# Patient Record
Sex: Female | Born: 1940
Health system: Southern US, Community
[De-identification: ages and names within clinical notes are randomized; demographics above are authoritative.]

## PROBLEM LIST (undated history)

## (undated) DIAGNOSIS — R51 Headache: Secondary | ICD-10-CM

## (undated) DIAGNOSIS — Z9289 Personal history of other medical treatment: Secondary | ICD-10-CM

## (undated) DIAGNOSIS — T8859XA Other complications of anesthesia, initial encounter: Secondary | ICD-10-CM

## (undated) DIAGNOSIS — K649 Unspecified hemorrhoids: Secondary | ICD-10-CM

## (undated) DIAGNOSIS — I4891 Unspecified atrial fibrillation: Secondary | ICD-10-CM

## (undated) DIAGNOSIS — K449 Diaphragmatic hernia without obstruction or gangrene: Secondary | ICD-10-CM

## (undated) DIAGNOSIS — T753XXA Motion sickness, initial encounter: Secondary | ICD-10-CM

## (undated) DIAGNOSIS — F32A Depression, unspecified: Secondary | ICD-10-CM

## (undated) DIAGNOSIS — M8580 Other specified disorders of bone density and structure, unspecified site: Secondary | ICD-10-CM

## (undated) DIAGNOSIS — M1990 Unspecified osteoarthritis, unspecified site: Secondary | ICD-10-CM

## (undated) DIAGNOSIS — I5030 Unspecified diastolic (congestive) heart failure: Secondary | ICD-10-CM

## (undated) DIAGNOSIS — I499 Cardiac arrhythmia, unspecified: Secondary | ICD-10-CM

## (undated) DIAGNOSIS — Z974 Presence of external hearing-aid: Secondary | ICD-10-CM

## (undated) DIAGNOSIS — Z8669 Personal history of other diseases of the nervous system and sense organs: Secondary | ICD-10-CM

## (undated) DIAGNOSIS — Z972 Presence of dental prosthetic device (complete) (partial): Secondary | ICD-10-CM

## (undated) DIAGNOSIS — E7800 Pure hypercholesterolemia, unspecified: Secondary | ICD-10-CM

## (undated) DIAGNOSIS — N200 Calculus of kidney: Secondary | ICD-10-CM

## (undated) DIAGNOSIS — R112 Nausea with vomiting, unspecified: Secondary | ICD-10-CM

## (undated) DIAGNOSIS — Z8489 Family history of other specified conditions: Secondary | ICD-10-CM

## (undated) DIAGNOSIS — H409 Unspecified glaucoma: Secondary | ICD-10-CM

## (undated) DIAGNOSIS — F329 Major depressive disorder, single episode, unspecified: Secondary | ICD-10-CM

## (undated) DIAGNOSIS — Z9889 Other specified postprocedural states: Secondary | ICD-10-CM

## (undated) DIAGNOSIS — R519 Headache, unspecified: Secondary | ICD-10-CM

## (undated) DIAGNOSIS — G629 Polyneuropathy, unspecified: Secondary | ICD-10-CM

## (undated) DIAGNOSIS — Z8614 Personal history of Methicillin resistant Staphylococcus aureus infection: Secondary | ICD-10-CM

## (undated) DIAGNOSIS — Z87442 Personal history of urinary calculi: Secondary | ICD-10-CM

## (undated) DIAGNOSIS — T4145XA Adverse effect of unspecified anesthetic, initial encounter: Secondary | ICD-10-CM

## (undated) HISTORY — PX: CATARACT EXTRACTION, BILATERAL: SHX1313

## (undated) HISTORY — PX: EYE SURGERY: SHX253

## (undated) HISTORY — PX: ABDOMINAL HYSTERECTOMY: SHX81

## (undated) HISTORY — DX: Calculus of kidney: N20.0

## (undated) HISTORY — DX: Unspecified hemorrhoids: K64.9

## (undated) HISTORY — PX: FOOT SURGERY: SHX648

## (undated) HISTORY — DX: Other specified disorders of bone density and structure, unspecified site: M85.80

## (undated) HISTORY — PX: NASAL SEPTUM SURGERY: SHX37

## (undated) HISTORY — DX: Unspecified glaucoma: H40.9

## (undated) HISTORY — DX: Unspecified diastolic (congestive) heart failure: I50.30

## (undated) HISTORY — PX: TOE SURGERY: SHX1073

## (undated) HISTORY — DX: Diaphragmatic hernia without obstruction or gangrene: K44.9

## (undated) HISTORY — DX: Depression, unspecified: F32.A

## (undated) HISTORY — DX: Unspecified atrial fibrillation: I48.91

## (undated) HISTORY — DX: Unspecified osteoarthritis, unspecified site: M19.90

## (undated) HISTORY — DX: Personal history of other diseases of the nervous system and sense organs: Z86.69

## (undated) HISTORY — PX: JOINT REPLACEMENT: SHX530

## (undated) HISTORY — PX: SHOULDER SURGERY: SHX246

## (undated) HISTORY — PX: REPLACEMENT TOTAL KNEE: SUR1224

## (undated) HISTORY — DX: Major depressive disorder, single episode, unspecified: F32.9

## (undated) HISTORY — DX: Pure hypercholesterolemia, unspecified: E78.00

## (undated) HISTORY — DX: Personal history of other medical treatment: Z92.89

---

## 2000-02-08 ENCOUNTER — Other Ambulatory Visit: Admission: RE | Admit: 2000-02-08 | Discharge: 2000-02-08 | Payer: Self-pay | Admitting: Otolaryngology

## 2000-05-16 ENCOUNTER — Ambulatory Visit (HOSPITAL_COMMUNITY): Admission: RE | Admit: 2000-05-16 | Discharge: 2000-05-16 | Payer: Self-pay | Admitting: Neurosurgery

## 2000-05-16 ENCOUNTER — Encounter: Payer: Self-pay | Admitting: Neurosurgery

## 2000-10-06 ENCOUNTER — Inpatient Hospital Stay (HOSPITAL_COMMUNITY): Admission: RE | Admit: 2000-10-06 | Discharge: 2000-10-07 | Payer: Self-pay | Admitting: Neurosurgery

## 2000-10-06 ENCOUNTER — Encounter: Payer: Self-pay | Admitting: Neurosurgery

## 2000-11-24 ENCOUNTER — Ambulatory Visit (HOSPITAL_COMMUNITY): Admission: RE | Admit: 2000-11-24 | Discharge: 2000-11-24 | Payer: Self-pay | Admitting: Neurosurgery

## 2000-11-24 ENCOUNTER — Encounter: Payer: Self-pay | Admitting: Neurosurgery

## 2001-08-03 ENCOUNTER — Other Ambulatory Visit: Admission: RE | Admit: 2001-08-03 | Discharge: 2001-08-03 | Payer: Self-pay | Admitting: Family Medicine

## 2002-12-06 HISTORY — PX: AUGMENTATION MAMMAPLASTY: SUR837

## 2004-09-09 ENCOUNTER — Ambulatory Visit: Payer: Self-pay

## 2004-11-05 ENCOUNTER — Ambulatory Visit: Payer: Self-pay | Admitting: General Practice

## 2004-12-02 ENCOUNTER — Ambulatory Visit: Payer: Self-pay

## 2005-12-31 ENCOUNTER — Ambulatory Visit: Payer: Self-pay | Admitting: Unknown Physician Specialty

## 2006-01-19 ENCOUNTER — Ambulatory Visit: Payer: Self-pay | Admitting: Internal Medicine

## 2006-02-01 ENCOUNTER — Ambulatory Visit: Payer: Self-pay | Admitting: Unknown Physician Specialty

## 2006-03-04 ENCOUNTER — Ambulatory Visit: Payer: Self-pay

## 2006-06-20 ENCOUNTER — Ambulatory Visit: Payer: Self-pay | Admitting: General Practice

## 2007-03-08 ENCOUNTER — Ambulatory Visit: Payer: Self-pay

## 2007-08-08 ENCOUNTER — Other Ambulatory Visit: Payer: Self-pay

## 2007-08-08 ENCOUNTER — Ambulatory Visit: Payer: Self-pay | Admitting: General Practice

## 2007-08-30 ENCOUNTER — Inpatient Hospital Stay: Payer: Self-pay | Admitting: General Practice

## 2008-03-11 ENCOUNTER — Ambulatory Visit: Payer: Self-pay

## 2009-03-25 ENCOUNTER — Ambulatory Visit: Payer: Self-pay

## 2009-12-29 ENCOUNTER — Ambulatory Visit: Payer: Self-pay | Admitting: Gastroenterology

## 2009-12-29 HISTORY — PX: COLONOSCOPY: SHX174

## 2009-12-29 LAB — HM COLONOSCOPY: HM Colonoscopy: NORMAL

## 2010-01-21 ENCOUNTER — Ambulatory Visit: Payer: Self-pay | Admitting: Family Medicine

## 2010-04-01 ENCOUNTER — Ambulatory Visit: Payer: Self-pay | Admitting: Family Medicine

## 2011-04-15 ENCOUNTER — Ambulatory Visit: Payer: Self-pay | Admitting: Family Medicine

## 2011-08-27 ENCOUNTER — Ambulatory Visit: Payer: Self-pay

## 2011-09-02 ENCOUNTER — Ambulatory Visit: Payer: Self-pay

## 2011-12-14 ENCOUNTER — Ambulatory Visit: Payer: Self-pay | Admitting: General Practice

## 2011-12-14 LAB — PROTIME-INR
INR: 0.9
Prothrombin Time: 12.6 secs (ref 11.5–14.7)

## 2011-12-14 LAB — URINALYSIS, COMPLETE
Bilirubin,UR: NEGATIVE
Blood: NEGATIVE
Glucose,UR: NEGATIVE mg/dL (ref 0–75)
Ketone: NEGATIVE
Leukocyte Esterase: NEGATIVE
Nitrite: NEGATIVE
Ph: 6 (ref 4.5–8.0)
Protein: NEGATIVE
RBC,UR: <1 /HPF (ref 0–5)
Specific Gravity: 1.016 (ref 1.003–1.030)
Squamous Epithelial: 13
WBC UR: <1 /HPF (ref 0–5)

## 2011-12-14 LAB — SEDIMENTATION RATE: Erythrocyte Sed Rate: 12 mm/hr (ref 0–30)

## 2011-12-14 LAB — BASIC METABOLIC PANEL
Anion Gap: 7 (ref 7–16)
BUN: 15 mg/dL (ref 7–18)
Calcium, Total: 9.2 mg/dL (ref 8.5–10.1)
Chloride: 104 mmol/L (ref 98–107)
Co2: 28 mmol/L (ref 21–32)
Creatinine: 0.61 mg/dL (ref 0.60–1.30)
EGFR (African American): >60
EGFR (Non-African Amer.): >60
Glucose: 86 mg/dL (ref 65–99)
Osmolality: 278 (ref 275–301)
Potassium: 4.6 mmol/L (ref 3.5–5.1)
Sodium: 139 mmol/L (ref 136–145)

## 2011-12-14 LAB — APTT: Activated PTT: 25.9 secs (ref 23.6–35.9)

## 2011-12-14 LAB — CBC
HCT: 40.8 % (ref 35.0–47.0)
HGB: 13.7 g/dL (ref 12.0–16.0)
MCH: 31.6 pg (ref 26.0–34.0)
MCHC: 33.5 g/dL (ref 32.0–36.0)
MCV: 94 fL (ref 80–100)
Platelet: 230 10*3/uL (ref 150–440)
RBC: 4.33 10*6/uL (ref 3.80–5.20)
RDW: 12.9 % (ref 11.5–14.5)
WBC: 5.6 10*3/uL (ref 3.6–11.0)

## 2011-12-14 LAB — MRSA PCR SCREENING

## 2011-12-15 LAB — URINE CULTURE

## 2011-12-27 ENCOUNTER — Inpatient Hospital Stay: Payer: Self-pay | Admitting: General Practice

## 2011-12-28 LAB — BASIC METABOLIC PANEL
Anion Gap: 9 (ref 7–16)
BUN: 10 mg/dL (ref 7–18)
Calcium, Total: 7.8 mg/dL — ABNORMAL LOW (ref 8.5–10.1)
Chloride: 105 mmol/L (ref 98–107)
Co2: 27 mmol/L (ref 21–32)
Creatinine: 0.56 mg/dL — ABNORMAL LOW (ref 0.60–1.30)
EGFR (African American): >60
EGFR (Non-African Amer.): >60
Glucose: 120 mg/dL — ABNORMAL HIGH (ref 65–99)
Osmolality: 281 (ref 275–301)
Potassium: 3.9 mmol/L (ref 3.5–5.1)
Sodium: 141 mmol/L (ref 136–145)

## 2011-12-28 LAB — PLATELET COUNT: Platelet: 154 10*3/uL (ref 150–440)

## 2011-12-28 LAB — HEMOGLOBIN: HGB: 11.6 g/dL — ABNORMAL LOW (ref 12.0–16.0)

## 2011-12-29 LAB — HEMOGLOBIN: HGB: 11.3 g/dL — ABNORMAL LOW (ref 12.0–16.0)

## 2011-12-29 LAB — BASIC METABOLIC PANEL
Anion Gap: 9 (ref 7–16)
BUN: 5 mg/dL — ABNORMAL LOW (ref 7–18)
Calcium, Total: 8 mg/dL — ABNORMAL LOW (ref 8.5–10.1)
Chloride: 108 mmol/L — ABNORMAL HIGH (ref 98–107)
Co2: 26 mmol/L (ref 21–32)
Creatinine: 0.57 mg/dL — ABNORMAL LOW (ref 0.60–1.30)
EGFR (African American): >60
EGFR (Non-African Amer.): >60
Glucose: 102 mg/dL — ABNORMAL HIGH (ref 65–99)
Osmolality: 282 (ref 275–301)
Potassium: 3.6 mmol/L (ref 3.5–5.1)
Sodium: 143 mmol/L (ref 136–145)

## 2011-12-29 LAB — PLATELET COUNT: Platelet: 145 10*3/uL — ABNORMAL LOW (ref 150–440)

## 2012-02-01 ENCOUNTER — Ambulatory Visit: Payer: Self-pay | Admitting: Family Medicine

## 2012-04-18 ENCOUNTER — Ambulatory Visit: Payer: Self-pay | Admitting: Family Medicine

## 2013-04-19 ENCOUNTER — Ambulatory Visit: Payer: Self-pay | Admitting: Family Medicine

## 2013-07-05 ENCOUNTER — Encounter: Payer: Self-pay | Admitting: Cardiovascular Disease

## 2013-07-05 ENCOUNTER — Encounter: Payer: Self-pay | Admitting: *Deleted

## 2013-07-05 ENCOUNTER — Ambulatory Visit (INDEPENDENT_AMBULATORY_CARE_PROVIDER_SITE_OTHER): Payer: Medicare HMO | Admitting: Cardiovascular Disease

## 2013-07-05 VITALS — BP 168/92 | HR 55 | Ht 68.0 in | Wt 191.5 lb

## 2013-07-05 DIAGNOSIS — I499 Cardiac arrhythmia, unspecified: Secondary | ICD-10-CM

## 2013-07-05 DIAGNOSIS — R002 Palpitations: Secondary | ICD-10-CM | POA: Insufficient documentation

## 2013-07-05 NOTE — Progress Notes (Signed)
HPI  This is a pleasant 71 year old Caucasian female who is self-referred for evaluation of palpitations and irregular heartbeats. She is not aware of any previous cardiac history. She has no history of hypertension. She reports borderline diabetes and hyperlipidemia not requiring medications. She smoked in the past more than 30 years ago. There is no family history of coronary artery disease or arrhythmia.  Over the last few months, she has experienced palpitations described as skipped beats most noticeable at night when she is trying to sleep. She does not get these symptoms during the day when she is active. This has not been associated with dizziness, syncope or presyncope. She denies any chest pain or dyspnea. She was told at the ophthalmologist office that her heart rate was irregular. She is very active and still works full time with her insurance company. She drinks one can Pepsi occasionally. She does not consume other caffeinated products. She is not aware of thyroid problems.   Allergies  Allergen Reactions  . [name] (Moxifloxacin Hcl In Nacl)   . Keflex (Cephalexin)   . Mobic (Meloxicam)   . Vicodin (Hydrocodone-Acetaminophen)     Sick on stomach     No current outpatient prescriptions on file prior to visit.   No current facility-administered medications on file prior to visit.     Past Medical History  Diagnosis Date  . Arthritis   . Depression   . Hemorrhoids   . History of migraine headaches   . Glaucoma     left eye     Past Surgical History  Procedure Laterality Date  . Replacement total knee      bilateral  . Shoulder surgery      right shoulder  . Abdominal hysterectomy    . Foot surgery      bilateral   . Nasal septum surgery       Family History  Problem Relation Age of Onset  . Family history unknown: Yes     History   Social History  . Marital Status: Married    Spouse Name: N/A    Number of Children: N/A  . Years of Education:  N/A   Occupational History  . Not on file.   Social History Main Topics  . Smoking status: Former Smoker -- 1.00 packs/day for 30 years    Types: Cigarettes  . Smokeless tobacco: Not on file  . Alcohol Use: No  . Drug Use: No  . Sexually Active: Not on file   Other Topics Concern  . Not on file   Social History Narrative  . No narrative on file     ROS Constitutional: Negative for fever, chills, diaphoresis, activity change, appetite change and fatigue.  HENT: Negative for hearing loss, nosebleeds, congestion, sore throat, facial swelling, drooling, trouble swallowing, neck pain, voice change, sinus pressure and tinnitus.  Eyes: Negative for photophobia, pain, discharge and visual disturbance.  Respiratory: Negative for apnea, cough, chest tightness, shortness of breath and wheezing.  Cardiovascular: Negative for chest pain  and leg swelling.  Gastrointestinal: Negative for nausea, vomiting, abdominal pain, diarrhea, constipation, blood in stool and abdominal distention.  Genitourinary: Negative for dysuria, urgency, frequency, hematuria and decreased urine volume.  Musculoskeletal: Negative for myalgias, back pain, joint swelling, arthralgias and gait problem.  Skin: Negative for color change, pallor, rash and wound.  Neurological: Negative for dizziness, tremors, seizures, syncope, speech difficulty, weakness, light-headedness, numbness and headaches.  Psychiatric/Behavioral: Negative for suicidal ideas, hallucinations, behavioral problems and agitation. The  patient is not nervous/anxious.     PHYSICAL EXAM   BP 168/92  Pulse 55  Ht 5\' 8"  (1.727 m)  Wt 191 lb 8 oz (86.864 kg)  BMI 29.12 kg/m2 Constitutional: She is oriented to person, place, and time. She appears well-developed and well-nourished. No distress.  HENT: No nasal discharge.  Head: Normocephalic and atraumatic.  Eyes: Pupils are equal and round. Right eye exhibits no discharge. Left eye exhibits no  discharge.  Neck: Normal range of motion. Neck supple. No JVD present. No thyromegaly present.  Cardiovascular: Normal rate, regular rhythm, normal heart sounds. Exam reveals no gallop and no friction rub. No murmur heard.  Pulmonary/Chest: Effort normal and breath sounds normal. No stridor. No respiratory distress. She has no wheezes. She has no rales. She exhibits no tenderness.  Abdominal: Soft. Bowel sounds are normal. She exhibits no distension. There is no tenderness. There is no rebound and no guarding.  Musculoskeletal: Normal range of motion. She exhibits no edema and no tenderness.  Neurological: She is alert and oriented to person, place, and time. Coordination normal.  Skin: Skin is warm and dry. No rash noted. She is not diaphoretic. No erythema. No pallor.  Psychiatric: She has a normal mood and affect. Her behavior is normal. Judgment and thought content normal.     UJW:[id number]  Bradycardia  Low voltage -possible pulmonary disease.   ABNORMAL    ASSESSMENT AND PLAN

## 2013-07-05 NOTE — Patient Instructions (Addendum)
Labs today.   Your physician has recommended that you wear a holter monitor. Holter monitors are medical devices that record the heart's electrical activity. Doctors most often use these monitors to diagnose arrhythmias. Arrhythmias are problems with the speed or rhythm of the heartbeat. The monitor is a small, portable device. You can wear one while you do your normal daily activities. This is usually used to diagnose what is causing palpitations/syncope (passing out).  Follow up as needed.

## 2013-07-05 NOTE — Assessment & Plan Note (Signed)
Her symptoms are suggestive of premature beats. She has no symptoms suggestive of angina. Physical exam does not reveal any cardiac murmurs or abnormal sounds. Baseline ECG is normal except for mild sinus bradycardia. She does not consume excessive amounts of caffeine. I recommend a 48 hour Holter monitor to ensure no other associated arrhythmia such as atrial fibrillation. I will also check routine labs including basic metabolic profile, TSH and magnesium. Followup as needed based on the results of the above. Overall, I don't think her symptoms are bad enough to warrant treatment with medication especially with her baseline sinus bradycardia.

## 2013-07-06 LAB — TSH: TSH: 1.34 u[IU]/mL (ref 0.450–4.500)

## 2013-07-06 LAB — BASIC METABOLIC PANEL
BUN/Creatinine Ratio: 21 (ref 11–26)
BUN: 15 mg/dL (ref 8–27)
CO2: 26 mmol/L (ref 18–29)
Calcium: 9.7 mg/dL (ref 8.6–10.2)
Chloride: 104 mmol/L (ref 97–108)
Creatinine, Ser: 0.71 mg/dL (ref 0.57–1.00)
GFR calc Af Amer: 99 mL/min/{1.73_m2} (ref >59)
GFR calc non Af Amer: 86 mL/min/{1.73_m2} (ref >59)
Glucose: 88 mg/dL (ref 65–99)
Potassium: 5.3 mmol/L — ABNORMAL HIGH (ref 3.5–5.2)
Sodium: 140 mmol/L (ref 134–144)

## 2013-07-06 LAB — MAGNESIUM: Magnesium: 2.1 mg/dL (ref 1.6–2.6)

## 2013-07-09 NOTE — Progress Notes (Signed)
Pt informed of lab result

## 2013-07-11 ENCOUNTER — Other Ambulatory Visit: Payer: Self-pay

## 2013-07-19 ENCOUNTER — Telehealth: Payer: Self-pay

## 2013-07-19 NOTE — Telephone Encounter (Signed)
Returned call to pt advised Dr Kirke Corin is out of town this week, will return next week.  Holter monitor results are on his desk.  Will have him review after he returns and call her the first of next week with results of holter monitor.  Pt is agreeable to this.

## 2013-07-19 NOTE — Telephone Encounter (Signed)
Pt would like holter results. 

## 2013-07-23 NOTE — Telephone Encounter (Signed)
Holter looks fine with NSR. Only short run of SVT (few seconds). Not long enough to warrant treatment. Follow up as needed if symptoms worsen.

## 2013-07-24 NOTE — Telephone Encounter (Signed)
Returned call to pt advised per Dr Kirke Corin holter monitor looked fine NSR with a short run of SVT.  No treatment warranted at this time.  Follow-up prn if symptoms worsen.  Pt states palpitations seem to be getting better and happening with less frequency.  Pt will continue to monitor.

## 2013-07-25 ENCOUNTER — Encounter (INDEPENDENT_AMBULATORY_CARE_PROVIDER_SITE_OTHER): Payer: Medicare HMO

## 2013-07-25 ENCOUNTER — Other Ambulatory Visit: Payer: Self-pay

## 2013-07-25 DIAGNOSIS — I499 Cardiac arrhythmia, unspecified: Secondary | ICD-10-CM

## 2013-08-10 DIAGNOSIS — J329 Chronic sinusitis, unspecified: Secondary | ICD-10-CM | POA: Insufficient documentation

## 2013-08-10 DIAGNOSIS — R112 Nausea with vomiting, unspecified: Secondary | ICD-10-CM | POA: Insufficient documentation

## 2013-08-10 DIAGNOSIS — M1990 Unspecified osteoarthritis, unspecified site: Secondary | ICD-10-CM | POA: Insufficient documentation

## 2013-08-10 DIAGNOSIS — H409 Unspecified glaucoma: Secondary | ICD-10-CM | POA: Insufficient documentation

## 2013-10-11 ENCOUNTER — Other Ambulatory Visit: Payer: Self-pay

## 2014-02-22 ENCOUNTER — Encounter: Payer: Self-pay | Admitting: Podiatry

## 2014-02-22 ENCOUNTER — Ambulatory Visit (INDEPENDENT_AMBULATORY_CARE_PROVIDER_SITE_OTHER): Payer: Medicare HMO | Admitting: Podiatry

## 2014-02-22 VITALS — Ht 68.0 in | Wt 180.0 lb

## 2014-02-22 DIAGNOSIS — Q828 Other specified congenital malformations of skin: Secondary | ICD-10-CM

## 2014-02-22 NOTE — Progress Notes (Signed)
Subjective:     Patient ID: Teresa Harris, female   DOB: 03-Mar-1941, 73 y.o.   MRN: [id number]  HPI patient presents with plantar calluses on both feet that become tender   Review of Systems     Objective:   Physical Exam Neurovascular status intact with keratotic lesions plantar aspect of both feet    Assessment:     Porokeratotic type lesions plantar aspect both feet    Plan:     Debridement lesions on both feet with no bleeding noted

## 2014-02-22 NOTE — Progress Notes (Signed)
Trim those places

## 2014-05-06 ENCOUNTER — Ambulatory Visit: Payer: Self-pay | Admitting: Family Medicine

## 2014-05-06 LAB — HM MAMMOGRAPHY: HM Mammogram: NORMAL

## 2014-07-16 DIAGNOSIS — M7060 Trochanteric bursitis, unspecified hip: Secondary | ICD-10-CM | POA: Insufficient documentation

## 2014-07-16 DIAGNOSIS — M5416 Radiculopathy, lumbar region: Secondary | ICD-10-CM | POA: Insufficient documentation

## 2014-08-09 ENCOUNTER — Ambulatory Visit (INDEPENDENT_AMBULATORY_CARE_PROVIDER_SITE_OTHER): Payer: Medicare HMO | Admitting: Podiatry

## 2014-08-09 DIAGNOSIS — Q828 Other specified congenital malformations of skin: Secondary | ICD-10-CM

## 2014-08-12 NOTE — Progress Notes (Signed)
Subjective:     Patient ID: Teresa Harris, female   DOB: 1941-08-30, 73 y.o.   MRN: [id number]  HPI patient presents with calluses on both feet that are painful   Review of Systems     Objective:   Physical Exam Neurovascular status unchanged with keratotic lesions of both feet    Assessment:     Chronic lesion formation with pain    Plan:     Debridement lesions on both feet with no bleeding noted

## 2014-10-01 ENCOUNTER — Ambulatory Visit: Payer: Self-pay | Admitting: Family Medicine

## 2014-10-01 DIAGNOSIS — I4821 Permanent atrial fibrillation: Secondary | ICD-10-CM

## 2014-10-01 HISTORY — DX: Permanent atrial fibrillation: I48.21

## 2014-10-03 ENCOUNTER — Ambulatory Visit (INDEPENDENT_AMBULATORY_CARE_PROVIDER_SITE_OTHER): Payer: Medicare HMO | Admitting: Cardiovascular Disease

## 2014-10-03 ENCOUNTER — Encounter: Payer: Self-pay | Admitting: Cardiovascular Disease

## 2014-10-03 ENCOUNTER — Telehealth: Payer: Self-pay | Admitting: Cardiovascular Disease

## 2014-10-03 VITALS — BP 102/80 | HR 107 | Ht 68.0 in | Wt 193.8 lb

## 2014-10-03 DIAGNOSIS — R079 Chest pain, unspecified: Secondary | ICD-10-CM

## 2014-10-03 DIAGNOSIS — I4891 Unspecified atrial fibrillation: Secondary | ICD-10-CM

## 2014-10-03 DIAGNOSIS — K649 Unspecified hemorrhoids: Secondary | ICD-10-CM

## 2014-10-03 MED ORDER — DIGOXIN 125 MCG PO TABS: 0.1250 mg | ORAL_TABLET | Freq: Every day | ORAL | Status: DC

## 2014-10-03 NOTE — Assessment & Plan Note (Signed)
She is having substernal tightness with activities which is concerning for angina. This could be due to atrial fibrillation with rapid ventricular response. However, underlying obstructive coronary artery disease cannot be excluded. I requested a pharmacologic nuclear stress test.

## 2014-10-03 NOTE — Patient Instructions (Addendum)
Your physician has recommended you make the following change in your medication:  Start Digoxin 0.125 mg once daily   Your physician has requested that you have an echocardiogram. Echocardiography is a painless test that uses sound waves to create images of your heart. It provides your doctor with information about the size and shape of your heart and how well your heart's chambers and valves are working. This procedure takes approximately one hour. There are no restrictions for this procedure.  [place]  Your caregiver has ordered a Stress Test with nuclear imaging. The purpose of this test is to evaluate the blood supply to your heart muscle. This procedure is referred to as a "Non-Invasive Stress Test." This is because other than having an IV started in your vein, nothing is inserted or "invades" your body. Cardiac stress tests are done to find areas of poor blood flow to the heart by determining the extent of coronary artery disease (CAD). Some patients exercise on a treadmill, which naturally increases the blood flow to your heart, while others who are  unable to walk on a treadmill due to physical limitations have a pharmacologic/chemical stress agent called Lexiscan . This medicine will mimic walking on a treadmill by temporarily increasing your coronary blood flow.   Please note: these test may take anywhere between 2-4 hours to complete  PLEASE REPORT TO [facility] AT THE FIRST DESK WILL DIRECT YOU WHERE TO GO  Date of Procedure:_______11/02/15______________________________  Arrival Time for Procedure:________0745am______________________  PLEASE NOTIFY THE OFFICE AT LEAST 24 HOURS IN ADVANCE IF YOU ARE UNABLE TO KEEP YOUR APPOINTMENT.  [phone] AND  PLEASE NOTIFY NUCLEAR MEDICINE AT [facility] AT LEAST 24 HOURS IN ADVANCE IF YOU ARE UNABLE TO KEEP YOUR APPOINTMENT. [phone]  How to prepare for your Myoview test:  1. Do not eat or drink after  midnight 2. No caffeine for 24 hours prior to test 3. No smoking 24 hours prior to test. 4. Your medication may be taken with water.  If your doctor stopped a medication because of this test, do not take that medication. 5. Ladies, please do not wear dresses.  Skirts or pants are appropriate. Please wear a short sleeve shirt. 6. No perfume, cologne or lotion. 7. Wear comfortable walking shoes. No heels!       Your physician recommends that you schedule a follow-up appointment in:  1 month with Dr. Zannie Cove have been referred to general surgery  We will call you with appointment date and time     Your next appointment will be scheduled in our new office located at :  [facility]  [address] [place]  [place], [place] [address]

## 2014-10-03 NOTE — Assessment & Plan Note (Signed)
I'm referring her to surgery to treat her bleeding hemorrhoids so that anticoagulation can be started.

## 2014-10-03 NOTE — Progress Notes (Signed)
Primary care provider: Maurine Minister, FNP  HPI  This is a pleasant 73 year old Caucasian female who is here today for a follow-up visit as she was recently diagnosed with atrial fibrillation. I saw her last year for palpitations .  She has no history of hypertension. She reports borderline diabetes and hyperlipidemia not requiring medications. She smoked in the past more than 30 years ago. There is no family history of coronary artery disease or arrhythmia.  She had a Holter monitor done which showed PACs without significant arrhythmia. She reports significant worsening of palpitations over the last 6 months. She feels tachycardia with significant shortness of breath and substernal chest tightness. She has been having symptoms of upper respiratory tract infection and was started on antibiotics. She was found to be in atrial fibrillation recently with a heart rate of 102 bpm. She was started on small dose Toprol. She feels slightly better but continues to have palpitations, dyspnea and chest pain with activities.   Allergies  Allergen Reactions  . [name] [Moxifloxacin Hcl In Nacl]   . Bimatoprost     Other reaction(s): Other (See Comments) Made my eyes red, and hurt  . Brimonidine     Other reaction(s): Other (See Comments) Made my eyes red, and hurt  . Keflex [Cephalexin]     headache  . Mobic [Meloxicam] Other (See Comments)    headache  . Propofol     Other reaction(s): Other (See Comments) Had difficulty waking up  . Travoprost   . Vicodin [Hydrocodone-Acetaminophen] Nausea And Vomiting    Sick on stomach Sick on stomach     Current Outpatient Prescriptions on File Prior to Visit  Medication Sig Dispense Refill  . aspirin 81 MG tablet Take 81 mg by mouth daily.      Marland Kitchen atropine 1 % ophthalmic solution Administer 1 drop into the left eye Two (2) times a day.      . calcium carbonate (OS-CAL) 600 MG TABS Take 600 mg by mouth daily with breakfast.       . celecoxib (CELEBREX) 200  MG capsule Take 200 mg by mouth daily.       . dorzolamide-timolol (COSOPT) 22.3-6.8 MG/ML ophthalmic solution Apply 1 drop to eye 2 (two) times daily.      Marland Kitchen estrogens, conjugated, (PREMARIN) 0.625 MG tablet Take 0.625 mg by mouth daily. Take daily for 21 days then do not take for 7 days.      . Magnesium 400 MG CAPS Take 400 mg by mouth 2 (two) times daily.      . Multiple Vitamin (MULTIVITAMIN) tablet Take 1 tablet by mouth daily.      . Omega-3 Fatty Acids (FISH OIL) 1200 MG CAPS Take 1,200 mg by mouth 2 (two) times daily.      Marland Kitchen omeprazole (PRILOSEC) 40 MG capsule Take 40 mg by mouth daily.       No current facility-administered medications on file prior to visit.     Past Medical History  Diagnosis Date  . Arthritis   . Depression   . Hemorrhoids   . History of migraine headaches   . Glaucoma     left eye  . Hypercholesteremia   . Hiatal hernia   . Kidney calculi   . Osteopenia      Past Surgical History  Procedure Laterality Date  . Replacement total knee      bilateral  . Shoulder surgery      right shoulder  . Abdominal hysterectomy    .  Foot surgery      bilateral   . Nasal septum surgery    . Colonoscopy    . Eye surgery       History reviewed. No pertinent family history.   History   Social History  . Marital Status: Divorced    Spouse Name: N/A    Number of Children: N/A  . Years of Education: N/A   Occupational History  . Not on file.   Social History Main Topics  . Smoking status: Former Smoker -- 1.00 packs/day for 30 years    Types: Cigarettes  . Smokeless tobacco: Not on file  . Alcohol Use: No  . Drug Use: No  . Sexual Activity: Not on file   Other Topics Concern  . Not on file   Social History Narrative  . No narrative on file     ROS A 10 point review of system was performed. It is negative other than that mentioned in the history of present illness.     PHYSICAL EXAM   BP 102/80  Pulse 107  Ht 5\' 8"  (1.727 m)   Wt 193 lb 12 oz (87.884 kg)  BMI 29.47 kg/m2 Constitutional: She is oriented to person, place, and time. She appears well-developed and well-nourished. No distress.  HENT: No nasal discharge.  Head: Normocephalic and atraumatic.  Eyes: Pupils are equal and round. Right eye exhibits no discharge. Left eye exhibits no discharge.  Neck: Normal range of motion. Neck supple. No JVD present. No thyromegaly present.  Cardiovascular: Tachycardic, irregular rhythm, normal heart sounds. Exam reveals no gallop and no friction rub. No murmur heard.  Pulmonary/Chest: Effort normal and breath sounds normal. No stridor. No respiratory distress. She has no wheezes. She has no rales. She exhibits no tenderness.  Abdominal: Soft. Bowel sounds are normal. She exhibits no distension. There is no tenderness. There is no rebound and no guarding.  Musculoskeletal: Normal range of motion. She exhibits no edema and no tenderness.  Neurological: She is alert and oriented to person, place, and time. Coordination normal.  Skin: Skin is warm and dry. No rash noted. She is not diaphoretic. No erythema. No pallor.  Psychiatric: She has a normal mood and affect. Her behavior is normal. Judgment and thought content normal.     TJQ:[id number] fibrillation  -  Nonspecific T-abnormality.   ABNORMAL    ASSESSMENT AND PLAN

## 2014-10-03 NOTE — Telephone Encounter (Signed)
Pt is calling to see if she is okay to take, "celebrax" please call pt back.

## 2014-10-03 NOTE — Assessment & Plan Note (Signed)
The patient is now in atrial fibrillation which is likely that cause of her dyspnea and shortness of breath. Ventricular rate is still not well-controlled. Blood pressure is too low to allow increasing the dose of Toprol. Thus, I added small dose digoxin 0.125 mg once daily. I requested an echocardiogram to evaluate her dyspnea. CHADS2 VASc score is 2 or 3. Thus, long-term anticoagulation is recommended. However, she reports bleeding from hemorrhoids which is a daily occurrence. Thus, I want this to be evaluated before starting anticoagulation. Once the patient is able to take blood thinners, we can proceed with cardioversion after 3 weeks of anticoagulation.

## 2014-10-04 ENCOUNTER — Institutional Professional Consult (permissible substitution): Payer: Medicare HMO | Admitting: Cardiovascular Disease

## 2014-10-04 NOTE — Telephone Encounter (Signed)
Informed patient of Dr. Jacklynn Ganong response  Patient verbalized understanding

## 2014-10-04 NOTE — Telephone Encounter (Signed)
That is fine for now but once we start anticoagulation, she should avoid it.

## 2014-10-04 NOTE — Telephone Encounter (Signed)
LVM 10/30

## 2014-10-07 ENCOUNTER — Ambulatory Visit: Payer: Self-pay | Admitting: Cardiovascular Disease

## 2014-10-07 DIAGNOSIS — R079 Chest pain, unspecified: Secondary | ICD-10-CM

## 2014-10-08 ENCOUNTER — Other Ambulatory Visit: Payer: Self-pay | Admitting: *Deleted

## 2014-10-08 DIAGNOSIS — R079 Chest pain, unspecified: Secondary | ICD-10-CM

## 2014-10-22 ENCOUNTER — Other Ambulatory Visit: Payer: Self-pay

## 2014-10-22 ENCOUNTER — Other Ambulatory Visit (INDEPENDENT_AMBULATORY_CARE_PROVIDER_SITE_OTHER): Payer: Medicare HMO

## 2014-10-22 ENCOUNTER — Telehealth: Payer: Self-pay | Admitting: *Deleted

## 2014-10-22 DIAGNOSIS — I4891 Unspecified atrial fibrillation: Secondary | ICD-10-CM

## 2014-10-22 DIAGNOSIS — R079 Chest pain, unspecified: Secondary | ICD-10-CM

## 2014-10-22 DIAGNOSIS — R0602 Shortness of breath: Secondary | ICD-10-CM

## 2014-10-22 NOTE — Telephone Encounter (Signed)
Patient would like stress test results.  

## 2014-10-23 NOTE — Telephone Encounter (Signed)
Normal stress test

## 2014-10-24 NOTE — Telephone Encounter (Signed)
LVM 1119.

## 2014-10-25 DIAGNOSIS — M5442 Lumbago with sciatica, left side: Secondary | ICD-10-CM | POA: Insufficient documentation

## 2014-10-25 NOTE — Telephone Encounter (Signed)
Reviewed results with patient. 

## 2014-11-04 ENCOUNTER — Ambulatory Visit (INDEPENDENT_AMBULATORY_CARE_PROVIDER_SITE_OTHER): Payer: Medicare HMO | Admitting: Cardiovascular Disease

## 2014-11-04 ENCOUNTER — Telehealth: Payer: Self-pay | Admitting: Cardiovascular Disease

## 2014-11-04 ENCOUNTER — Telehealth: Payer: Self-pay

## 2014-11-04 ENCOUNTER — Encounter: Payer: Self-pay | Admitting: Cardiovascular Disease

## 2014-11-04 ENCOUNTER — Encounter: Payer: Self-pay | Admitting: *Deleted

## 2014-11-04 VITALS — BP 114/70 | HR 80 | Ht 68.0 in | Wt 192.5 lb

## 2014-11-04 DIAGNOSIS — I4891 Unspecified atrial fibrillation: Secondary | ICD-10-CM

## 2014-11-04 DIAGNOSIS — K649 Unspecified hemorrhoids: Secondary | ICD-10-CM

## 2014-11-04 DIAGNOSIS — R0789 Other chest pain: Secondary | ICD-10-CM

## 2014-11-04 MED ORDER — APIXABAN 5 MG PO TABS: 5.0000 mg | ORAL_TABLET | Freq: Two times a day (BID) | ORAL | Status: DC

## 2014-11-04 NOTE — Progress Notes (Signed)
Primary care provider: Maurine Minister, FNP  HPI  This is a pleasant 73 year old Caucasian female who is here today for a follow-up regarding recently diagnosed atrial fibrillation.  She has borderline diabetes and hyperlipidemia not requiring medications. She smoked in the past more than 30 years ago. There is no family history of coronary artery disease or arrhythmia.  She was seen recently for palpitations and chest pain. She was found to be in atrial fibrillation. Heart rate was not well controlled on Toprol with borderline low blood pressure. Thus, I added digoxin. Anticoagulation was indicated. However, it was not started due to chronic problems of bleeding hemorrhoids. I referred her to surgery for evaluation. However, she did not get an appointment yet. She reports improvement in palpitations. She underwent a nuclear stress test which showed no evidence of ischemia with normal ejection fraction. Echocardiogram showed normal EF with mild to moderate mitral regurgitation and mildly dilated left atrium.   Allergies  Allergen Reactions  . [name] [Moxifloxacin Hcl In Nacl]   . Bimatoprost     Other reaction(s): Other (See Comments) Made my eyes red, and hurt  . Brimonidine     Other reaction(s): Other (See Comments) Made my eyes red, and hurt  . Keflex [Cephalexin]     headache  . Mobic [Meloxicam] Other (See Comments)    headache  . Propofol     Other reaction(s): Other (See Comments) Had difficulty waking up  . Travoprost   . Vicodin [Hydrocodone-Acetaminophen] Nausea And Vomiting    Sick on stomach Sick on stomach     Current Outpatient Prescriptions on File Prior to Visit  Medication Sig Dispense Refill  . aspirin 81 MG tablet Take 81 mg by mouth daily.    . calcium carbonate (OS-CAL) 600 MG TABS Take 600 mg by mouth daily with breakfast.     . celecoxib (CELEBREX) 200 MG capsule Take 200 mg by mouth daily.     . cycloSPORINE (RESTASIS) 0.05 % ophthalmic emulsion Place 1  drop into both eyes 2 (two) times daily.     . digoxin (LANOXIN) 0.125 MG tablet Take 1 tablet (0.125 mg total) by mouth daily. 30 tablet 6  . dorzolamide-timolol (COSOPT) 22.3-6.8 MG/ML ophthalmic solution Place 1 drop into the right eye 2 (two) times daily.     Marland Kitchen estrogens, conjugated, (PREMARIN) 0.625 MG tablet Take 0.625 mg by mouth daily. Take daily for 21 days then do not take for 7 days.    Marland Kitchen HYDROcodone-homatropine (HYCODAN) 5-1.5 MG/5ML syrup Take 5 mLs by mouth every 6 (six) hours as needed for cough.    . Magnesium 400 MG CAPS Take 400 mg by mouth 2 (two) times daily.    . metoprolol succinate (TOPROL-XL) 25 MG 24 hr tablet Take 25 mg by mouth daily.    . Multiple Vitamin (MULTIVITAMIN) tablet Take 1 tablet by mouth daily.    . Omega-3 Fatty Acids (FISH OIL) 1200 MG CAPS Take 1,200 mg by mouth 2 (two) times daily.    Marland Kitchen omeprazole (PRILOSEC) 40 MG capsule Take 40 mg by mouth daily.     No current facility-administered medications on file prior to visit.     Past Medical History  Diagnosis Date  . Arthritis   . Depression   . Hemorrhoids   . History of migraine headaches   . Glaucoma     left eye  . Hypercholesteremia   . Hiatal hernia   . Kidney calculi   . Osteopenia  Past Surgical History  Procedure Laterality Date  . Replacement total knee      bilateral  . Shoulder surgery      right shoulder  . Abdominal hysterectomy    . Foot surgery      bilateral   . Nasal septum surgery    . Colonoscopy    . Eye surgery       History reviewed. No pertinent family history.   History   Social History  . Marital Status: Divorced    Spouse Name: N/A    Number of Children: N/A  . Years of Education: N/A   Occupational History  . Not on file.   Social History Main Topics  . Smoking status: Former Smoker -- 1.00 packs/day for 30 years    Types: Cigarettes  . Smokeless tobacco: Not on file  . Alcohol Use: No  . Drug Use: No  . Sexual Activity: Not on  file   Other Topics Concern  . Not on file   Social History Narrative     ROS A 10 point review of system was performed. It is negative other than that mentioned in the history of present illness.     PHYSICAL EXAM   BP 114/70 mmHg  Pulse 80  Ht 5\' 8"  (1.727 m)  Wt 192 lb 8 oz (87.317 kg)  BMI 29.28 kg/m2 Constitutional: She is oriented to person, place, and time. She appears well-developed and well-nourished. No distress.  HENT: No nasal discharge.  Head: Normocephalic and atraumatic.  Eyes: Pupils are equal and round. Right eye exhibits no discharge. Left eye exhibits no discharge.  Neck: Normal range of motion. Neck supple. No JVD present. No thyromegaly present.  Cardiovascular: Tachycardic, irregular rhythm, normal heart sounds. Exam reveals no gallop and no friction rub. No murmur heard.  Pulmonary/Chest: Effort normal and breath sounds normal. No stridor. No respiratory distress. She has no wheezes. She has no rales. She exhibits no tenderness.  Abdominal: Soft. Bowel sounds are normal. She exhibits no distension. There is no tenderness. There is no rebound and no guarding.  Musculoskeletal: Normal range of motion. She exhibits no edema and no tenderness.  Neurological: She is alert and oriented to person, place, and time. Coordination normal.  Skin: Skin is warm and dry. No rash noted. She is not diaphoretic. No erythema. No pallor.  Psychiatric: She has a normal mood and affect. Her behavior is normal. Judgment and thought content normal.     EKG: Atrial fibrillation  -  Negative T-waves  -Possible  Inferior  ischemia.   Low voltage -possible pulmonary disease.   ABNORMAL     ASSESSMENT AND PLAN

## 2014-11-04 NOTE — Telephone Encounter (Signed)
Will forward to Dr Arida for review  

## 2014-11-04 NOTE — Patient Instructions (Signed)
Stop Aspirin 81 mg. Start Eliquis 5 mg Take 1 tablet by mouth Twice daily.   You are having CBC and Basic Metabolic drawn today. You will be contacted with results 24/48 hours.  F/u 1 month with Dr. Fletcher Anon. F/u with Dr. Tollie Pizza for hemorrhoids

## 2014-11-04 NOTE — Telephone Encounter (Signed)
Per pt request, left appt time on vm regarding appt time/date with Dr. Bary Castilla

## 2014-11-04 NOTE — Assessment & Plan Note (Signed)
I'm referring her again to surgery to address the hemorrhoid issue given that she is going to be on long-term anticoagulation.

## 2014-11-04 NOTE — Assessment & Plan Note (Signed)
This was likely due to A. fib with RVR. Nuclear stress test showed no evidence of ischemia.

## 2014-11-04 NOTE — Telephone Encounter (Signed)
Pharmacist called Regarding eliquis, new for pt, pt is on premarin, her system is flagging, increases risk of blood clots. Wanted to know if doctor knew. Please call pharmacy

## 2014-11-04 NOTE — Assessment & Plan Note (Signed)
Ventricular rate is well controlled now on small dose Toprol and digoxin. She reports fatigue with Toprol in spite of being a small dose. I might consider switching to diltiazem in the future. CHADS VASc score is   3. Thus, long-term anticoagulation is recommended. She was supposed to get evaluated by surgery for bleeding hemorrhoids but that has not happened yet. This has been a chronic issue for her. It appears that she tolerates aspirin okay. Thus, she might be fine with anticoagulation. This was discussed with her. I requested routine labs that include CBC and basic metabolic profile.   I stopped aspirin and started Eliquis. I will consider cardioversion in 3-4 weeks if she is tolerating anticoagulation.

## 2014-11-05 LAB — BASIC METABOLIC PANEL
BUN/Creatinine Ratio: 21 (ref 11–26)
BUN: 15 mg/dL (ref 8–27)
CO2: 19 mmol/L (ref 18–29)
Calcium: 9.3 mg/dL (ref 8.7–10.3)
Chloride: 104 mmol/L (ref 97–108)
Creatinine, Ser: 0.7 mg/dL (ref 0.57–1.00)
GFR calc Af Amer: 100 mL/min/{1.73_m2} (ref >59)
GFR calc non Af Amer: 87 mL/min/{1.73_m2} (ref >59)
Glucose: 153 mg/dL — ABNORMAL HIGH (ref 65–99)
Potassium: 4.5 mmol/L (ref 3.5–5.2)
Sodium: 142 mmol/L (ref 134–144)

## 2014-11-05 LAB — CBC WITH DIFFERENTIAL/PLATELET
Basophils Absolute: 0 10*3/uL (ref 0.0–0.2)
Basos: 0 %
Eos: 2 %
Eosinophils Absolute: 0.2 10*3/uL (ref 0.0–0.4)
HCT: 44.6 % (ref 34.0–46.6)
Hemoglobin: 15.2 g/dL (ref 11.1–15.9)
Immature Grans (Abs): 0 10*3/uL (ref 0.0–0.1)
Immature Granulocytes: 0 %
Lymphocytes Absolute: 2.2 10*3/uL (ref 0.7–3.1)
Lymphs: 30 %
MCH: 32.6 pg (ref 26.6–33.0)
MCHC: 34.1 g/dL (ref 31.5–35.7)
MCV: 96 fL (ref 79–97)
Monocytes Absolute: 0.5 10*3/uL (ref 0.1–0.9)
Monocytes: 7 %
Neutrophils Absolute: 4.6 10*3/uL (ref 1.4–7.0)
Neutrophils Relative %: 61 %
RBC: 4.66 x10E6/uL (ref 3.77–5.28)
RDW: 13.9 % (ref 12.3–15.4)
WBC: 7.4 10*3/uL (ref 3.4–10.8)

## 2014-11-05 NOTE — Telephone Encounter (Signed)
Informed Teresa Harris at [facility] drug of Dr. Jacklynn Ganong response  She verbalized understaning

## 2014-11-05 NOTE — Telephone Encounter (Signed)
There is no interaction between the 2 drugs. Eliquis should improve the risk of clotting associated with premarin.

## 2014-11-15 ENCOUNTER — Ambulatory Visit (INDEPENDENT_AMBULATORY_CARE_PROVIDER_SITE_OTHER): Payer: Medicare HMO | Admitting: Podiatry

## 2014-11-15 ENCOUNTER — Encounter: Payer: Self-pay | Admitting: Podiatry

## 2014-11-15 DIAGNOSIS — L84 Corns and callosities: Secondary | ICD-10-CM

## 2014-11-16 NOTE — Progress Notes (Signed)
Subjective:     Patient ID: Teresa Harris, female   DOB: 14-Mar-1941, 73 y.o.   MRN: [id number]  HPI patient presents I have these lesions on the plantar aspect of my right foot which continue to hurt   Review of Systems     Objective:   Physical Exam Neurovascular status intact with keratotic lesion sub-right foot that are painful when pressed    Assessment:     Lesion secondary to structural abnormalities    Plan:     Debridement lesion with no iatrogenic bleeding noted

## 2014-11-18 LAB — LIPID PANEL
Cholesterol: 201 mg/dL — AB (ref 0–200)
HDL: 55 mg/dL (ref 35–70)
LDL Cholesterol: 98 mg/dL
Triglycerides: 240 mg/dL — AB (ref 40–160)

## 2014-11-21 ENCOUNTER — Ambulatory Visit (INDEPENDENT_AMBULATORY_CARE_PROVIDER_SITE_OTHER): Payer: Medicare HMO | Admitting: General Surgery

## 2014-11-21 ENCOUNTER — Encounter: Payer: Self-pay | Admitting: General Surgery

## 2014-11-21 VITALS — BP 130/74 | HR 76 | Resp 12 | Ht 68.0 in | Wt 193.0 lb

## 2014-11-21 DIAGNOSIS — K648 Other hemorrhoids: Secondary | ICD-10-CM

## 2014-11-21 DIAGNOSIS — K625 Hemorrhage of anus and rectum: Secondary | ICD-10-CM

## 2014-11-21 LAB — POC HEMOCCULT BLD/STL (OFFICE/1-CARD/DIAGNOSTIC): Fecal Occult Blood, POC: POSITIVE

## 2014-11-21 NOTE — Patient Instructions (Signed)

## 2014-11-21 NOTE — Progress Notes (Addendum)
Patient ID: Teresa Harris, female   DOB: [birth year], 73 y.o.   MRN: [id number]  Chief Complaint  Patient presents with  . Other    hemorrhoids    HPI Teresa Harris is a 73 y.o. female  Her for evaluation of hemorrhoids. She has had them since the 1970s. She will get some bleeding with bowel movements that is mild. She reports this is not related to stool consistency. She was recently placed on a blood thinner for atrial fibrillation and this has made the bleeding episodes slightly worse. She reports her last colonoscopy about 4 years and was reported as normal aside from her hemorrhoids. She does not report pain with these.  HPI  Past Medical History  Diagnosis Date  . Arthritis   . Depression   . Hemorrhoids   . History of migraine headaches   . Glaucoma     left eye  . Hypercholesteremia   . Hiatal hernia   . Kidney calculi   . Osteopenia   . Atrial fibrillation 10/01/14    Past Surgical History  Procedure Laterality Date  . Replacement total knee      bilateral  . Shoulder surgery      right shoulder  . Abdominal hysterectomy    . Foot surgery      bilateral   . Nasal septum surgery    . Colonoscopy  12/29/09    Dr Dionne Milo  . Eye surgery Left     shunt placed    History reviewed. No pertinent family history.  Social History History  Substance Use Topics  . Smoking status: Former Smoker -- 1.00 packs/day for 30 years    Types: Cigarettes  . Smokeless tobacco: Not on file  . Alcohol Use: No    Allergies  Allergen Reactions  . [name] [Moxifloxacin Hcl In Nacl]   . Bimatoprost     Other reaction(s): Other (See Comments) Made my eyes red, and hurt  . Brimonidine     Other reaction(s): Other (See Comments) Made my eyes red, and hurt  . Keflex [Cephalexin]     headache  . Mobic [Meloxicam] Other (See Comments)    headache  . Propofol     Other reaction(s): Other (See Comments) Had difficulty waking up  . Travoprost   . Vicodin  [Hydrocodone-Acetaminophen] Nausea And Vomiting    Sick on stomach Sick on stomach    Current Outpatient Prescriptions  Medication Sig Dispense Refill  . apixaban (ELIQUIS) 5 MG TABS tablet Take 1 tablet (5 mg total) by mouth 2 (two) times daily. 60 tablet 3  . calcium carbonate (OS-CAL) 600 MG TABS Take 600 mg by mouth daily with breakfast.     . celecoxib (CELEBREX) 200 MG capsule Take 200 mg by mouth daily.     . cycloSPORINE (RESTASIS) 0.05 % ophthalmic emulsion Place 1 drop into both eyes 2 (two) times daily.     . digoxin (LANOXIN) 0.125 MG tablet Take 1 tablet (0.125 mg total) by mouth daily. 30 tablet 6  . dorzolamide-timolol (COSOPT) 22.3-6.8 MG/ML ophthalmic solution Place 1 drop into the right eye 2 (two) times daily.     Marland Kitchen estrogens, conjugated, (PREMARIN) 0.625 MG tablet Take 0.625 mg by mouth daily. Take daily for 21 days then do not take for 7 days.    . Magnesium 400 MG CAPS Take 400 mg by mouth 2 (two) times daily.    . metoprolol succinate (TOPROL-XL) 25 MG 24 hr tablet Take 25 mg by  mouth daily.    . Multiple Vitamin (MULTIVITAMIN) tablet Take 1 tablet by mouth daily.    . Omega-3 Fatty Acids (FISH OIL) 1200 MG CAPS Take 1,200 mg by mouth 2 (two) times daily.    Marland Kitchen omeprazole (PRILOSEC) 40 MG capsule Take 40 mg by mouth daily.     No current facility-administered medications for this visit.    Review of Systems Review of Systems  Constitutional: Negative.   Respiratory: Negative.   Cardiovascular: Negative.   Gastrointestinal: Negative.     Blood pressure 130/74, pulse 76, resp. rate 12, height 5\' 8"  (1.727 m), weight 193 lb (87.544 kg).  Physical Exam Physical Exam  Constitutional: She is oriented to person, place, and time. She appears well-developed and well-nourished.  Eyes: Conjunctivae are normal. No scleral icterus.  Neck: Neck supple.  Cardiovascular: An irregularly irregular rhythm present.  Atrial fibrulation   Pulmonary/Chest: Effort normal and  breath sounds normal.  Genitourinary: Rectal exam shows internal hemorrhoid (Fullness on the right anterior rectal wall.). Rectal exam shows no external hemorrhoid. Guaiac positive stool.  Anoscopy shows a multilobulated lesion less than a centimeter in diameter associated with an internal hemorrhoid. This accounts for the palpable finding.  Lymphadenopathy:    She has no cervical adenopathy.  Neurological: She is alert and oriented to person, place, and time.    Assessment    Internal hemorrhoid with bleeding.    Plan    The patient had a colonoscopy completed on 12/29/2009. This was a screening exam. Internal hemorrhoids and diverticulosis were noted. 10 year follow-up recommended. Completed by Dr. Dionne Milo.  Considering the multilobulated nature of the area, while likely all chronic inflammation, I recommended excision and then rubber band application for control bleeding.  The patient is scheduled to meet with her cardiologist on December 30. We'll ask if the patient can come off her Eliquis for 48 hours for the procedure to be completed.    PCP: Dr Margarita Rana Ref:  Dr. Kathlyn [place]  Robert Bellow 11/23/2014, 8:48 AM

## 2014-11-23 DIAGNOSIS — K625 Hemorrhage of anus and rectum: Secondary | ICD-10-CM | POA: Insufficient documentation

## 2014-11-23 DIAGNOSIS — K648 Other hemorrhoids: Secondary | ICD-10-CM | POA: Insufficient documentation

## 2014-12-04 ENCOUNTER — Ambulatory Visit (INDEPENDENT_AMBULATORY_CARE_PROVIDER_SITE_OTHER): Payer: Medicare HMO | Admitting: Cardiovascular Disease

## 2014-12-04 ENCOUNTER — Encounter: Payer: Self-pay | Admitting: Cardiovascular Disease

## 2014-12-04 VITALS — BP 110/78 | HR 88 | Ht 68.0 in | Wt 194.8 lb

## 2014-12-04 DIAGNOSIS — K648 Other hemorrhoids: Secondary | ICD-10-CM

## 2014-12-04 DIAGNOSIS — I4891 Unspecified atrial fibrillation: Secondary | ICD-10-CM

## 2014-12-04 NOTE — Assessment & Plan Note (Signed)
She is doing reasonably well with rate control. However, she continues to complain of fatigue. This improved slightly after decreasing the dose of Toprol. Continue current medications for now as well as anticoagulation with Eliquis. This can be held 2 days before the surgery. I will consider cardioversion after the surgery.

## 2014-12-04 NOTE — Assessment & Plan Note (Signed)
She is scheduled for surgery next month. She is overall at low risk for cardiac complications.

## 2014-12-04 NOTE — Progress Notes (Signed)
Primary care provider: Maurine Minister, FNP  HPI  This is a pleasant 73 year old Caucasian female who is here today for a follow-up regarding  atrial fibrillation.  She has borderline diabetes and hyperlipidemia not requiring medications. She smoked in the past more than 30 years ago. There is no family history of coronary artery disease or arrhythmia.  She was seen recently for palpitations and chest pain. She was found to be in atrial fibrillation. Heart rate was not well controlled on Toprol with borderline low blood pressure. Thus, I added digoxin.She underwent a nuclear stress test which showed no evidence of ischemia with normal ejection fraction. Echocardiogram showed normal EF with mild to moderate mitral regurgitation and mildly dilated left atrium. She was seen by Dr. Charissa Bash for evaluations hemorrhoids. She is scheduled for surgery next month. She reports worsening bleeding since she was started on Eliquis. She continues to feel tired and fatigued especially after she takes Toprol. The dose was decreased by half.    Allergies  Allergen Reactions  . [name] [Moxifloxacin Hcl In Nacl]   . Bimatoprost     Other reaction(s): Other (See Comments) Made my eyes red, and hurt  . Brimonidine     Other reaction(s): Other (See Comments) Made my eyes red, and hurt  . Keflex [Cephalexin]     headache  . Mobic [Meloxicam] Other (See Comments)    headache  . Propofol     Other reaction(s): Other (See Comments) Had difficulty waking up  . Travoprost   . Vicodin [Hydrocodone-Acetaminophen] Nausea And Vomiting    Sick on stomach Sick on stomach     Current Outpatient Prescriptions on File Prior to Visit  Medication Sig Dispense Refill  . apixaban (ELIQUIS) 5 MG TABS tablet Take 1 tablet (5 mg total) by mouth 2 (two) times daily. 60 tablet 3  . calcium carbonate (OS-CAL) 600 MG TABS Take 600 mg by mouth daily with breakfast.     . celecoxib (CELEBREX) 200 MG capsule Take 200 mg by mouth  daily.     . cycloSPORINE (RESTASIS) 0.05 % ophthalmic emulsion Place 1 drop into both eyes 2 (two) times daily.     . digoxin (LANOXIN) 0.125 MG tablet Take 1 tablet (0.125 mg total) by mouth daily. 30 tablet 6  . dorzolamide-timolol (COSOPT) 22.3-6.8 MG/ML ophthalmic solution Place 1 drop into the right eye 2 (two) times daily.     Marland Kitchen estrogens, conjugated, (PREMARIN) 0.625 MG tablet Take 0.625 mg by mouth daily.     . Magnesium 400 MG CAPS Take 400 mg by mouth 2 (two) times daily.    . metoprolol succinate (TOPROL-XL) 25 MG 24 hr tablet Take 12.5 mg by mouth daily.     . Multiple Vitamin (MULTIVITAMIN) tablet Take 1 tablet by mouth daily.    . Omega-3 Fatty Acids (FISH OIL) 1200 MG CAPS Take 1,200 mg by mouth 2 (two) times daily.    Marland Kitchen omeprazole (PRILOSEC) 40 MG capsule Take 40 mg by mouth daily.     No current facility-administered medications on file prior to visit.     Past Medical History  Diagnosis Date  . Arthritis   . Depression   . Hemorrhoids   . History of migraine headaches   . Glaucoma     left eye  . Hypercholesteremia   . Hiatal hernia   . Kidney calculi   . Osteopenia   . Atrial fibrillation 10/01/14     Past Surgical History  Procedure Laterality Date  .  Replacement total knee      bilateral  . Shoulder surgery      right shoulder  . Abdominal hysterectomy    . Foot surgery      bilateral   . Nasal septum surgery    . Colonoscopy  12/29/09    Dr Dionne Milo  . Eye surgery Left     shunt placed     History reviewed. No pertinent family history.   History   Social History  . Marital Status: Divorced    Spouse Name: N/A    Number of Children: N/A  . Years of Education: N/A   Occupational History  . Not on file.   Social History Main Topics  . Smoking status: Former Smoker -- 1.00 packs/day for 30 years    Types: Cigarettes  . Smokeless tobacco: Not on file  . Alcohol Use: No  . Drug Use: No  . Sexual Activity: Not on file   Other Topics  Concern  . Not on file   Social History Narrative     ROS A 10 point review of system was performed. It is negative other than that mentioned in the history of present illness.     PHYSICAL EXAM   BP 110/78 mmHg  Pulse 88  Ht 5\' 8"  (1.727 m)  Wt 194 lb 12 oz (88.338 kg)  BMI 29.62 kg/m2 Constitutional: She is oriented to person, place, and time. She appears well-developed and well-nourished. No distress.  HENT: No nasal discharge.  Head: Normocephalic and atraumatic.  Eyes: Pupils are equal and round. Right eye exhibits no discharge. Left eye exhibits no discharge.  Neck: Normal range of motion. Neck supple. No JVD present. No thyromegaly present.  Cardiovascular: Tachycardic, irregular rhythm, normal heart sounds. Exam reveals no gallop and no friction rub. No murmur heard.  Pulmonary/Chest: Effort normal and breath sounds normal. No stridor. No respiratory distress. She has no wheezes. She has no rales. She exhibits no tenderness.  Abdominal: Soft. Bowel sounds are normal. She exhibits no distension. There is no tenderness. There is no rebound and no guarding.  Musculoskeletal: Normal range of motion. She exhibits no edema and no tenderness.  Neurological: She is alert and oriented to person, place, and time. Coordination normal.  Skin: Skin is warm and dry. No rash noted. She is not diaphoretic. No erythema. No pallor.  Psychiatric: She has a normal mood and affect. Her behavior is normal. Judgment and thought content normal.     EKG: Atrial fibrillation  -Nonspecific ST depression   +   Diffuse nonspecific T-abnormality  -Nondiagnostic.   Low voltage -possible pulmonary disease.   ABNORMAL     ASSESSMENT AND PLAN

## 2014-12-04 NOTE — Patient Instructions (Signed)
Continue same medications.   Hold Eliquis 2 days before the surgery.   Follow up in 2 months.

## 2015-01-02 ENCOUNTER — Encounter: Payer: Self-pay | Admitting: General Surgery

## 2015-01-02 ENCOUNTER — Ambulatory Visit (INDEPENDENT_AMBULATORY_CARE_PROVIDER_SITE_OTHER): Payer: BLUE CROSS/BLUE SHIELD | Admitting: General Surgery

## 2015-01-02 VITALS — BP 124/68 | HR 76 | Resp 14 | Ht 68.0 in | Wt 191.0 lb

## 2015-01-02 DIAGNOSIS — K648 Other hemorrhoids: Secondary | ICD-10-CM

## 2015-01-02 NOTE — Progress Notes (Signed)
Patient ID: Teresa Harris, female   DOB: 1941-04-10, 74 y.o.   MRN: [id number]  Chief Complaint  Patient presents with  . Procedure    excision of hemorrhoid    HPI Teresa Harris is a 74 y.o. female who presents for an excision of her bleeding internal hemorrhoid. She denies any new issues at this time, but does report ongoing bleeding with defecation. Permission to hold her Eliquis  for 48 hours was received from her cardiologist.    HPI  Past Medical History  Diagnosis Date  . Arthritis   . Depression   . Hemorrhoids   . History of migraine headaches   . Glaucoma     left eye  . Hypercholesteremia   . Hiatal hernia   . Kidney calculi   . Osteopenia   . Atrial fibrillation 10/01/14    Past Surgical History  Procedure Laterality Date  . Replacement total knee      bilateral  . Shoulder surgery      right shoulder  . Abdominal hysterectomy    . Foot surgery      bilateral   . Nasal septum surgery    . Colonoscopy  12/29/09    Dr Dionne Milo  . Eye surgery Left     shunt placed    No family history on file.  Social History History  Substance Use Topics  . Smoking status: Former Smoker -- 1.00 packs/day for 30 years    Types: Cigarettes  . Smokeless tobacco: Not on file  . Alcohol Use: No    Allergies  Allergen Reactions  . [name] [Moxifloxacin Hcl In Nacl]   . Bimatoprost     Other reaction(s): Other (See Comments) Made my eyes red, and hurt  . Brimonidine     Other reaction(s): Other (See Comments) Made my eyes red, and hurt  . Keflex [Cephalexin]     headache  . Mobic [Meloxicam] Other (See Comments)    headache  . Propofol     Other reaction(s): Other (See Comments) Had difficulty waking up  . Travoprost   . Vicodin [Hydrocodone-Acetaminophen] Nausea And Vomiting    Sick on stomach Sick on stomach    Current Outpatient Prescriptions  Medication Sig Dispense Refill  . apixaban (ELIQUIS) 5 MG TABS tablet Take 1 tablet (5 mg total) by  mouth 2 (two) times daily. 60 tablet 3  . calcium carbonate (OS-CAL) 600 MG TABS Take 600 mg by mouth daily with breakfast.     . celecoxib (CELEBREX) 200 MG capsule Take 200 mg by mouth daily.     . cycloSPORINE (RESTASIS) 0.05 % ophthalmic emulsion Place 1 drop into both eyes 2 (two) times daily.     . digoxin (LANOXIN) 0.125 MG tablet Take 1 tablet (0.125 mg total) by mouth daily. 30 tablet 6  . dorzolamide-timolol (COSOPT) 22.3-6.8 MG/ML ophthalmic solution Place 1 drop into the right eye 2 (two) times daily.     Marland Kitchen estrogens, conjugated, (PREMARIN) 0.625 MG tablet Take 0.625 mg by mouth daily.     . Magnesium 400 MG CAPS Take 400 mg by mouth 2 (two) times daily.    . metoprolol succinate (TOPROL-XL) 25 MG 24 hr tablet Take 12.5 mg by mouth daily.     . Multiple Vitamin (MULTIVITAMIN) tablet Take 1 tablet by mouth daily.    . Omega-3 Fatty Acids (FISH OIL) 1200 MG CAPS Take 1,200 mg by mouth 2 (two) times daily.    Marland Kitchen omeprazole (PRILOSEC) 40 MG  capsule Take 40 mg by mouth daily.     No current facility-administered medications for this visit.    Review of Systems Review of Systems  Constitutional: Negative.   Respiratory: Negative.   Cardiovascular: Negative.     Blood pressure 124/68, pulse 76, resp. rate 14, height 5\' 8"  (1.727 m), weight 191 lb (86.637 kg).  Physical Exam Physical Exam External anal exam was unremarkable. Anoscopy was completed on the right anterior wall the prominent internal hemorrhoid with a 1 cm lobulated mass was again identified. As she maintained Valsalva during extraction of the scope the mass was exposed. This was grasped with forceps and found to be asensate.     Assessment    Internal hemorrhoid with focal inflammation secondary to recurrent prolapse.    Plan    A double rubber band ligation was applied to the base of the internal hemorrhoid with transient discomfort during application with rapid resolution. The inflammatory component as well as  some of the hemorrhoid was sharply excised area and no bleeding was noted. The excised portion was sent for histology. The patient was observed for 10 minutes and experienced no pain or bleeding.  She was advised that she may well see a drop of blood over the next several days. She will resume her Eliquis tomorrow. She was encouraged to report any unusual bleeding promptly.  We will plan for follow-up exam in 3 weeks to reassess the area. She'll be contacted when pathology is available.    PCP: No PCP per patient    Robert Bellow 01/02/2015, 11:17 AM

## 2015-01-02 NOTE — Patient Instructions (Addendum)
Patient to return in 3 weeks for follow up. The patient is aware to call back for any questions or concerns.

## 2015-01-22 ENCOUNTER — Encounter: Payer: Self-pay | Admitting: General Surgery

## 2015-01-22 ENCOUNTER — Ambulatory Visit (INDEPENDENT_AMBULATORY_CARE_PROVIDER_SITE_OTHER): Payer: BLUE CROSS/BLUE SHIELD | Admitting: General Surgery

## 2015-01-22 VITALS — BP 130/72 | HR 74 | Resp 12 | Ht 68.0 in | Wt 192.0 lb

## 2015-01-22 DIAGNOSIS — K648 Other hemorrhoids: Secondary | ICD-10-CM

## 2015-01-22 NOTE — Progress Notes (Signed)
Patient ID: Teresa Harris, female   DOB: 1941/04/03, 74 y.o.   MRN: [id number]  Chief Complaint  Patient presents with  . Follow-up    3 week follow up hemorrhoids    HPI Teresa Harris is a 74 y.o. female who presents for a 3 week follow up of hemorrhoids. Patient states she is doing well. She states she is not having any more bleeding.   HPI  Past Medical History  Diagnosis Date  . Arthritis   . Depression   . Hemorrhoids   . History of migraine headaches   . Glaucoma     left eye  . Hypercholesteremia   . Hiatal hernia   . Kidney calculi   . Osteopenia   . Atrial fibrillation 10/01/14    Past Surgical History  Procedure Laterality Date  . Replacement total knee      bilateral  . Shoulder surgery      right shoulder  . Abdominal hysterectomy    . Foot surgery      bilateral   . Nasal septum surgery    . Colonoscopy  12/29/09    Dr Dionne Milo  . Eye surgery Left     shunt placed    History reviewed. No pertinent family history.  Social History History  Substance Use Topics  . Smoking status: Former Smoker -- 1.00 packs/day for 30 years    Types: Cigarettes  . Smokeless tobacco: Not on file  . Alcohol Use: No    Allergies  Allergen Reactions  . [name] [Moxifloxacin Hcl In Nacl]   . Bimatoprost     Other reaction(s): Other (See Comments) Made my eyes red, and hurt  . Brimonidine     Other reaction(s): Other (See Comments) Made my eyes red, and hurt  . Keflex [Cephalexin]     headache  . Mobic [Meloxicam] Other (See Comments)    headache  . Propofol     Other reaction(s): Other (See Comments) Had difficulty waking up  . Travoprost   . Vicodin [Hydrocodone-Acetaminophen] Nausea And Vomiting    Sick on stomach Sick on stomach    Current Outpatient Prescriptions  Medication Sig Dispense Refill  . apixaban (ELIQUIS) 5 MG TABS tablet Take 1 tablet (5 mg total) by mouth 2 (two) times daily. 60 tablet 3  . calcium carbonate (OS-CAL) 600 MG  TABS Take 600 mg by mouth daily with breakfast.     . celecoxib (CELEBREX) 200 MG capsule Take 200 mg by mouth daily.     . cycloSPORINE (RESTASIS) 0.05 % ophthalmic emulsion Place 1 drop into both eyes 2 (two) times daily.     . digoxin (LANOXIN) 0.125 MG tablet Take 1 tablet (0.125 mg total) by mouth daily. 30 tablet 6  . dorzolamide-timolol (COSOPT) 22.3-6.8 MG/ML ophthalmic solution Place 1 drop into the right eye 2 (two) times daily.     Marland Kitchen estrogens, conjugated, (PREMARIN) 0.625 MG tablet Take 0.625 mg by mouth daily.     . Magnesium 400 MG CAPS Take 400 mg by mouth 2 (two) times daily.    . metoprolol succinate (TOPROL-XL) 25 MG 24 hr tablet Take 12.5 mg by mouth daily.     . Multiple Vitamin (MULTIVITAMIN) tablet Take 1 tablet by mouth daily.    . Omega-3 Fatty Acids (FISH OIL) 1200 MG CAPS Take 1,200 mg by mouth 2 (two) times daily.    Marland Kitchen omeprazole (PRILOSEC) 40 MG capsule Take 40 mg by mouth daily.     No  current facility-administered medications for this visit.    Review of Systems Review of Systems  Constitutional: Negative.   Respiratory: Negative.   Cardiovascular: Negative.   Gastrointestinal: Negative.     Blood pressure 130/72, pulse 74, resp. rate 12, height 5\' 8"  (1.727 m), weight 192 lb (87.091 kg).  Physical Exam Physical Exam External anal exam shows no prolapsing tissue. No bleeding.  Data Reviewed Pathology showed evidence of an internal hemorrhoid. No malignancy.  Assessment    Resolution of rectal bleeding with removal of internal hemorrhoid.    Plan    The patient reports that she had been told by Dolores Frame, NP that she needed to consider having a bladder repair. She denies urinary incontinence.  I've recommended that she have a GYN exam (previously seen by Dr. Verlene Mayer in the distant past) sees for formal assessment. I spoke with Dr. Laurey Morale, and unless the patient is symptomatic or has other issues, surgical intervention would be unlikely.  He was willing to see the patient and offer a formal opinion.  I offered to make her an appointment, but she reported that she's been deferred at this time, just need to know which direction go when she is ready for formal evaluation.    PCP:  No Pcp    Robert Bellow 01/22/2015, 9:38 PM

## 2015-01-22 NOTE — Patient Instructions (Signed)
Patient to return as needed. 

## 2015-02-04 ENCOUNTER — Encounter: Payer: Self-pay | Admitting: Cardiovascular Disease

## 2015-02-04 ENCOUNTER — Ambulatory Visit (INDEPENDENT_AMBULATORY_CARE_PROVIDER_SITE_OTHER): Payer: BLUE CROSS/BLUE SHIELD | Admitting: Cardiovascular Disease

## 2015-02-04 VITALS — BP 114/98 | HR 78 | Ht 68.0 in | Wt 189.5 lb

## 2015-02-04 DIAGNOSIS — I4891 Unspecified atrial fibrillation: Secondary | ICD-10-CM

## 2015-02-04 NOTE — Patient Instructions (Addendum)
Continue same medications.   Your physician wants you to follow-up in: 6 months.  You will receive a reminder letter in the mail two months in advance. If you don't receive a letter, please call our office to schedule the follow-up appointment.  

## 2015-02-04 NOTE — Progress Notes (Signed)
Primary care provider: Maurine Minister, FNP  HPI  This is a pleasant 74 year old Caucasian female who is here today for a follow-up regarding  atrial fibrillation.  She has borderline diabetes and hyperlipidemia not requiring medications. She smoked in the past more than 30 years ago. There is no family history of coronary artery disease or arrhythmia.  She was last year for palpitations and chest pain. She was found to be in atrial fibrillation. Heart rate was not well controlled on Toprol with borderline low blood pressure. Thus, I added digoxin.She underwent a nuclear stress test which showed no evidence of ischemia with normal ejection fraction. Echocardiogram showed normal EF with mild to moderate mitral regurgitation and mildly dilated left atrium. She underwent hemorrhoid surgery by Dr. Charissa Bash without complications. She denies any bleeding issues with anticoagulation.  Allergies  Allergen Reactions  . [name] [Moxifloxacin Hcl In Nacl]   . Bimatoprost     Other reaction(s): Other (See Comments) Made my eyes red, and hurt  . Brimonidine     Other reaction(s): Other (See Comments) Made my eyes red, and hurt  . Keflex [Cephalexin]     headache  . Mobic [Meloxicam] Other (See Comments)    headache  . Propofol     Other reaction(s): Other (See Comments) Had difficulty waking up  . Travoprost   . Vicodin [Hydrocodone-Acetaminophen] Nausea And Vomiting    Sick on stomach Sick on stomach     Current Outpatient Prescriptions on File Prior to Visit  Medication Sig Dispense Refill  . apixaban (ELIQUIS) 5 MG TABS tablet Take 1 tablet (5 mg total) by mouth 2 (two) times daily. 60 tablet 3  . calcium carbonate (OS-CAL) 600 MG TABS Take 600 mg by mouth daily with breakfast.     . celecoxib (CELEBREX) 200 MG capsule Take 200 mg by mouth daily.     . cycloSPORINE (RESTASIS) 0.05 % ophthalmic emulsion Place 1 drop into both eyes 2 (two) times daily.     . digoxin (LANOXIN) 0.125 MG tablet  Take 1 tablet (0.125 mg total) by mouth daily. (Patient taking differently: Take 0.0625 mg by mouth daily. ) 30 tablet 6  . dorzolamide-timolol (COSOPT) 22.3-6.8 MG/ML ophthalmic solution Place 1 drop into the right eye 2 (two) times daily.     Marland Kitchen estrogens, conjugated, (PREMARIN) 0.625 MG tablet Take 0.625 mg by mouth daily.     . Magnesium 400 MG CAPS Take 400 mg by mouth 2 (two) times daily.    . metoprolol succinate (TOPROL-XL) 25 MG 24 hr tablet Take 12.5 mg by mouth daily.     . Multiple Vitamin (MULTIVITAMIN) tablet Take 1 tablet by mouth daily.    . Omega-3 Fatty Acids (FISH OIL) 1200 MG CAPS Take 1,200 mg by mouth 2 (two) times daily.    Marland Kitchen omeprazole (PRILOSEC) 40 MG capsule Take 40 mg by mouth daily.     No current facility-administered medications on file prior to visit.     Past Medical History  Diagnosis Date  . Arthritis   . Depression   . Hemorrhoids   . History of migraine headaches   . Glaucoma     left eye  . Hypercholesteremia   . Hiatal hernia   . Kidney calculi   . Osteopenia   . Atrial fibrillation 10/01/14     Past Surgical History  Procedure Laterality Date  . Replacement total knee      bilateral  . Shoulder surgery      right shoulder  .  Abdominal hysterectomy    . Foot surgery      bilateral   . Nasal septum surgery    . Colonoscopy  12/29/09    Dr Dionne Milo  . Eye surgery Left     shunt placed  . Hemorrhoid surgery      Removed Growth     History reviewed. No pertinent family history.   History   Social History  . Marital Status: Divorced    Spouse Name: N/A  . Number of Children: N/A  . Years of Education: N/A   Occupational History  . Not on file.   Social History Main Topics  . Smoking status: Former Smoker -- 1.00 packs/day for 30 years    Types: Cigarettes  . Smokeless tobacco: Not on file  . Alcohol Use: No  . Drug Use: No  . Sexual Activity: Not on file   Other Topics Concern  . Not on file   Social History  Narrative     ROS A 10 point review of system was performed. It is negative other than that mentioned in the history of present illness.     PHYSICAL EXAM   BP 114/98 mmHg  Pulse 78  Ht 5\' 8"  (1.727 m)  Wt 189 lb 8 oz (85.957 kg)  BMI 28.82 kg/m2 Constitutional: She is oriented to person, place, and time. She appears well-developed and well-nourished. No distress.  HENT: No nasal discharge.  Head: Normocephalic and atraumatic.  Eyes: Pupils are equal and round. Right eye exhibits no discharge. Left eye exhibits no discharge.  Neck: Normal range of motion. Neck supple. No JVD present. No thyromegaly present.  Cardiovascular: Tachycardic, irregular rhythm, normal heart sounds. Exam reveals no gallop and no friction rub. No murmur heard.  Pulmonary/Chest: Effort normal and breath sounds normal. No stridor. No respiratory distress. She has no wheezes. She has no rales. She exhibits no tenderness.  Abdominal: Soft. Bowel sounds are normal. She exhibits no distension. There is no tenderness. There is no rebound and no guarding.  Musculoskeletal: Normal range of motion. She exhibits no edema and no tenderness.  Neurological: She is alert and oriented to person, place, and time. Coordination normal.  Skin: Skin is warm and dry. No rash noted. She is not diaphoretic. No erythema. No pallor.  Psychiatric: She has a normal mood and affect. Her behavior is normal. Judgment and thought content normal.     EKG: Atrial fibrillation with controlled ventricular rate. Nonspecific T wave changes.    ASSESSMENT AND PLAN

## 2015-02-09 NOTE — Assessment & Plan Note (Signed)
She is doing reasonably well with no significant palpitations. She is tolerating anticoagulation with Eliquis. Ventricular rate is well controlled. I discussed the option of proceeding with cardioversion. However, given that she is not having significant symptoms, it might be reasonable to continue with rate control and anticoagulation. She prefers this route.

## 2015-02-17 ENCOUNTER — Other Ambulatory Visit: Payer: Self-pay | Admitting: *Deleted

## 2015-02-17 MED ORDER — APIXABAN 5 MG PO TABS: 5.0000 mg | ORAL_TABLET | Freq: Two times a day (BID) | ORAL | Status: DC

## 2015-03-30 NOTE — Discharge Summary (Signed)
PATIENT NAME:  Teresa Harris, Teresa Harris MR#:  [id number] DATE OF BIRTH:  1941-11-18  DATE OF ADMISSION:  12/27/2011 DATE OF DISCHARGE:  12/30/2011  ADMITTING DIAGNOSIS: Degenerative arthrosis of the right knee.   DISCHARGE DIAGNOSIS: Degenerative arthrosis of the right knee.   HISTORY: The patient is a pleasant 73 year old who has been followed at [facility] for progression of right knee pain. She reported a long history of right knee pain which increased significantly over the past 5 to 6 months. She localized most of the pain along the medial aspect of the knee. Her pain was noted to be aggravated with weight-bearing activities. The patient had not seen any significant improvement in her symptoms despite anti-inflammatory medications, cortisone injections, as well as viscous supplementation and physical therapy. The patient had previously undergone an arthroscopic surgery with no improvement as well. Prior to surgery, she was not using any type of ambulatory aid. The patient had also reported some near giving way as well as swelling of the knee but denied any gross locking. She states that the pain had progressed to the point that it was significantly interfering with her activities of daily living. X-rays taken in the clinic showed narrowing of the medial cartilage space with associated varus alignment. She was noted to have subchondral sclerosis as well as osteophyte formation. After discussion of the risks and benefits of surgical intervention, the patient expressed her understanding of the risks and benefits and agreed with plans for surgical intervention.   PROCEDURE: Right total knee arthroplasty using computer-assisted navigation. Anesthesia: Femoral nerve block with spinal. Soft tissue release: Anterior cruciate ligament, posterior cruciate ligament, deep medial collateral ligaments as well as patellofemoral ligament. Implants utilized: DePuy PFC Sigma size 4 posterior stabilized femoral component  (cemented), size 3 MBT tibial component (cemented), 38 mm three-pegged oval dome patella (cemented), and a 10 mm stabilized rotating platform polyethylene insert.   HOSPITAL COURSE: The patient tolerated the procedure very well. She had no complications. She was then taken to the PAC-U where she was stabilized and then transferred to the Orthopedic floor. She began receiving anticoagulation therapy of Lovenox 30 mg subcutaneous every 12 hours per Anesthesia protocol. She was also fitted with the AVI compression foot pumps bilaterally set at 130 mmHg. The patient's heels have been elevated off the bed using rolled towels. The heels have been nontender. There has been no evidence of any deep venous thrombosis. Negative Homans sign.   The patient has denied any chest pain or shortness of breath. Vital signs have been stable. She has been afebrile. Hemodynamically she was stable. No transfusions were given other than the Autovac transfusion given the first 6 hours postoperatively.   Physical therapy was initiated on day one for gait training and transfers. Upon being discharged, she was ambulating greater than 200 feet. She was independent with bed-to-chair  transfers. She was able to go up and down four sets of steps. Occupational therapy was also initiated on day one for activities of daily living and assistive devices.   The patient's IV, Foley and Hemovac were all discontinued on day two along with the dressing change. The Polar Care was reapplied to the surgical leg to maintain a temperature of 40 to 50 degrees Fahrenheit. There have been no complications throughout the hospital course.   DISPOSITION: The patient is being discharged to home in improved stable condition.   DISCHARGE INSTRUCTIONS:  1. She may weight bear as tolerated.  2. She is to continue using a walker until  cleared by Physical Therapy to go to a quad cane.  3. She is to continue with TED stockings. These are to be worn during the  day but may be removed at night.  4. She is to wear the Polar Care as much as she can around-the-clock.  5. She has a follow-up appointment in the clinic in two weeks, sooner if any temperatures of 101.5 or greater or excessive bleeding.  6. She is placed on a regular diet.  7. She will receive Home Health physical therapy.  8. She is to resume her regular medication that she was on prior to admission. She was given a prescription for Lovenox 40 mg subcutaneously daily for 14 days, then discontinue and begin taking one 81 mg enteric-coated aspirin per day. A prescription for oxycodone 5 to 10 mg every 4 to 6 hours was given, as well as Ultram 50 mg 1 to 2 tablets every 4 to 6 hours p.r.n. for pain.   PAST MEDICAL HISTORY:  1. Arthritis.  2. Depression.    3. Hemorrhoids.  4. Migraine headaches.    ____________________________ Vance Peper, PA [id number] D: 12/30/2011 08:01:00 ET T: 12/30/2011 13:02:05 ET JOB#: [id number]  cc: Vance Peper, PA, <Dictator> JON WOLFE PA ELECTRONICALLY SIGNED 12/30/2011 19:46

## 2015-03-30 NOTE — Op Note (Signed)
PATIENT NAME:  Teresa Harris, Teresa Harris MACHEL VIOLANTE MR#:  [id number] DATE OF BIRTH:  September 03, 1941  DATE OF PROCEDURE:  12/27/2011  PREOPERATIVE DIAGNOSIS: Degenerative arthrosis of the right knee.   POSTOPERATIVE DIAGNOSIS: Degenerative arthrosis of the right knee.   PROCEDURE PERFORMED: Right total knee arthroplasty using computer-assisted navigation.   SURGEON: Skip Estimable, M.D.   ASSISTANT: Vance Peper, PA-C (required to maintain retraction throughout the procedure)   ANESTHESIA: Femoral nerve block and spinal.   ESTIMATED BLOOD LOSS: 200 mL.   FLUIDS REPLACED: 1400 mL of crystalloid.   TOURNIQUET TIME: 83 minutes.   DRAINS: Two medium drains to reinfusion system.   SOFT TISSUE RELEASES: Anterior cruciate ligament, posterior cruciate ligament, deep medial collateral ligament, and patellofemoral ligament.   IMPLANTS UTILIZED: DePuy PFC Sigma size four posterior stabilized femoral component (cemented), size three MBT tibial component (cemented), 38-mm three peg oval dome patella (cemented), and a 10-mm stabilized rotating platform polyethylene insert.   INDICATIONS FOR SURGERY: The patient is a 74 year old female who has been seen for complaints of progressive right knee pain. X-rays demonstrated severe degenerative changes in tricompartmental fashion with relative varus deformity. She had not seen any significant improvement despite conservative nonsurgical intervention. After a discussion of the risks and benefits of surgical intervention, the patient expressed her understanding of the risks and benefits and agreed with plans for surgical intervention.   PROCEDURE IN DETAIL: The patient was brought into the Operating Room and, after adequate femoral nerve block and spinal anesthesia was achieved, a tourniquet was placed on the patient's upper right thigh. The patient's right knee and leg were cleaned and prepped with alcohol and DuraPrep and draped in the usual sterile fashion. A "time-out" was performed  as per usual protocol. The right lower extremity was exsanguinated using an Esmarch, and the tourniquet was inflated to 300 mmHg.  An anterior longitudinal incision was made followed by a standard mid vastus approach. A moderate effusion was evacuated.  The deep fibers of the medial collateral ligament were elevated in a subperiosteal fashion off the medial flare of the tibia so as to maintain a continuous soft tissue sleeve. The patella was subluxed laterally and the patellofemoral ligament was incised. Inspection of the knee demonstrated severe degenerative changes with evidence of eburnated bone to the medial compartment. Prominent osteophytes were debrided using a rongeur. Anterior and posterior cruciate ligaments were excised.  Two 4-mm Schanz pins were inserted into the femur and into the tibia for attachment of the array of spheres used for computer-assisted navigation.  The hip center was identified using circumduction technique. Distal landmarks were mapped using the computer. The distal femur and proximal tibia were mapped using the computer. The distal femoral cutting guide was positioned using computer-assisted navigation so as to achieve a 5-degree distal valgus cut. The cut was performed and verified using the computer. Distal femur was sized and it was felt that a size four femoral component was appropriate. A size four cutting guide was positioned using computer-assisted navigation and the anterior cut was performed and verified using the computer. This was followed by completion of the posterior and chamfer cuts. Femoral cutting guide for the central box was then positioned and the central box cut was performed.   Attention was then directed to the proximal tibia. Medial and lateral menisci were excised. The extramedullary tibial cutting guide was positioned using computer-assisted navigation so as to achieve 0-degree varus valgus alignment and 0-degree posterior slope. Cut was performed and  verified using the computer. The proximal  tibia was sized and it was felt that a size three tibial tray was appropriate. Tibial and femoral trials were inserted followed by insertion of a 10-mm polyethylene insert. Good medial and lateral soft tissue balancing was appreciated both in full extension and in 90 degrees of flexion. The patella was then cut and prepared so as to accommodate a 38-mm three-peg oval dome patella. Patellar trial was placed and the knee was placed through a range of motion with excellent patellar tracking appreciated.   The femoral trial was removed. The central post hole for the tibial component was reamed followed by insertion of a keel punch. The tibial trial was then removed. The cut surfaces of bone were irrigated with copious amounts of normal saline with antibiotic solution using pulsatile lavage and then suctioned dry. Polymethyl methacrylate cement with gentamicin was mixed in the usual sterile fashion using a vacuum mixer. Gentamicin cement was used due to the patient's positive MRSA nasal swab. Cement was then applied to the cut surface of the tibia as well as along the undersurface of a size three MBT tibial component. The tibial component was positioned and impacted into place. Excess cement was removed using [employer] fast streamer. Cement was then applied to the cut surface of the femur as well as along the posterior flanges of size four posterior stabilized femoral component. Femoral component was positioned and impacted into place. Excess cement was removed using [employer] fast streamer. A 10-mm polyethylene trial was inserted and the knee was brought into full extension with steady axial compression applied. Finally, cement was applied to the backside of a 38-mm three peg oval dome patella and the patellar component was positioned and patellar clamp applied. Excess cement was removed using [employer] fast streamer.   After adequate curing of the cement, the tourniquet was deflated after a total  tourniquet time of 83 minutes. Hemostasis was achieved using electrocautery. The knee was irrigated with copious amounts of normal saline with antibiotic solution using pulsatile lavage and then suctioned dry. The knee was inspected for any residual cement debris. 30 mL of 0.25% Marcaine with epinephrine was injected along the posterior capsule. A 10-mm stabilized rotating platform polyethylene insert was inserted and the knee was placed through a range of motion. Excellent medial and lateral soft tissue balancing was appreciated both in full extension and in 90 degrees of flexion. Excellent patellar tracking was appreciated.   Two medium drains were placed in the wound bed and brought out through a separate stab incision to be attached to a reinfusion system. The medial parapatellar portion of the incision was reapproximated using interrupted sutures of #1 Vicryl. The subcutaneous tissue was approximated in layers using first #0 Vicryl followed by #2-0 Vicryl. Skin was closed with skin staples. Sterile dressing was applied.   The patient tolerated the procedure well. She was transported to the recovery room in stable condition.    ____________________________ Laurice Record. Holley Bouche., MD [email or web address] D: 12/27/2011 14:28:19 ET T: 12/27/2011 16:49:11 ET JOB#: [id number]  cc: Jeneen Rinks P. Holley Bouche., MD, <Dictator> JAMES P Holley Bouche MD ELECTRONICALLY SIGNED 12/30/2011 20:31

## 2015-04-16 ENCOUNTER — Other Ambulatory Visit: Payer: Self-pay | Admitting: Family Medicine

## 2015-04-16 DIAGNOSIS — Z1231 Encounter for screening mammogram for malignant neoplasm of breast: Secondary | ICD-10-CM

## 2015-04-30 ENCOUNTER — Other Ambulatory Visit: Payer: Self-pay | Admitting: *Deleted

## 2015-04-30 MED ORDER — DIGOXIN 125 MCG PO TABS: 0.1250 mg | ORAL_TABLET | Freq: Every day | ORAL | Status: DC

## 2015-05-08 ENCOUNTER — Ambulatory Visit
Admission: RE | Admit: 2015-05-08 | Discharge: 2015-05-08 | Disposition: A | Discharge status: Home / Self Care | Payer: BLUE CROSS/BLUE SHIELD | Source: Ambulatory Visit | Attending: Family Medicine | Admitting: Family Medicine

## 2015-05-08 ENCOUNTER — Other Ambulatory Visit: Payer: Self-pay | Admitting: Family Medicine

## 2015-05-08 DIAGNOSIS — Z9882 Breast implant status: Secondary | ICD-10-CM | POA: Insufficient documentation

## 2015-05-08 DIAGNOSIS — Z1231 Encounter for screening mammogram for malignant neoplasm of breast: Secondary | ICD-10-CM | POA: Diagnosis present

## 2015-05-08 DIAGNOSIS — R922 Inconclusive mammogram: Secondary | ICD-10-CM | POA: Diagnosis not present

## 2015-05-22 ENCOUNTER — Other Ambulatory Visit: Payer: Self-pay

## 2015-05-22 DIAGNOSIS — M1990 Unspecified osteoarthritis, unspecified site: Secondary | ICD-10-CM | POA: Insufficient documentation

## 2015-05-22 MED ORDER — CELECOXIB 200 MG PO CAPS: 200.0000 mg | ORAL_CAPSULE | Freq: Every day | ORAL | Status: DC

## 2015-06-02 ENCOUNTER — Other Ambulatory Visit: Payer: Self-pay

## 2015-06-20 ENCOUNTER — Other Ambulatory Visit: Payer: Self-pay | Admitting: *Deleted

## 2015-06-20 MED ORDER — APIXABAN 5 MG PO TABS: 5.0000 mg | ORAL_TABLET | Freq: Two times a day (BID) | ORAL | Status: DC

## 2015-06-23 ENCOUNTER — Other Ambulatory Visit: Payer: Self-pay

## 2015-06-23 DIAGNOSIS — I152 Hypertension secondary to endocrine disorders: Secondary | ICD-10-CM | POA: Insufficient documentation

## 2015-06-23 DIAGNOSIS — I10 Essential (primary) hypertension: Secondary | ICD-10-CM | POA: Insufficient documentation

## 2015-07-02 ENCOUNTER — Other Ambulatory Visit: Payer: Self-pay

## 2015-07-02 DIAGNOSIS — I10 Essential (primary) hypertension: Secondary | ICD-10-CM

## 2015-07-02 MED ORDER — METOPROLOL SUCCINATE ER 25 MG PO TB24: 12.5000 mg | ORAL_TABLET | Freq: Every day | ORAL | Status: DC

## 2015-07-29 ENCOUNTER — Other Ambulatory Visit: Payer: Self-pay

## 2015-07-29 MED ORDER — DIGOXIN 125 MCG PO TABS
0.1250 mg | ORAL_TABLET | Freq: Every day | ORAL | Status: DC
Start: 2015-07-29 — End: 2015-11-27

## 2015-07-29 NOTE — Telephone Encounter (Signed)
Refill sent for digoxin. 

## 2015-08-14 ENCOUNTER — Ambulatory Visit: Payer: BLUE CROSS/BLUE SHIELD | Admitting: Cardiovascular Disease

## 2015-09-01 ENCOUNTER — Ambulatory Visit (INDEPENDENT_AMBULATORY_CARE_PROVIDER_SITE_OTHER): Payer: BLUE CROSS/BLUE SHIELD | Admitting: Cardiovascular Disease

## 2015-09-01 ENCOUNTER — Encounter: Payer: Self-pay | Admitting: Cardiovascular Disease

## 2015-09-01 VITALS — BP 128/78 | HR 67 | Ht 68.0 in | Wt 189.0 lb

## 2015-09-01 DIAGNOSIS — I4891 Unspecified atrial fibrillation: Secondary | ICD-10-CM

## 2015-09-01 NOTE — Patient Instructions (Signed)
Medication Instructions:  Your physician has recommended you make the following change in your medication:  STOP taking metoprolol   Labwork: none  Testing/Procedures: Your physician has recommended that you wear a holter monitor. Holter monitors are medical devices that record the heart's electrical activity. Doctors most often use these monitors to diagnose arrhythmias. Arrhythmias are problems with the speed or rhythm of the heartbeat. The monitor is a small, portable device. You can wear one while you do your normal daily activities. This is usually used to diagnose what is causing palpitations/syncope (passing out).    Follow-Up: Your physician wants you to follow-up in: 6 months with Dr. Fletcher Anon.  You will receive a reminder letter in the mail two months in advance. If you don't receive a letter, please call our office to schedule the follow-up appointment.   Any Other Special Instructions Will Be Listed Below (If Applicable).  Cardiac Event Monitoring A cardiac event monitor is a small recording device used to help detect abnormal heart rhythms (arrhythmias). The monitor is used to record heart rhythm when noticeable symptoms such as the following occur:  Fast heartbeats (palpitations), such as heart racing or fluttering.  Dizziness.  Fainting or light-headedness.  Unexplained weakness. The monitor is wired to two electrodes placed on your chest. Electrodes are flat, sticky disks that attach to your skin. The monitor can be worn for up to 30 days. You will wear the monitor at all times, except when bathing.  HOW TO USE YOUR CARDIAC EVENT MONITOR A technician will prepare your chest for the electrode placement. The technician will show you how to place the electrodes, how to work the monitor, and how to replace the batteries. Take time to practice using the monitor before you leave the office. Make sure you understand how to send the information from the monitor to your health care  provider. This requires a telephone with a landline, not a cell phone. You need to:  Wear your monitor at all times, except when you are in water:  Do not get the monitor wet.  Take the monitor off when bathing. Do not swim or use a hot tub with it on.  Keep your skin clean. Do not put body lotion or moisturizer on your chest.  Change the electrodes daily or any time they stop sticking to your skin. You might need to use tape to keep them on.  It is possible that your skin under the electrodes could become irritated. To keep this from happening, try to put the electrodes in slightly different places on your chest. However, they must remain in the area under your left breast and in the upper right section of your chest.  Make sure the monitor is safely clipped to your clothing or in a location close to your body that your health care provider recommends.  Press the button to record when you feel symptoms of heart trouble, such as dizziness, weakness, light-headedness, palpitations, thumping, shortness of breath, unexplained weakness, or a fluttering or racing heart. The monitor is always on and records what happened slightly before you pressed the button, so do not worry about being too late to get good information.  Keep a diary of your activities, such as walking, doing chores, and taking medicine. It is especially important to note what you were doing when you pushed the button to record your symptoms. This will help your health care provider determine what might be contributing to your symptoms. The information stored in your monitor will  be reviewed by your health care provider alongside your diary entries.  Send the recorded information as recommended by your health care provider. It is important to understand that it will take some time for your health care provider to process the results.  Change the batteries as recommended by your health care provider. SEEK IMMEDIATE MEDICAL CARE IF:     You have chest pain.  You have extreme difficulty breathing or shortness of breath.  You develop a very fast heartbeat that persists.  You develop dizziness that does not go away.  You faint or constantly feel you are about to faint. Document Released: 08/31/2008 Document Revised: 04/08/2014 Document Reviewed: 05/21/2013 [facility] Patient Information 2015 [place], [place]. This information is not intended to replace advice given to you by your health care provider. Make sure you discuss any questions you have with your health care provider.

## 2015-09-01 NOTE — Progress Notes (Signed)
Primary care provider: Maurine Minister, FNP  HPI  This is a pleasant 74 year old Caucasian female who is here today for a follow-up regarding  atrial fibrillation.  She has borderline diabetes and hyperlipidemia not requiring medications. She smoked in the past more than 30 years ago. There is no family history of coronary artery disease or arrhythmia.  She was diagnosed with symptomatic atrial fibrillation in 2015. She was started on small dose Toprol for rate control. Heart rate was not well controlled on Toprol with borderline low blood pressure. Thus, I added digoxin.She underwent a nuclear stress test in November 2015 which showed no evidence of ischemia with normal ejection fraction. Echocardiogram showed normal EF with mild to moderate mitral regurgitation and mildly dilated left atrium. She underwent hemorrhoid surgery by Dr. Charissa Bash without complications. She denies any bleeding issues with anticoagulation. She continues to have significant fatigue every time she takes Toprol. She is not aware of palpitations.  Allergies  Allergen Reactions  . [name] [Moxifloxacin Hcl In Nacl]   . Bimatoprost     Other reaction(s): Other (See Comments) Made my eyes red, and hurt  . Brimonidine     Other reaction(s): Other (See Comments) Made my eyes red, and hurt  . Keflex [Cephalexin]     headache  . Mobic [Meloxicam] Other (See Comments)    headache  . Propofol     Other reaction(s): Other (See Comments) Had difficulty waking up  . Travoprost   . Vicodin [Hydrocodone-Acetaminophen] Nausea And Vomiting    Sick on stomach Sick on stomach     Current Outpatient Prescriptions on File Prior to Visit  Medication Sig Dispense Refill  . apixaban (ELIQUIS) 5 MG TABS tablet Take 1 tablet (5 mg total) by mouth 2 (two) times daily. 60 tablet 3  . calcium carbonate (OS-CAL) 600 MG TABS Take 600 mg by mouth daily with breakfast.     . celecoxib (CELEBREX) 200 MG capsule Take 1 capsule (200 mg total)  by mouth daily. 30 capsule 5  . cycloSPORINE (RESTASIS) 0.05 % ophthalmic emulsion Place 1 drop into both eyes 2 (two) times daily.     . digoxin (LANOXIN) 0.125 MG tablet Take 1 tablet (0.125 mg total) by mouth daily. 30 tablet 3  . dorzolamide-timolol (COSOPT) 22.3-6.8 MG/ML ophthalmic solution Place 1 drop into the right eye 2 (two) times daily.     Marland Kitchen estrogens, conjugated, (PREMARIN) 0.625 MG tablet Take 0.625 mg by mouth daily.     . Magnesium 400 MG CAPS Take 400 mg by mouth 2 (two) times daily.    . metoprolol succinate (TOPROL-XL) 25 MG 24 hr tablet Take 0.5 tablets (12.5 mg total) by mouth daily. 45 tablet 3  . Multiple Vitamin (MULTIVITAMIN) tablet Take 1 tablet by mouth daily.    . Omega-3 Fatty Acids (FISH OIL) 1200 MG CAPS Take 1,200 mg by mouth 2 (two) times daily.    Marland Kitchen omeprazole (PRILOSEC) 40 MG capsule Take 40 mg by mouth daily.     No current facility-administered medications on file prior to visit.     Past Medical History  Diagnosis Date  . Arthritis   . Depression   . Hemorrhoids   . History of migraine headaches   . Glaucoma     left eye  . Hypercholesteremia   . Hiatal hernia   . Kidney calculi   . Osteopenia   . Atrial fibrillation 10/01/14     Past Surgical History  Procedure Laterality Date  . Replacement total  knee      bilateral  . Shoulder surgery      right shoulder  . Abdominal hysterectomy    . Foot surgery      bilateral   . Nasal septum surgery    . Colonoscopy  12/29/09    Dr Dionne Milo  . Eye surgery Left     shunt placed  . Hemorrhoid surgery      Removed Growth  . Augmentation mammaplasty Bilateral 2004     History reviewed. No pertinent family history.   Social History   Social History  . Marital Status: Divorced    Spouse Name: N/A  . Number of Children: N/A  . Years of Education: N/A   Occupational History  . Not on file.   Social History Main Topics  . Smoking status: Former Smoker -- 1.00 packs/day for 30 years      Types: Cigarettes  . Smokeless tobacco: Not on file  . Alcohol Use: No  . Drug Use: No  . Sexual Activity: Not on file   Other Topics Concern  . Not on file   Social History Narrative     ROS A 10 point review of system was performed. It is negative other than that mentioned in the history of present illness.     PHYSICAL EXAM   BP 128/78 mmHg  Pulse 67  Ht 5\' 8"  (1.727 m)  Wt 189 lb (85.73 kg)  BMI 28.74 kg/m2 Constitutional: She is oriented to person, place, and time. She appears well-developed and well-nourished. No distress.  HENT: No nasal discharge.  Head: Normocephalic and atraumatic.  Eyes: Pupils are equal and round. Right eye exhibits no discharge. Left eye exhibits no discharge.  Neck: Normal range of motion. Neck supple. No JVD present. No thyromegaly present.  Cardiovascular: Normal rate, irregular rhythm, normal heart sounds. Exam reveals no gallop and no friction rub. No murmur heard.  Pulmonary/Chest: Effort normal and breath sounds normal. No stridor. No respiratory distress. She has no wheezes. She has no rales. She exhibits no tenderness.  Abdominal: Soft. Bowel sounds are normal. She exhibits no distension. There is no tenderness. There is no rebound and no guarding.  Musculoskeletal: Normal range of motion. She exhibits no edema and no tenderness.  Neurological: She is alert and oriented to person, place, and time. Coordination normal.  Skin: Skin is warm and dry. No rash noted. She is not diaphoretic. No erythema. No pallor.  Psychiatric: She has a normal mood and affect. Her behavior is normal. Judgment and thought content normal.     ZCH:YIFOYD fibrillation  -  Nonspecific T-abnormality.   Low voltage -possible pulmonary disease.   ABNORMAL     ASSESSMENT AND PLAN

## 2015-09-01 NOTE — Assessment & Plan Note (Signed)
Rate seems to be reasonably controlled. Nonetheless, she complains of significant fatigue with small dose Toprol. Thus, I elected to discontinue Toprol and continue digoxin. She is tolerating anticoagulation with no side effects. She complains of exertional dyspnea and I have offered an attempted cardioversion. She prefers not to go this route and continue rate control. I requested a 48-hour Holter monitor given that I am stopping metoprolol and I need to know how well the heart rate is controlled. If heart rate is not well controlled, I would consider adding diltiazem.

## 2015-09-02 ENCOUNTER — Ambulatory Visit (INDEPENDENT_AMBULATORY_CARE_PROVIDER_SITE_OTHER): Payer: BLUE CROSS/BLUE SHIELD

## 2015-09-02 DIAGNOSIS — I4891 Unspecified atrial fibrillation: Secondary | ICD-10-CM

## 2015-09-16 ENCOUNTER — Telehealth: Payer: Self-pay

## 2015-09-16 NOTE — Telephone Encounter (Signed)
S/w pt who states she stopped metoprolol after 9/26 OV with Dr. Fletcher Anon. During the 4 days she was not taking it, pt experienced rapid heart rate though she was unsure of how high. Restarted med on 9/30. Heart rate is better controlled however, she continues to experience fatigue while taking it. Pt states Dr. Fletcher Anon stated he could try her on another medication if heart rate was uncontrolled after discontinuing metoprolol.  Per MD notes:  If heart rate is not well controlled, I would consider adding diltiazem  Forward to Ignacia Bayley, NP

## 2015-09-16 NOTE — Telephone Encounter (Signed)
Please have her stop metoprolol and start diltiazem cd 120 mg 1 po daily, # 30 with six refills.  Murray Hodgkins, NP

## 2015-09-17 ENCOUNTER — Other Ambulatory Visit: Payer: Self-pay

## 2015-09-17 MED ORDER — DILTIAZEM HCL ER COATED BEADS 120 MG PO CP24: 120.0000 mg | ORAL_CAPSULE | Freq: Every day | ORAL | Status: DC

## 2015-09-17 NOTE — Telephone Encounter (Signed)
S/w pt of Chris's recommendations. Pt verbalized understanding and will call with update after she takes medication for a week. Prescription submitted to [facility]

## 2015-10-03 ENCOUNTER — Encounter: Payer: Self-pay | Admitting: Sports Medicine

## 2015-10-03 ENCOUNTER — Ambulatory Visit (INDEPENDENT_AMBULATORY_CARE_PROVIDER_SITE_OTHER): Payer: BLUE CROSS/BLUE SHIELD | Admitting: Sports Medicine

## 2015-10-03 DIAGNOSIS — M79671 Pain in right foot: Secondary | ICD-10-CM

## 2015-10-03 DIAGNOSIS — L84 Corns and callosities: Secondary | ICD-10-CM | POA: Diagnosis not present

## 2015-10-03 NOTE — Progress Notes (Signed)
Patient ID: FRANCISCA LANGENDERFER, female   DOB: 31-Jul-1941, 74 y.o.   MRN: [id number] Subjective: AFOMIA BLACKLEY is a 74 y.o. female patient who presents to office for evaluation of Right foot pain. Patient complains of pain at the lesions present Right ball of foot. Patient states that these tend to come back however they have been doing good lately since she has been getting pedicures. Patient denies any other pedal complaints.   Patient Active Problem List   Diagnosis Date Noted  . Hypertension 06/23/2015  . Arthritis 05/22/2015  . Internal hemorrhoid 11/23/2014  . Rectal bleeding 11/23/2014  . Atrial fibrillation, unspecified 10/03/2014  . Pain in the chest 10/03/2014  . Hemorrhoid 10/03/2014  . Palpitations 07/05/2013   Current Outpatient Prescriptions on File Prior to Visit  Medication Sig Dispense Refill  . apixaban (ELIQUIS) 5 MG TABS tablet Take 1 tablet (5 mg total) by mouth 2 (two) times daily. 60 tablet 3  . calcium carbonate (OS-CAL) 600 MG TABS Take 600 mg by mouth daily with breakfast.     . celecoxib (CELEBREX) 200 MG capsule Take 1 capsule (200 mg total) by mouth daily. 30 capsule 5  . cycloSPORINE (RESTASIS) 0.05 % ophthalmic emulsion Place 1 drop into both eyes 2 (two) times daily.     . digoxin (LANOXIN) 0.125 MG tablet Take 1 tablet (0.125 mg total) by mouth daily. 30 tablet 3  . diltiazem (CARDIZEM CD) 120 MG 24 hr capsule Take 1 capsule (120 mg total) by mouth daily. 30 capsule 6  . dorzolamide-timolol (COSOPT) 22.3-6.8 MG/ML ophthalmic solution Place 1 drop into the right eye 2 (two) times daily.     Marland Kitchen estrogens, conjugated, (PREMARIN) 0.625 MG tablet Take 0.625 mg by mouth daily.     . Magnesium 400 MG CAPS Take 400 mg by mouth 2 (two) times daily.    . Multiple Vitamin (MULTIVITAMIN) tablet Take 1 tablet by mouth daily.    . Omega-3 Fatty Acids (FISH OIL) 1200 MG CAPS Take 1,200 mg by mouth 2 (two) times daily.    Marland Kitchen omeprazole (PRILOSEC) 40 MG capsule Take 40 mg  by mouth daily.     No current facility-administered medications on file prior to visit.   Allergies  Allergen Reactions  . [name] [Moxifloxacin Hcl In Nacl]   . Bimatoprost     Other reaction(s): Other (See Comments) Made my eyes red, and hurt  . Brimonidine     Other reaction(s): Other (See Comments) Made my eyes red, and hurt  . Keflex [Cephalexin]     headache  . Mobic [Meloxicam] Other (See Comments)    headache  . Propofol     Other reaction(s): Other (See Comments) Had difficulty waking up  . Travoprost   . Vicodin [Hydrocodone-Acetaminophen] Nausea And Vomiting    Sick on stomach Sick on stomach   Objective:  General: Alert and oriented x3 in no acute distress  Dermatology: Keratotic lesions present 3rd and 4th MTPJ on right and medial 1st MTPJ on right, pain is present with direct pressure to the lesions with a central nucleated core noted, no webspace macerations, no ecchymosis bilateral, all nails x 10 are well manicured.  Vascular: Dorsalis Pedis and Posterior Tibial pedal pulses 2/4, Capillary Fill Time 3 seconds, scant pedal hair growth bilateral, no edema bilateral lower extremities, Temperature gradient within normal limits.  Neurology: Johney Maine sensation intact via light touch bilateral.  Musculoskeletal: Mild tenderness with palpation at the keratotic lesion sites on Right, Muscular strength 5/5  in all groups without pain or limitation on range of motion. Hammertoes/structural deformity with fat pad atrophy is present.  Assessment and Plan: Problem List Items Addressed This Visit    None    Visit Diagnoses    Foot callus    -  Primary    Foot pain, right          -Complete examination performed -Discussed treatement options -Parred keratoic lesions/callus x 3 using a chisel blade without incident - Dispensed metatarsal pad to use daily; if this works well may consider inserts with met pad - Recommend good supportive shoes -Recommend cont with skin  care/emollients -Patient to return to office as needed or sooner if condition worsens.  Landis Martins, DPM

## 2015-11-17 ENCOUNTER — Other Ambulatory Visit: Payer: Self-pay | Admitting: Cardiovascular Disease

## 2015-11-18 ENCOUNTER — Encounter: Payer: Self-pay | Admitting: Physician Assistant

## 2015-11-18 DIAGNOSIS — I482 Chronic atrial fibrillation, unspecified: Secondary | ICD-10-CM | POA: Insufficient documentation

## 2015-11-18 DIAGNOSIS — M8580 Other specified disorders of bone density and structure, unspecified site: Secondary | ICD-10-CM | POA: Insufficient documentation

## 2015-11-18 DIAGNOSIS — T4145XA Adverse effect of unspecified anesthetic, initial encounter: Secondary | ICD-10-CM

## 2015-11-18 DIAGNOSIS — E875 Hyperkalemia: Secondary | ICD-10-CM | POA: Insufficient documentation

## 2015-11-18 DIAGNOSIS — E559 Vitamin D deficiency, unspecified: Secondary | ICD-10-CM | POA: Insufficient documentation

## 2015-11-18 DIAGNOSIS — K449 Diaphragmatic hernia without obstruction or gangrene: Secondary | ICD-10-CM | POA: Insufficient documentation

## 2015-11-18 DIAGNOSIS — E785 Hyperlipidemia, unspecified: Secondary | ICD-10-CM | POA: Insufficient documentation

## 2015-11-18 DIAGNOSIS — E1169 Type 2 diabetes mellitus with other specified complication: Secondary | ICD-10-CM | POA: Insufficient documentation

## 2015-11-18 DIAGNOSIS — N951 Menopausal and female climacteric states: Secondary | ICD-10-CM | POA: Insufficient documentation

## 2015-11-18 HISTORY — DX: Adverse effect of unspecified anesthetic, initial encounter: T41.45XA

## 2015-11-21 ENCOUNTER — Other Ambulatory Visit: Payer: Self-pay | Admitting: Family Medicine

## 2015-11-21 DIAGNOSIS — M1990 Unspecified osteoarthritis, unspecified site: Secondary | ICD-10-CM

## 2015-11-21 NOTE — Telephone Encounter (Signed)
One refill sent.  Needs appt before more can be filled as I have not seen her.

## 2015-11-27 ENCOUNTER — Other Ambulatory Visit: Payer: Self-pay | Admitting: *Deleted

## 2015-11-27 ENCOUNTER — Encounter: Payer: Self-pay | Admitting: Physician Assistant

## 2015-11-27 ENCOUNTER — Ambulatory Visit (INDEPENDENT_AMBULATORY_CARE_PROVIDER_SITE_OTHER): Payer: BLUE CROSS/BLUE SHIELD | Admitting: Physician Assistant

## 2015-11-27 VITALS — BP 130/70 | HR 62 | Temp 97.8°F | Resp 16 | Ht 68.0 in | Wt 188.4 lb

## 2015-11-27 DIAGNOSIS — I10 Essential (primary) hypertension: Secondary | ICD-10-CM

## 2015-11-27 DIAGNOSIS — M8580 Other specified disorders of bone density and structure, unspecified site: Secondary | ICD-10-CM | POA: Diagnosis not present

## 2015-11-27 DIAGNOSIS — Z1239 Encounter for other screening for malignant neoplasm of breast: Secondary | ICD-10-CM | POA: Diagnosis not present

## 2015-11-27 DIAGNOSIS — N951 Menopausal and female climacteric states: Secondary | ICD-10-CM

## 2015-11-27 DIAGNOSIS — Z13220 Encounter for screening for lipoid disorders: Secondary | ICD-10-CM

## 2015-11-27 DIAGNOSIS — Z780 Asymptomatic menopausal state: Secondary | ICD-10-CM

## 2015-11-27 DIAGNOSIS — I482 Chronic atrial fibrillation, unspecified: Secondary | ICD-10-CM

## 2015-11-27 DIAGNOSIS — Z136 Encounter for screening for cardiovascular disorders: Secondary | ICD-10-CM

## 2015-11-27 DIAGNOSIS — Z0000 Encounter for general adult medical examination without abnormal findings: Secondary | ICD-10-CM | POA: Diagnosis not present

## 2015-11-27 MED ORDER — METOPROLOL SUCCINATE ER 25 MG PO TB24: 12.5000 mg | ORAL_TABLET | Freq: Every day | ORAL | Status: DC

## 2015-11-27 MED ORDER — DIGOXIN 125 MCG PO TABS: 0.1250 mg | ORAL_TABLET | Freq: Every day | ORAL | Status: DC

## 2015-11-27 MED ORDER — METOPROLOL SUCCINATE ER 25 MG PO TB24: 25.0000 mg | ORAL_TABLET | Freq: Every day | ORAL | Status: DC

## 2015-11-27 NOTE — Patient Instructions (Signed)

## 2015-11-27 NOTE — Progress Notes (Signed)
Patient: Teresa Harris, Female    DOB: Dec 27, 1940, 74 y.o.   MRN: [id number] Visit Date: 11/27/2015  Today's Provider: [name], PA-C   Chief Complaint  Patient presents with  . Medicare Wellness   Subjective:    Annual wellness visit Teresa Harris is a 74 y.o. female. She feels well. She reports exercising, walks 2-3 times a week for 15-20 minutes. She reports she is sleeping poorly due to her neck stiffness.Patient went to the chiropractor this week (Tuesday) and goes back next week for follow-up.  Patient had her Influenza vaccine given at the pharmacy on 10/08/2015. Last PCP:11/11/14 Colonoscopy: 12/19/2009-Normal, Repeat in 10 yrs.(2021) Mammogram: 05/06/14-Normal BMD: 12/03/2010-T-score -0.4, osteopenia, repeat in 2 years (Dec 2013)  Pap: 12/03/2010- Normal; Patient is s/p complete hysterectomy and oopherectomy secondary to fibroids and endometriosis in 1970s after the birth of her second child.   Review of Systems  Constitutional: Negative.   HENT: Positive for sinus pressure.   Eyes: Positive for photophobia.  Respiratory: Negative.   Cardiovascular: Negative.   Gastrointestinal: Negative.   Endocrine: Negative.   Genitourinary: Negative.   Musculoskeletal: Positive for neck stiffness.  Skin: Negative.   Allergic/Immunologic: Negative.   Neurological: Negative.   Hematological: Negative.   Psychiatric/Behavioral: Negative.     Social History   Social History  . Marital Status: Divorced    Spouse Name: N/A  . Number of Children: N/A  . Years of Education: N/A   Occupational History  . Not on file.   Social History Main Topics  . Smoking status: Former Smoker -- 1.00 packs/day for 30 years    Types: Cigarettes  . Smokeless tobacco: Never Used     Comment: Quit in 2000  . Alcohol Use: No  . Drug Use: No  . Sexual Activity: Not on file   Other Topics Concern  . Not on file   Social History Narrative    Patient Active  Problem List   Diagnosis Date Noted  . Avitaminosis D 11/18/2015  . Osteopenia 11/18/2015  . HLD (hyperlipidemia) 11/18/2015  . High potassium 11/18/2015  . Post menopausal syndrome 11/18/2015  . 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 Atrial fibrillation, chronic ([place]) 11/18/2015  . Adverse effect of anesthesia 11/18/2015  . Hypertension 06/23/2015  . Arthritis 05/22/2015  . Internal hemorrhoid 11/23/2014  . Rectal bleeding 11/23/2014  . Pain in the chest 10/03/2014  . Hemorrhoid 10/03/2014  . Bursitis, trochanteric 07/16/2014  . Chronic infection of sinus 08/10/2013  . Palpitations 07/05/2013    Past Surgical History  Procedure Laterality Date  . Replacement total knee      bilateral  . Shoulder surgery      right shoulder  . Abdominal hysterectomy    . Foot surgery      bilateral   . Nasal septum surgery    . Colonoscopy  12/29/09    Dr Dionne Milo  . Eye surgery Left     shunt placed  . Hemorrhoid surgery      Removed Growth  . Augmentation mammaplasty Bilateral 2004    Her family history includes Arthritis in her father and mother.    Previous Medications   CALCIUM CARBONATE (OS-CAL) 600 MG TABS    Take 600 mg by mouth daily with breakfast.    CELECOXIB (CELEBREX) 200 MG CAPSULE    TAKE 1 CAPSULE BY MOUTH ONCE DAILY.   CYCLOSPORINE (RESTASIS) 0.05 % OPHTHALMIC EMULSION    Place 1 drop into both  eyes 2 (two) times daily.    DIGOXIN (LANOXIN) 0.125 MG TABLET    Take 1 tablet (0.125 mg total) by mouth daily.   DORZOLAMIDE-TIMOLOL (COSOPT) 22.3-6.8 MG/ML OPHTHALMIC SOLUTION    Place 1 drop into the right eye 2 (two) times daily.    ELIQUIS 5 MG TABS TABLET    TAKE 1 TABLET BY MOUTH TWICE DAILY.   ESTROGENS, CONJUGATED, (PREMARIN) 0.625 MG TABLET    Take 0.625 mg by mouth daily.    MAGNESIUM 400 MG CAPS    Take 400 mg by mouth 2 (two) times daily.   MULTIPLE VITAMIN (MULTIVITAMIN) TABLET    Take 1 tablet by mouth daily.   OMEGA-3 FATTY ACIDS (FISH OIL) 1200 MG CAPS    Take  1,200 mg by mouth 2 (two) times daily.   OMEPRAZOLE (PRILOSEC) 40 MG CAPSULE    Take 40 mg by mouth daily.    Patient Care Team: [name], PA-C as PCP - General (Family Medicine) Wellington Hampshire, MD as Consulting Physician (Cardiology) Robert Bellow, MD (General Surgery)     Objective:   Vitals: BP 130/70 mmHg  Pulse 62  Temp(Src) 97.8 F (36.6 C) (Oral)  Resp 16  Ht 5\' 8"  (1.727 m)  Wt 188 lb 6.4 oz (85.458 kg)  BMI 28.65 kg/m2  SpO2 97%  Physical Exam  Constitutional: She is oriented to person, place, and time. She appears well-developed and well-nourished. No distress.  HENT:  Head: Normocephalic and atraumatic.  Right Ear: External ear normal.  Left Ear: External ear normal.  Nose: Nose normal.  Mouth/Throat: Oropharynx is clear and moist. No oropharyngeal exudate.  Eyes: Conjunctivae and EOM are normal. Pupils are equal, round, and reactive to light. Right eye exhibits no discharge. Left eye exhibits no discharge. No scleral icterus.  Neck: Normal range of motion. Neck supple. No JVD present. No tracheal deviation present. No thyromegaly present.  Cardiovascular: Normal rate, normal heart sounds and intact distal pulses.  An irregularly irregular rhythm present. Exam reveals no gallop and no friction rub.   No murmur heard. Pulmonary/Chest: Effort normal and breath sounds normal. No respiratory distress. She has no wheezes. She has no rales. She exhibits no tenderness. Right breast exhibits no inverted nipple, no mass, no nipple discharge, no skin change and no tenderness. Left breast exhibits no inverted nipple, no mass, no nipple discharge, no skin change and no tenderness. Breasts are symmetrical.  Abdominal: Soft. Bowel sounds are normal. She exhibits no distension and no mass. There is no tenderness. There is no rebound and no guarding.  Musculoskeletal: Normal range of motion. She exhibits no edema or tenderness.  Lymphadenopathy:    She has no  cervical adenopathy.  Neurological: She is alert and oriented to person, place, and time.  Skin: Skin is warm and dry. No rash noted. She is not diaphoretic.  Psychiatric: She has a normal mood and affect. Her behavior is normal. Judgment and thought content normal.  Vitals reviewed.   Activities of Daily Living In your present state of health, do you have any difficulty performing the following activities: 11/27/2015  Hearing? N  Vision? Y  Difficulty concentrating or making decisions? N  Walking or climbing stairs? N  Dressing or bathing? N  Doing errands, shopping? N    Fall Risk Assessment Fall Risk  11/27/2015  Falls in the past year? No     Depression Screen PHQ 2/9 Scores 11/27/2015  PHQ - 2 Score 0  Cognitive Testing - 6-CIT  Correct? Score   What year is it? yes 0 0 or 4  What month is it? yes 0 0 or 3  Memorize:    Pia Mau,  43,  [address],  [place],      What time is it? (within 1 hour) yes 0 0 or 3  Count backwards from 20 yes 0 0, 2, or 4  Name the months of the year yes 0 0, 2, or 4  Repeat name & address above yes 0 0, 2, 4, 6, 8, or 10       TOTAL SCORE  0/28   Interpretation:  Normal  Normal (0-7) Abnormal (8-28)   Audit-C Alcohol Use Screening  Question Answer Points  How often do you have alcoholic drink? never 0  On days you do drink alcohol, how many drinks do you typically consume? 0 0  How oftey will you drink 6 or more in a total? never 0  Total Score:  0   A score of 3 or more in women, and 4 or more in men indicates increased risk for alcohol abuse, EXCEPT if all of the points are from question 1.     Assessment & Plan:     Annual Wellness Visit  Reviewed patient's Family Medical History Reviewed and updated list of patient's medical providers Assessment of cognitive impairment was done Assessed patient's functional ability Established a written schedule for health screening [place] Completed and  Reviewed  1. Annual physical exam His exam today was normal I will be checking all labs as below and I will follow-up with her pending lab results. If labs are stable and within normal limits she would not need to be seen for another year. She will will return in one year for her repeat annual physical exam. She is to call the office if she has any acute issues, questions or concerns in the meantime - TSH  2. Essential hypertension Currently stable. I will check labs as below. I will follow-up pending lab results. She is to call the office if she has any acute issues, questions or concerns in the meantime. - CBC with Differential/Platelet - Comprehensive metabolic panel  3. Chronic atrial fibrillation (HCC) Currently stable and followed by cardiology. She does state that her cardiologist does want to perform a cardioversion but she is not quite ready for that at this time. She is currently on digoxin, metoprolol and Eliquis. I will check labs as below. I will follow-up with her pending lab results. She is to call the office if she has any acute issues, questions or concerns in the meantime. - CBC with Differential/Platelet - Comprehensive metabolic panel - Digoxin level - metoprolol succinate (TOPROL-XL) 25 MG 24 hr tablet; Take 0.5 tablets (12.5 mg total) by mouth daily.  Dispense: 30 tablet; Refill: 6  4. Osteopenia Most recent bone density study that could be found in our records was done on 12/03/2010. She was noted to be osteopenic with a T score of -0.4. This was to have been repeated in 2 years. She states that she feels she has had one since 2011 but there were no records to be found. I will go ahead and order a bone density study. I will follow-up with her pending the results. She is to continue her calcium-vitamin D supplement 600 mg daily. - Comprehensive metabolic panel - DG Bone Density; Future  5. Post menopausal syndrome Currently stable on Procrit and 0.65 mg.  I will check  labs as below and follow-up pending lab results. - DG Bone Density; Future  6. Encounter for lipid screening for cardiovascular disease Due to her other comorbidities for CAD I will check her cholesterol panel. Cholesterol has been normal in the past. I will follow-up with her pending the results. If cholesterol is normal she will not need this rechecked for 1 year at her next annual physical exam. - Lipid panel  7. Breast cancer screening Breast exam done in the office today was normal. She does have bilateral implants. Mammogram ordered as below. She was given a card for [facility] breast clinic so that she may call and schedule the mammogram at her convenience. - MM Digital Screening; Future  Exercise Activities and Dietary recommendations Goals    None      Immunization History  Administered Date(s) Administered  . Influenza-Unspecified 10/08/2015  . Pneumococcal Conjugate-13 11/11/2014  . Pneumococcal Polysaccharide-23 10/27/2012  . Tdap 12/03/2010    Health Maintenance  Topic Date Due  . DEXA SCAN  01/08/2007  . INFLUENZA VACCINE  07/06/2016  . MAMMOGRAM  05/07/2017  . COLONOSCOPY  12/30/2019  . TETANUS/TDAP  12/03/2020  . ZOSTAVAX  Addressed  . PNA vac Low Risk Adult  Completed      Discussed health benefits of physical activity, and encouraged her to engage in regular exercise appropriate for her age and condition.    ------------------------------------------------------------------------------------------------------------

## 2015-11-28 ENCOUNTER — Telehealth: Payer: Self-pay

## 2015-11-28 LAB — COMPREHENSIVE METABOLIC PANEL
ALT: 23 IU/L (ref 0–32)
AST: 17 IU/L (ref 0–40)
Albumin/Globulin Ratio: 1.4 (ref 1.1–2.5)
Albumin: 4.1 g/dL (ref 3.5–4.8)
Alkaline Phosphatase: 62 IU/L (ref 39–117)
BUN/Creatinine Ratio: 20 (ref 11–26)
BUN: 13 mg/dL (ref 8–27)
Bilirubin Total: 0.5 mg/dL (ref 0.0–1.2)
CO2: 26 mmol/L (ref 18–29)
Calcium: 9.3 mg/dL (ref 8.7–10.3)
Chloride: 102 mmol/L (ref 96–106)
Creatinine, Ser: 0.66 mg/dL (ref 0.57–1.00)
GFR calc Af Amer: 101 mL/min/{1.73_m2} (ref >59)
GFR calc non Af Amer: 88 mL/min/{1.73_m2} (ref >59)
Globulin, Total: 3 g/dL (ref 1.5–4.5)
Glucose: 106 mg/dL — ABNORMAL HIGH (ref 65–99)
Potassium: 4.9 mmol/L (ref 3.5–5.2)
Sodium: 140 mmol/L (ref 134–144)
Total Protein: 7.1 g/dL (ref 6.0–8.5)

## 2015-11-28 LAB — CBC WITH DIFFERENTIAL/PLATELET
Basophils Absolute: 0 10*3/uL (ref 0.0–0.2)
Basos: 1 %
EOS (ABSOLUTE): 0.1 10*3/uL (ref 0.0–0.4)
Eos: 2 %
Hematocrit: 43.2 % (ref 34.0–46.6)
Hemoglobin: 14.6 g/dL (ref 11.1–15.9)
Immature Grans (Abs): 0 10*3/uL (ref 0.0–0.1)
Immature Granulocytes: 0 %
Lymphocytes Absolute: 1.5 10*3/uL (ref 0.7–3.1)
Lymphs: 29 %
MCH: 32.1 pg (ref 26.6–33.0)
MCHC: 33.8 g/dL (ref 31.5–35.7)
MCV: 95 fL (ref 79–97)
Monocytes Absolute: 0.5 10*3/uL (ref 0.1–0.9)
Monocytes: 9 %
Neutrophils Absolute: 3.1 10*3/uL (ref 1.4–7.0)
Neutrophils: 59 %
Platelets: 199 10*3/uL (ref 150–379)
RBC: 4.55 x10E6/uL (ref 3.77–5.28)
RDW: 13.1 % (ref 12.3–15.4)
WBC: 5.2 10*3/uL (ref 3.4–10.8)

## 2015-11-28 LAB — LIPID PANEL
Chol/HDL Ratio: 4 ratio units (ref 0.0–4.4)
Cholesterol, Total: 194 mg/dL (ref 100–199)
HDL: 48 mg/dL (ref >39)
LDL Calculated: 98 mg/dL (ref 0–99)
Triglycerides: 240 mg/dL — ABNORMAL HIGH (ref 0–149)
VLDL Cholesterol Cal: 48 mg/dL — ABNORMAL HIGH (ref 5–40)

## 2015-11-28 LAB — TSH: TSH: 1.05 u[IU]/mL (ref 0.450–4.500)

## 2015-11-28 LAB — DIGOXIN LEVEL: Digoxin, Serum: 0.6 ng/mL — ABNORMAL LOW (ref 0.9–2.0)

## 2015-11-28 NOTE — Telephone Encounter (Signed)
-----   Message from [name], [place] sent at 11/28/2015  8:49 AM EST ----- All labs are within normal limits and stable.  Triglycerides are still elevated but are exact same as last year.  Continue current medical treatment plans.  We will recheck labs in one year.  Thanks! -JB

## 2015-11-28 NOTE — Telephone Encounter (Signed)
Pt advised.   Thanks,   -Vishaal Strollo  

## 2015-12-22 ENCOUNTER — Other Ambulatory Visit: Payer: Self-pay | Admitting: Physician Assistant

## 2015-12-22 ENCOUNTER — Ambulatory Visit: Payer: BLUE CROSS/BLUE SHIELD

## 2015-12-22 ENCOUNTER — Other Ambulatory Visit: Payer: BLUE CROSS/BLUE SHIELD

## 2015-12-22 DIAGNOSIS — M1990 Unspecified osteoarthritis, unspecified site: Secondary | ICD-10-CM

## 2015-12-22 MED ORDER — CELECOXIB 200 MG PO CAPS: ORAL_CAPSULE | ORAL | Status: DC

## 2015-12-30 ENCOUNTER — Other Ambulatory Visit: Payer: Self-pay | Admitting: Physician Assistant

## 2015-12-30 DIAGNOSIS — M5012 Mid-cervical disc disorder, unspecified level: Secondary | ICD-10-CM

## 2016-01-07 DIAGNOSIS — H401123 Primary open-angle glaucoma, left eye, severe stage: Secondary | ICD-10-CM | POA: Diagnosis not present

## 2016-01-08 DIAGNOSIS — E2839 Other primary ovarian failure: Secondary | ICD-10-CM | POA: Diagnosis not present

## 2016-01-08 DIAGNOSIS — M8580 Other specified disorders of bone density and structure, unspecified site: Secondary | ICD-10-CM | POA: Diagnosis not present

## 2016-01-08 DIAGNOSIS — Z780 Asymptomatic menopausal state: Secondary | ICD-10-CM | POA: Diagnosis not present

## 2016-01-08 DIAGNOSIS — Z13820 Encounter for screening for osteoporosis: Secondary | ICD-10-CM | POA: Diagnosis not present

## 2016-01-14 DIAGNOSIS — M5412 Radiculopathy, cervical region: Secondary | ICD-10-CM | POA: Diagnosis not present

## 2016-01-14 DIAGNOSIS — G5603 Carpal tunnel syndrome, bilateral upper limbs: Secondary | ICD-10-CM | POA: Diagnosis not present

## 2016-01-19 ENCOUNTER — Ambulatory Visit
Admission: RE | Admit: 2016-01-19 | Discharge: 2016-01-19 | Disposition: A | Discharge status: Home / Self Care | Payer: PPO | Source: Ambulatory Visit | Attending: Physician Assistant | Admitting: Physician Assistant

## 2016-01-19 DIAGNOSIS — M4802 Spinal stenosis, cervical region: Secondary | ICD-10-CM | POA: Diagnosis not present

## 2016-01-19 DIAGNOSIS — M47812 Spondylosis without myelopathy or radiculopathy, cervical region: Secondary | ICD-10-CM | POA: Diagnosis not present

## 2016-01-19 DIAGNOSIS — M5012 Mid-cervical disc disorder, unspecified level: Secondary | ICD-10-CM | POA: Diagnosis not present

## 2016-01-19 DIAGNOSIS — M542 Cervicalgia: Secondary | ICD-10-CM | POA: Diagnosis not present

## 2016-01-19 DIAGNOSIS — M4692 Unspecified inflammatory spondylopathy, cervical region: Secondary | ICD-10-CM | POA: Diagnosis not present

## 2016-01-19 DIAGNOSIS — M50221 Other cervical disc displacement at C4-C5 level: Secondary | ICD-10-CM | POA: Diagnosis not present

## 2016-01-19 DIAGNOSIS — Q7649 Other congenital malformations of spine, not associated with scoliosis: Secondary | ICD-10-CM | POA: Insufficient documentation

## 2016-01-29 DIAGNOSIS — G5603 Carpal tunnel syndrome, bilateral upper limbs: Secondary | ICD-10-CM | POA: Diagnosis not present

## 2016-01-30 ENCOUNTER — Ambulatory Visit (INDEPENDENT_AMBULATORY_CARE_PROVIDER_SITE_OTHER): Payer: PPO | Admitting: Sports Medicine

## 2016-01-30 ENCOUNTER — Ambulatory Visit: Payer: PPO | Admitting: Sports Medicine

## 2016-01-30 ENCOUNTER — Encounter: Payer: Self-pay | Admitting: Sports Medicine

## 2016-01-30 DIAGNOSIS — M79671 Pain in right foot: Secondary | ICD-10-CM

## 2016-01-30 DIAGNOSIS — L84 Corns and callosities: Secondary | ICD-10-CM

## 2016-01-30 NOTE — Progress Notes (Signed)
Patient ID: ACCALIA URUCHIMA, female   DOB: 09/05/1941, 75 y.o.   MRN: [id number]  Subjective: LENIS KOZAR is a 75 y.o. female patient who presents to office for evaluation of Right foot pain at the lesions present Right ball of foot. Patient states that the lesions came back a little quicker and she has been trying to keep them maintained by getting regular pedicures. Patient denies any other pedal complaints.   Patient Active Problem List   Diagnosis Date Noted  . Avitaminosis D 11/18/2015  . Osteopenia 11/18/2015  . HLD (hyperlipidemia) 11/18/2015  . High potassium 11/18/2015  . Post menopausal syndrome 11/18/2015  . 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 Atrial fibrillation, chronic ([place]) 11/18/2015  . Adverse effect of anesthesia 11/18/2015  . Hypertension 06/23/2015  . Arthritis 05/22/2015  . Internal hemorrhoid 11/23/2014  . Rectal bleeding 11/23/2014  . Pain in the chest 10/03/2014  . Hemorrhoid 10/03/2014  . Bursitis, trochanteric 07/16/2014  . Chronic infection of sinus 08/10/2013  . Palpitations 07/05/2013   Current Outpatient Prescriptions on File Prior to Visit  Medication Sig Dispense Refill  . calcium carbonate (OS-CAL) 600 MG TABS Take 600 mg by mouth daily with breakfast.     . celecoxib (CELEBREX) 200 MG capsule TAKE 1 CAPSULE BY MOUTH ONCE DAILY. 30 capsule 6  . cycloSPORINE (RESTASIS) 0.05 % ophthalmic emulsion Place 1 drop into both eyes 2 (two) times daily.     . digoxin (LANOXIN) 0.125 MG tablet Take 1 tablet (0.125 mg total) by mouth daily. 30 tablet 3  . dorzolamide-timolol (COSOPT) 22.3-6.8 MG/ML ophthalmic solution Place 1 drop into the right eye 2 (two) times daily.     Marland Kitchen ELIQUIS 5 MG TABS tablet TAKE 1 TABLET BY MOUTH TWICE DAILY. 60 tablet 6  . estrogens, conjugated, (PREMARIN) 0.625 MG tablet Take 0.625 mg by mouth daily.     . Magnesium 400 MG CAPS Take 400 mg by mouth 2 (two) times daily.    . metoprolol succinate (TOPROL-XL) 25 MG 24 hr tablet  Take 0.5 tablets (12.5 mg total) by mouth daily. 30 tablet 6  . Multiple Vitamin (MULTIVITAMIN) tablet Take 1 tablet by mouth daily.    . Omega-3 Fatty Acids (FISH OIL) 1200 MG CAPS Take 1,200 mg by mouth 2 (two) times daily.    Marland Kitchen omeprazole (PRILOSEC) 40 MG capsule Take 40 mg by mouth daily.     No current facility-administered medications on file prior to visit.   Allergies  Allergen Reactions  . Prednisone Shortness Of Breath  . [name] [Moxifloxacin Hcl In Nacl]   . Bimatoprost     Other reaction(s): Other (See Comments) Made my eyes red, and hurt  . Brimonidine     Other reaction(s): Other (See Comments) Made my eyes red, and hurt  . Keflex [Cephalexin]     headache  . Mobic [Meloxicam] Other (See Comments)    headache  . Moxifloxacin     Other reaction(s): Headache  . Propofol     Other reaction(s): Other (See Comments) Had difficulty waking up  . Travoprost   . Vicodin [Hydrocodone-Acetaminophen] Nausea And Vomiting    Sick on stomach Sick on stomach   Objective:  General: Alert and oriented x3 in no acute distress  Dermatology: Keratotic lesions present 3rd and 4th MTPJ on right and medial 1st MTPJ on right, pain is present with direct pressure to the lesions with a central nucleated core noted, no webspace macerations, no ecchymosis bilateral, all nails x 10 are  well manicured.  Vascular: Dorsalis Pedis and Posterior Tibial pedal pulses 2/4, Capillary Fill Time 3 seconds, scant pedal hair growth bilateral, no edema bilateral lower extremities, Temperature gradient within normal limits.  Neurology: Johney Maine sensation intact via light touch bilateral.  Musculoskeletal: Mild tenderness with palpation at the keratotic lesion sites on Right, Muscular strength 5/5 in all groups without pain or limitation on range of motion. Hammertoes/structural deformity with fat pad atrophy is present.  Assessment and Plan: Problem List Items Addressed This Visit    None    Visit  Diagnoses    Foot callus    -  Primary    Foot pain, right          -Complete examination performed -Discussed treatement options -Parred keratoic lesions/callus x 3 using a chisel blade without incident - Recommend good supportive shoes -Recommend cont with skin care/emollients/pumice stone -Patient to return to office as needed or sooner if condition worsens.  Landis Martins, DPM

## 2016-02-09 ENCOUNTER — Encounter: Payer: Self-pay | Admitting: *Deleted

## 2016-02-09 ENCOUNTER — Other Ambulatory Visit: Payer: PPO

## 2016-02-09 NOTE — Pre-Procedure Instructions (Signed)
Notes Recorded by Tracie Harrier, RN on 10/25/2014 at 4:16 PM Reviewed results with patient.   ------  Notes Recorded by Wellington Hampshire, MD on 10/23/2014 at 1:56 PM Inform patient that echo was fine with only mild abnormalities. EF was normal with mild-moderate mitral valve regurgitation. This can be monitored. ------  Notes Recorded by Tracie Harrier, RN on 10/23/2014 at 8:33 AM RN reviewed. Forwarded to MD.    Narrative                *Teresa Harris            [place],  [id number]              [phone]  ------------------------------------------------------------------- Transthoracic Echocardiography  Patient:  Teresa Harris, Teresa Harris MR #:    [id number] Study Date: 10/22/2014 Gender:   F Age:    75 Height:   172.7 cm Weight:   83.9 kg BSA:    2.03 m^2 Pt. Status: Room:  ATTENDING  Kathlyn Sacramento, MD ORDERING   Kathlyn Sacramento, MD REFERRING  Kathlyn Sacramento, MD SONOGRAPHER Timmothy Sours, RDCS, RVT PERFORMING  [facility] [place]  cc:  ------------------------------------------------------------------- LV EF: 55% -  60%  ------------------------------------------------------------------- History:  PMH: Acquired from the patient and from the patient&'s chart. Chest pain. Dyspnea. Atrial fibrillation. Risk factors: Former tobacco use. Diabetes mellitus. Dyslipidemia.  ------------------------------------------------------------------- Study Conclusions  - Left ventricle: The cavity size was normal. There was mild concentric hypertrophy. Systolic function was normal. The estimated ejection fraction was in the range of 55% to 60%. Wall motion was normal; there were no regional wall motion abnormalities. - Mitral valve: There was mild to moderate regurgitation. - Left atrium: The atrium was mildly dilated. - Tricuspid  valve: There was mild regurgitation. - Pulmonary arteries: PA peak pressure: 35 mm Hg (S).  Transthoracic echocardiography. M-mode, complete 2D, spectral Doppler, and color Doppler. Birthdate: Patient birthdate: 01-03-1941. Age: Patient is 75 yr old. Sex: Gender: female. BMI: 28.1 kg/m^2. Blood pressure:   120/78 Patient status: Outpatient. Study date: Study date: 10/22/2014. Study time: 02:49 PM.  -------------------------------------------------------------------  ------------------------------------------------------------------- Left ventricle: The cavity size was normal. There was mild concentric hypertrophy. Systolic function was normal. The estimated ejection fraction was in the range of 55% to 60%. Wall motion was normal; there were no regional wall motion abnormalities. The study was not technically sufficient to allow evaluation of LV diastolic dysfunction due to atrial fibrillation.  ------------------------------------------------------------------- Aortic valve:  Trileaflet; normal thickness leaflets. Mobility was not restricted. Doppler: Transvalvular velocity was within the normal range. There was no stenosis. There was no regurgitation. VTI ratio of LVOT to aortic valve: 0.79. Valve area (VTI): 2.72 cm^2. Indexed valve area (VTI): 1.34 cm^2/m^2. Valve area (Vmax): 2.67 cm^2. Indexed valve area (Vmax): 1.32 cm^2/m^2. Mean velocity ratio of LVOT to aortic valve: 0.65. Valve area (Vmean): 2.25 cm^2. Indexed valve area (Vmean): 1.11 cm^2/m^2.  Mean gradient (S): 2 mm Hg. Peak gradient (S): 5 mm Hg.  ------------------------------------------------------------------- Aorta: Aortic root: The aortic root was normal in size.  ------------------------------------------------------------------- Mitral valve:  Structurally normal valve.  Mobility was not restricted. Doppler: Transvalvular velocity was within the normal range. There was no evidence for  stenosis. There was mild to moderate regurgitation.  Peak gradient (D): 4 mm Hg.  ------------------------------------------------------------------- Left atrium: The atrium was mildly dilated.  ------------------------------------------------------------------- Right ventricle: The cavity size was normal. Wall thickness was normal. Systolic  function was normal.  ------------------------------------------------------------------- Pulmonic valve:  Doppler: Transvalvular velocity was within the normal range. There was no evidence for stenosis. There was trivial regurgitation.  ------------------------------------------------------------------- Tricuspid valve:  Structurally normal valve.  Doppler: Transvalvular velocity was within the normal range. There was mild regurgitation.  ------------------------------------------------------------------- Pulmonary artery:  The main pulmonary artery was normal-sized. Systolic pressure was within the normal range.  ------------------------------------------------------------------- Right atrium: The atrium was normal in size.  ------------------------------------------------------------------- Pericardium: There was no pericardial effusion.  ------------------------------------------------------------------- Systemic veins: Inferior vena cava: The vessel was dilated. The respirophasic diameter changes were in the normal range (>= 50%), consistent with elevated central venous pressure.  ------------------------------------------------------------------- Measurements  Left ventricle              Value     Reference LV ID, ED, PLAX chordal          47.5 mm    43 - 52 LV ID, ES, PLAX chordal          33  mm    23 - 38 LV fx shortening, PLAX chordal      31  %    >=29 LV PW thickness, ED            10.6 mm    --------- IVS/LV PW ratio, ED             1.23      <=1.3 Stroke volume, 2D             62  ml    --------- Stroke volume/bsa, 2D           31  ml/m^2  --------- LV ejection fraction, 1-p A4C       58  %    --------- LV end-diastolic volume, 2-p       67  ml    --------- LV end-systolic volume, 2-p        26  ml    --------- LV ejection fraction, 2-p         61  %    --------- Stroke volume, 2-p            41  ml    --------- LV end-diastolic volume/bsa, 2-p     33  ml/m^2  --------- LV end-systolic volume/bsa, 2-p      13  ml/m^2  --------- Stroke volume/bsa, 2-p          20.2 ml/m^2  ---------  Ventricular septum            Value     Reference IVS thickness, ED             13  mm    ---------  LVOT                   Value     Reference LVOT ID, S                21  mm    --------- LVOT area                 3.46 cm^2   --------- LVOT peak velocity, S           84.2 cm/s   --------- LVOT mean velocity, S           44.4 cm/s   --------- LVOT VTI, S                17.9 cm    --------- LVOT peak gradient, S  3   mm Hg  ---------  Aortic valve               Value     Reference Aortic valve mean velocity, S       68.3 cm/s   --------- Aortic valve VTI, S            22.8 cm    --------- Aortic mean gradient, S          2   mm Hg  --------- Aortic peak gradient, S          5   mm Hg  --------- VTI ratio, LVOT/AV            0.79      --------- Aortic valve area, VTI          2.72 cm^2   --------- Aortic valve area/bsa, VTI        1.34 cm^2/m^2 --------- Aortic valve area, peak velocity      2.67 cm^2   --------- Aortic valve area/bsa, peak        1.32 cm^2/m^2 --------- velocity Velocity ratio, mean, LVOT/AV       0.65      --------- Aortic valve area, mean velocity     2.25 cm^2   --------- Aortic valve area/bsa, mean        1.11 cm^2/m^2 --------- velocity  Aorta                   Value     Reference Aortic root ID, ED            33  mm    ---------  Left atrium                Value     Reference LA ID, A-P, ES              45  mm    --------- LA ID/bsa, A-P          (H)   2.22 cm/m^2  <=2.2 LA volume, S               51  ml    --------- LA volume/bsa, S             25.2 ml/m^2  ---------  Mitral valve               Value     Reference Mitral E-wave peak velocity        99.2 cm/s   --------- Mitral A-wave peak velocity        30.1 cm/s   --------- Mitral deceleration time     (L)   148  ms    150 - 230 Mitral peak gradient, D          4   mm Hg  --------- Mitral E/A ratio, peak          3.3      ---------  Pulmonary arteries            Value     Reference PA pressure, S, DP        (H)   35  mm Hg  <=30  Tricuspid valve              Value     Reference Tricuspid regurg peak velocity      252  cm/s   --------- Tricuspid peak RV-RA gradient       25  mm Hg  ---------  Right ventricle              Value     Reference RV ID, ED, PLAX              30.3 mm    19 - 38  Pulmonic valve              Value     Reference Pulmonic regurg velocity, ED       66.9 cm/s   ---------  Legend: (L) and (H) mark values outside specified reference  range.  ------------------------------------------------------------------- Prepared and Electronically Authenticated by  Kathlyn Sacramento, MD 2015-11-18T08:06:57    Congenital Measurements       Z-score normal range: +/-2   BSA formula: Arneta Cliche Documents:    There are no order-level documents.    Encounter-Level Documents - 10/22/2014:      Electronic signature on 10/22/2014 2:28 PM    Printable Result Report    Result Report    2D Echocardiogram without contrast (Order [id number])  Echocardiography  Order: [id number]   Released By: Sharene Butters  Authorizing: Wellington Hampshire, MD   Date: 10/22/2014  Department: [facility]       Order Information     Order Date/Time Release Date/Time Start Date/Time End Date/Time    10/03/14 10:05 AM 10/22/14 02:29 PM 10/22/14 02:29 PM None      Order Details     Frequency Duration Priority Order Class    None None Routine Clinic Performed      Acc#    [id number]      Order Questions     Question Answer Comment    Type of Echo Complete     Where should this test be performed [facility]     Reason for exam-Echo Atrial Fibrillation 427.31 / I48.91       Associated Diagnoses       ICD-9-CM ICD-10-CM    Atrial fibrillation, unspecified    427.31 I48.91      Order History  Outpatient    Date/Time Action Taken User Additional Information    10/22/14 1429 Release Arie Sabina [id number]    10/23/14 [id number] Result [facility] Interface Final      Verbal Order Info     Action Created on Order Mode Entered by Responsible Provider Signed by Signed on    Ordering 10/03/14 1005 Verbal with Read Back: Cosign Required Tracie Harrier, RN Wellington Hampshire, MD Wellington Hampshire, MD 10/03/14 1237      Order Requisition     2D ECHOCARDIOGRAM WITHOUT CONTRAST (Order [id number]) on 10/22/14     Collection Information      Collected: 10/22/2014 2:49 PM   Resulting Agency: [facility]     2D ECHOCARDIOGRAM WITHOUT CONTRAST (Order [id number]) on 10/22/14

## 2016-02-09 NOTE — Patient Instructions (Signed)
  Your procedure is scheduled on:02-16-16 (MONDAY) Report to [facility] To find out your arrival time please call [phone] between 1PM - 3PM on 02-13-16 (FRIDAY)  Remember: Instructions that are not followed completely may result in serious medical risk, up to and including death, or upon the discretion of your surgeon and anesthesiologist your surgery may need to be rescheduled.    _X___ 1. Do not eat food or drink liquids after midnight. No gum chewing or hard candies.     _X___ 2. No Alcohol for 24 hours before or after surgery.   ____ 3. Bring all medications with you on the day of surgery if instructed.    _X___ 4. Notify your doctor if there is any change in your medical condition     (cold, fever, infections).     Do not wear jewelry, make-up, hairpins, clips or nail polish.  Do not wear lotions, powders, or perfumes. You may wear deodorant.  Do not shave 48 hours prior to surgery. Men may shave face and neck.  Do not bring valuables to the hospital.    [facility] is not responsible for any belongings or valuables.               Contacts, dentures or bridgework may not be worn into surgery.  Leave your suitcase in the car. After surgery it may be brought to your room.  For patients admitted to the hospital, discharge time is determined by your treatment team.   Patients discharged the day of surgery will not be allowed to drive home.   Please read over the following fact sheets that you were given:      __X__ Take these medicines the morning of surgery with A SIP OF WATER:    1. DIGOXIN              2. MAGNESIUM  3. METOPROLOL  4. PRILOSEC  5. TAKE AN EXTRA PRILOSEC ON Sunday NIGHT (02-15-16) BEFORE BED  6.  ____ Fleet Enema (as directed)   _X___ Use CHG Soap as directed  ____ Use inhalers on the day of surgery  ____ Stop metformin 2 days prior to surgery    ____ Take 1/2 of usual insulin dose the night before surgery and none on  the morning of surgery.   ____ Stop Coumadin/Plavix/aspirin-N/A  ____ Stop Anti-inflammatories-CHECK WITH DR ARIDA OR DR HOOTEN ABOUT STOPPING ELOQUIS   _X___ Stop supplements until after surgery-STOP FISH OIL NOW  ____ Bring C-Pap to the hospital.

## 2016-02-10 ENCOUNTER — Telehealth: Payer: Self-pay | Admitting: Cardiovascular Disease

## 2016-02-10 ENCOUNTER — Encounter
Admission: RE | Admit: 2016-02-10 | Discharge: 2016-02-10 | Disposition: A | Discharge status: Home / Self Care | Payer: PPO | Source: Ambulatory Visit | Attending: Orthopedic Surgery | Admitting: Orthopedic Surgery

## 2016-02-10 DIAGNOSIS — G5601 Carpal tunnel syndrome, right upper limb: Secondary | ICD-10-CM | POA: Diagnosis not present

## 2016-02-10 DIAGNOSIS — Z01812 Encounter for preprocedural laboratory examination: Secondary | ICD-10-CM | POA: Insufficient documentation

## 2016-02-10 LAB — SURGICAL PCR SCREEN
MRSA, PCR: NEGATIVE
Staphylococcus aureus: POSITIVE — AB

## 2016-02-10 NOTE — Telephone Encounter (Signed)
Pt scheduled for March 13 carpel tunnel release with Dr. Marry Guan. She takes Eliquis 5mg  BID and inquires how long she needs to hold med prior to surgery. Forward to MD to advise.

## 2016-02-10 NOTE — Telephone Encounter (Signed)
Request for surgical clearance:  1. What type of surgery is being performed? carpal tunnel   2. When is this surgery scheduled?  02/16/16  3. Are there any medications that need to be held prior to surgery and how long? Eliquis not sure how long  4. Name of physician performing surgery? Dr Marry Guan    5. What is your office phone and fax number? [phone] Pt is calling wanting to know when to stop her eliquis Please advise.

## 2016-02-10 NOTE — Telephone Encounter (Signed)
Patient says she held eliquis today and wants to know if she should continue this.  Patient advised that Dr. Fletcher Anon would have to advise and we are waiting on his decision before we can say yes or no.  Patient is concerned since surgery is on Monday.   Please call patient home number if after 5 om and leave a detailed msg.

## 2016-02-11 NOTE — Telephone Encounter (Signed)
Hold Eliquis only 2 days before surgery.

## 2016-02-12 ENCOUNTER — Telehealth: Payer: Self-pay | Admitting: Cardiovascular Disease

## 2016-02-12 NOTE — Telephone Encounter (Signed)
Left detailed message regarding Eliquis w/CB number if questions.

## 2016-02-12 NOTE — Telephone Encounter (Signed)
Pt called back to discuss Eliquis prior to surgery. Reviewed Dr. Tyrell Antonio recommendations. Pt verbalized understanding.

## 2016-02-13 ENCOUNTER — Encounter: Payer: Self-pay | Admitting: *Deleted

## 2016-02-13 NOTE — Pre-Procedure Instructions (Signed)
Telephone Encounter   Teresa Harris (MR# [id number])      Telephone Encounter Info    Author Note Status Last Update User Last Update Date/Time   Wellington Hampshire, MD Signed Wellington Hampshire, MD 02/11/2016 6:47 PM    Telephone Encounter    Expand All Collapse All   Hold Eliquis only 2 days before surgery.

## 2016-02-13 NOTE — Pre-Procedure Instructions (Signed)
Author Note Status Last Update User Last Update Date/Time   Teresa Hampshire, MD Signed Teresa Hampshire, MD 09/01/2015 7:15 PM    Progress Notes    Expand All Collapse All    Primary care provider: Maurine Minister, FNP  HPI  This is a pleasant 75 year old Caucasian female who is here today for a follow-up regarding atrial fibrillation. She has borderline diabetes and hyperlipidemia not requiring medications. She smoked in the past more than 30 years ago. There is no family history of coronary artery disease or arrhythmia.  She was diagnosed with symptomatic atrial fibrillation in 2015. She was started on small dose Toprol for rate control. Heart rate was not well controlled on Toprol with borderline low blood pressure. Thus, I added digoxin.She underwent a nuclear stress test in November 2015 which showed no evidence of ischemia with normal ejection fraction. Echocardiogram showed normal EF with mild to moderate mitral regurgitation and mildly dilated left atrium. She underwent hemorrhoid surgery by Dr. Charissa Bash without complications. She denies any bleeding issues with anticoagulation. She continues to have significant fatigue every time she takes Toprol. She is not aware of palpitations.  Allergies  Allergen Reactions  . [name] [Moxifloxacin Hcl In Nacl]   . Bimatoprost     Other reaction(s): Other (See Comments) Made my eyes red, and hurt  . Brimonidine     Other reaction(s): Other (See Comments) Made my eyes red, and hurt  . Keflex [Cephalexin]     headache  . Mobic [Meloxicam] Other (See Comments)    headache  . Propofol     Other reaction(s): Other (See Comments) Had difficulty waking up  . Travoprost   . Vicodin [Hydrocodone-Acetaminophen] Nausea And Vomiting    Sick on stomach Sick on stomach     Current Outpatient Prescriptions on File Prior to Visit  Medication Sig Dispense Refill  . apixaban (ELIQUIS) 5  MG TABS tablet Take 1 tablet (5 mg total) by mouth 2 (two) times daily. 60 tablet 3  . calcium carbonate (OS-CAL) 600 MG TABS Take 600 mg by mouth daily with breakfast.     . celecoxib (CELEBREX) 200 MG capsule Take 1 capsule (200 mg total) by mouth daily. 30 capsule 5  . cycloSPORINE (RESTASIS) 0.05 % ophthalmic emulsion Place 1 drop into both eyes 2 (two) times daily.     . digoxin (LANOXIN) 0.125 MG tablet Take 1 tablet (0.125 mg total) by mouth daily. 30 tablet 3  . dorzolamide-timolol (COSOPT) 22.3-6.8 MG/ML ophthalmic solution Place 1 drop into the right eye 2 (two) times daily.     Marland Kitchen estrogens, conjugated, (PREMARIN) 0.625 MG tablet Take 0.625 mg by mouth daily.     . Magnesium 400 MG CAPS Take 400 mg by mouth 2 (two) times daily.    . metoprolol succinate (TOPROL-XL) 25 MG 24 hr tablet Take 0.5 tablets (12.5 mg total) by mouth daily. 45 tablet 3  . Multiple Vitamin (MULTIVITAMIN) tablet Take 1 tablet by mouth daily.    . Omega-3 Fatty Acids (FISH OIL) 1200 MG CAPS Take 1,200 mg by mouth 2 (two) times daily.    Marland Kitchen omeprazole (PRILOSEC) 40 MG capsule Take 40 mg by mouth daily.     No current facility-administered medications on file prior to visit.     Past Medical History  Diagnosis Date  . Arthritis   . Depression   . Hemorrhoids   . History of migraine headaches   . Glaucoma     left eye  .  Hypercholesteremia   . Hiatal hernia   . Kidney calculi   . Osteopenia   . Atrial fibrillation 10/01/14     Past Surgical History  Procedure Laterality Date  . Replacement total knee      bilateral  . Shoulder surgery      right shoulder  . Abdominal hysterectomy    . Foot surgery      bilateral   . Nasal septum surgery    . Colonoscopy  12/29/09    Dr Dionne Milo  . Eye surgery Left     shunt placed  . Hemorrhoid surgery       Removed Growth  . Augmentation mammaplasty Bilateral 2004     History reviewed. No pertinent family history.   Social History   Social History  . Marital Status: Divorced    Spouse Name: N/A  . Number of Children: N/A  . Years of Education: N/A   Occupational History  . Not on file.   Social History Main Topics  . Smoking status: Former Smoker -- 1.00 packs/day for 30 years    Types: Cigarettes  . Smokeless tobacco: Not on file  . Alcohol Use: No  . Drug Use: No  . Sexual Activity: Not on file   Other Topics Concern  . Not on file   Social History Narrative     ROS A 10 point review of system was performed. It is negative other than that mentioned in the history of present illness.     PHYSICAL EXAM   BP 128/78 mmHg  Pulse 67  Ht 5\' 8"  (1.727 m)  Wt 189 lb (85.73 kg)  BMI 28.74 kg/m2 Constitutional: She is oriented to person, place, and time. She appears well-developed and well-nourished. No distress.  HENT: No nasal discharge.  Head: Normocephalic and atraumatic.  Eyes: Pupils are equal and round. Right eye exhibits no discharge. Left eye exhibits no discharge.  Neck: Normal range of motion. Neck supple. No JVD present. No thyromegaly present.  Cardiovascular: Normal rate, irregular rhythm, normal heart sounds. Exam reveals no gallop and no friction rub. No murmur heard.  Pulmonary/Chest: Effort normal and breath sounds normal. No stridor. No respiratory distress. She has no wheezes. She has no rales. She exhibits no tenderness.  Abdominal: Soft. Bowel sounds are normal. She exhibits no distension. There is no tenderness. There is no rebound and no guarding.  Musculoskeletal: Normal range of motion. She exhibits no edema and no tenderness.  Neurological: She is alert and oriented to person, place, and time. Coordination normal.  Skin: Skin is warm and dry. No rash noted. She is not  diaphoretic. No erythema. No pallor.  Psychiatric: She has a normal mood and affect. Her behavior is normal. Judgment and thought content normal.     [id number] fibrillation  - Nonspecific T-abnormality.  Low voltage -possible pulmonary disease.   ABNORMAL     ASSESSMENT AND PLAN

## 2016-02-16 ENCOUNTER — Encounter
Admission: RE | Disposition: A | Discharge status: Home / Self Care | Payer: Self-pay | Source: Ambulatory Visit | Attending: Orthopedic Surgery

## 2016-02-16 ENCOUNTER — Ambulatory Visit: Payer: PPO | Admitting: Anesthesiology

## 2016-02-16 ENCOUNTER — Ambulatory Visit
Admission: RE | Admit: 2016-02-16 | Discharge: 2016-02-16 | Disposition: A | Discharge status: Home / Self Care | Payer: PPO | Source: Ambulatory Visit | Attending: Orthopedic Surgery | Admitting: Orthopedic Surgery

## 2016-02-16 ENCOUNTER — Encounter: Payer: Self-pay | Admitting: *Deleted

## 2016-02-16 DIAGNOSIS — Z87442 Personal history of urinary calculi: Secondary | ICD-10-CM | POA: Diagnosis not present

## 2016-02-16 DIAGNOSIS — Z90710 Acquired absence of both cervix and uterus: Secondary | ICD-10-CM | POA: Insufficient documentation

## 2016-02-16 DIAGNOSIS — Z885 Allergy status to narcotic agent status: Secondary | ICD-10-CM | POA: Diagnosis not present

## 2016-02-16 DIAGNOSIS — Z7901 Long term (current) use of anticoagulants: Secondary | ICD-10-CM | POA: Diagnosis not present

## 2016-02-16 DIAGNOSIS — G5603 Carpal tunnel syndrome, bilateral upper limbs: Secondary | ICD-10-CM | POA: Diagnosis not present

## 2016-02-16 DIAGNOSIS — Z881 Allergy status to other antibiotic agents status: Secondary | ICD-10-CM | POA: Diagnosis not present

## 2016-02-16 DIAGNOSIS — Z96653 Presence of artificial knee joint, bilateral: Secondary | ICD-10-CM | POA: Insufficient documentation

## 2016-02-16 DIAGNOSIS — G43909 Migraine, unspecified, not intractable, without status migrainosus: Secondary | ICD-10-CM | POA: Diagnosis not present

## 2016-02-16 DIAGNOSIS — G5601 Carpal tunnel syndrome, right upper limb: Secondary | ICD-10-CM | POA: Diagnosis not present

## 2016-02-16 DIAGNOSIS — Z8489 Family history of other specified conditions: Secondary | ICD-10-CM | POA: Diagnosis not present

## 2016-02-16 DIAGNOSIS — F329 Major depressive disorder, single episode, unspecified: Secondary | ICD-10-CM | POA: Insufficient documentation

## 2016-02-16 DIAGNOSIS — Z79899 Other long term (current) drug therapy: Secondary | ICD-10-CM | POA: Diagnosis not present

## 2016-02-16 DIAGNOSIS — Z8262 Family history of osteoporosis: Secondary | ICD-10-CM | POA: Diagnosis not present

## 2016-02-16 DIAGNOSIS — Z888 Allergy status to other drugs, medicaments and biological substances status: Secondary | ICD-10-CM | POA: Diagnosis not present

## 2016-02-16 DIAGNOSIS — M1990 Unspecified osteoarthritis, unspecified site: Secondary | ICD-10-CM | POA: Insufficient documentation

## 2016-02-16 DIAGNOSIS — E7800 Pure hypercholesterolemia, unspecified: Secondary | ICD-10-CM | POA: Diagnosis not present

## 2016-02-16 DIAGNOSIS — Z87891 Personal history of nicotine dependence: Secondary | ICD-10-CM | POA: Diagnosis not present

## 2016-02-16 HISTORY — DX: Headache: R51

## 2016-02-16 HISTORY — DX: Personal history of Methicillin resistant Staphylococcus aureus infection: Z86.14

## 2016-02-16 HISTORY — PX: CARPAL TUNNEL RELEASE: SHX101

## 2016-02-16 HISTORY — DX: Other complications of anesthesia, initial encounter: T88.59XA

## 2016-02-16 HISTORY — DX: Adverse effect of unspecified anesthetic, initial encounter: T41.45XA

## 2016-02-16 HISTORY — DX: Other specified postprocedural states: R11.2

## 2016-02-16 HISTORY — DX: Cardiac arrhythmia, unspecified: I49.9

## 2016-02-16 HISTORY — DX: Other specified postprocedural states: Z98.890

## 2016-02-16 HISTORY — DX: Family history of other specified conditions: Z84.89

## 2016-02-16 HISTORY — DX: Headache, unspecified: R51.9

## 2016-02-16 SURGERY — CARPAL TUNNEL RELEASE
Anesthesia: Monitor Anesthesia Care | Laterality: Right

## 2016-02-16 MED ORDER — LIDOCAINE HCL (PF) 0.5 % IJ SOLN
INTRAMUSCULAR | Status: DC | PRN
  Administered 2016-02-16: 50 mL

## 2016-02-16 MED ORDER — FAMOTIDINE 20 MG PO TABS
ORAL_TABLET | ORAL | Status: AC
  Administered 2016-02-16: 20 mg
  Filled 2016-02-16: qty 1

## 2016-02-16 MED ORDER — ACETAMINOPHEN 10 MG/ML IV SOLN
INTRAVENOUS | Status: AC
  Filled 2016-02-16: qty 100

## 2016-02-16 MED ORDER — ONDANSETRON HCL 4 MG/2ML IJ SOLN
INTRAMUSCULAR | Status: DC | PRN
  Administered 2016-02-16 (×2): 4 mg via INTRAVENOUS

## 2016-02-16 MED ORDER — DEXAMETHASONE SODIUM PHOSPHATE 4 MG/ML IJ SOLN
INTRAMUSCULAR | Status: DC | PRN
  Administered 2016-02-16: 10 mg via INTRAVENOUS

## 2016-02-16 MED ORDER — ONDANSETRON HCL 4 MG/2ML IJ SOLN: 4.0000 mg | Freq: Once | INTRAMUSCULAR | Status: DC | PRN

## 2016-02-16 MED ORDER — PROPOFOL 500 MG/50ML IV EMUL
INTRAVENOUS | Status: DC | PRN
  Administered 2016-02-16: 50 ug/kg/min via INTRAVENOUS

## 2016-02-16 MED ORDER — LIDOCAINE HCL (PF) 0.5 % IJ SOLN
INTRAMUSCULAR | Status: AC
  Filled 2016-02-16: qty 50

## 2016-02-16 MED ORDER — NEOMYCIN-POLYMYXIN B GU 40-200000 IR SOLN
Status: DC | PRN
  Administered 2016-02-16: 2 mL

## 2016-02-16 MED ORDER — TRAMADOL HCL 50 MG PO TABS: 50.0000 mg | ORAL_TABLET | Freq: Four times a day (QID) | ORAL | Status: DC | PRN

## 2016-02-16 MED ORDER — CLINDAMYCIN PHOSPHATE 600 MG/50ML IV SOLN
INTRAVENOUS | Status: AC
  Administered 2016-02-16: 600 mg via INTRAVENOUS
  Filled 2016-02-16: qty 50

## 2016-02-16 MED ORDER — LACTATED RINGERS IV SOLN
INTRAVENOUS | Status: DC
  Administered 2016-02-16 (×2): via INTRAVENOUS

## 2016-02-16 MED ORDER — MIDAZOLAM HCL 5 MG/5ML IJ SOLN
INTRAMUSCULAR | Status: DC | PRN
  Administered 2016-02-16 (×2): 1 mg via INTRAVENOUS

## 2016-02-16 MED ORDER — CLINDAMYCIN PHOSPHATE 600 MG/50ML IV SOLN: 600.0000 mg | Freq: Once | INTRAVENOUS | Status: DC

## 2016-02-16 MED ORDER — FENTANYL CITRATE (PF) 100 MCG/2ML IJ SOLN: 25.0000 ug | INTRAMUSCULAR | Status: DC | PRN

## 2016-02-16 MED ORDER — BUPIVACAINE HCL (PF) 0.25 % IJ SOLN
INTRAMUSCULAR | Status: AC
  Filled 2016-02-16: qty 30

## 2016-02-16 MED ORDER — NEOMYCIN-POLYMYXIN B GU 40-200000 IR SOLN
Status: AC
  Filled 2016-02-16: qty 2

## 2016-02-16 MED ORDER — BUPIVACAINE HCL (PF) 0.25 % IJ SOLN
INTRAMUSCULAR | Status: DC | PRN
  Administered 2016-02-16: 10 mL

## 2016-02-16 SURGICAL SUPPLY — 29 items
BANDAGE ELASTIC 3 LF NS (GAUZE/BANDAGES/DRESSINGS) ×3 IMPLANT
BLADE SURG 15 STRL LF DISP TIS (BLADE) ×1 IMPLANT
BLADE SURG 15 STRL SS (BLADE) ×3
BNDG CMPR MED 5X3 ELC HKLP NS (GAUZE/BANDAGES/DRESSINGS) ×1
BNDG ESMARK 4X12 TAN STRL LF (GAUZE/BANDAGES/DRESSINGS) ×3 IMPLANT
CANISTER SUCT 1200ML W/VALVE (MISCELLANEOUS) ×3 IMPLANT
CAST PADDING 3X4FT ST 30246 (SOFTGOODS) ×2
COVER LIGHT HANDLE STERIS (MISCELLANEOUS) ×2 IMPLANT
DRSG DERMACEA 8X12 NADH (GAUZE/BANDAGES/DRESSINGS) ×3 IMPLANT
DURAPREP 26ML APPLICATOR (WOUND CARE) ×3 IMPLANT
ELECT CAUTERY BLADE 6.4 (BLADE) ×3 IMPLANT
ELECT REM PT RETURN 9FT ADLT (ELECTROSURGICAL) ×3
ELECTRODE REM PT RTRN 9FT ADLT (ELECTROSURGICAL) ×1 IMPLANT
GAUZE SPONGE 4X4 12PLY STRL (GAUZE/BANDAGES/DRESSINGS) ×3 IMPLANT
GLOVE BIOGEL M STRL SZ7.5 (GLOVE) ×3 IMPLANT
GLOVE INDICATOR 8.0 STRL GRN (GLOVE) ×3 IMPLANT
GOWN STRL REUS W/ TWL LRG LVL3 (GOWN DISPOSABLE) ×2 IMPLANT
GOWN STRL REUS W/TWL LRG LVL3 (GOWN DISPOSABLE) ×6
NS IRRIG 500ML POUR BTL (IV SOLUTION) ×3 IMPLANT
PACK EXTREMITY ARMC (MISCELLANEOUS) ×3 IMPLANT
PAD CAST CTTN 3X4 STRL (SOFTGOODS) ×1 IMPLANT
PADDING CAST COTTON 3X4 STRL (SOFTGOODS) ×1
SOL PREP PVP 2OZ (MISCELLANEOUS) ×3
SOLUTION PREP PVP 2OZ (MISCELLANEOUS) ×1 IMPLANT
SPLINT CAST 1 STEP 3X12 (MISCELLANEOUS) ×3 IMPLANT
STOCKINETTE 48X4 2 PLY STRL (GAUZE/BANDAGES/DRESSINGS) ×1 IMPLANT
STOCKINETTE STRL 4IN 9604848 (GAUZE/BANDAGES/DRESSINGS) ×3 IMPLANT
STRAP SAFETY BODY (MISCELLANEOUS) ×3 IMPLANT
SUT ETHILON 5-0 FS-2 18 BLK (SUTURE) ×3 IMPLANT

## 2016-02-16 NOTE — H&P (Signed)
The patient has been re-examined, and the chart reviewed, and there have been no interval changes to the documented history and physical.    The risks, benefits, and alternatives have been discussed at length. The patient expressed understanding of the risks benefits and agreed with plans for surgical intervention.  Emir Nack P. Bethzaida Boord, Jr. M.D.    

## 2016-02-16 NOTE — Anesthesia Preprocedure Evaluation (Signed)
Anesthesia Evaluation  Patient identified by MRN, date of birth, ID band Patient awake    Reviewed: Allergy & Precautions, H&P , NPO status , Patient's Chart, lab work & pertinent test results, reviewed documented beta blocker date and time   History of Anesthesia Complications (+) PONV, PROLONGED EMERGENCE, Family history of anesthesia reaction and history of anesthetic complications  Airway Mallampati: III  TM Distance: >3 FB Neck ROM: full    Dental no notable dental hx. (+) Upper Dentures, Lower Dentures   Pulmonary neg pulmonary ROS, former smoker,    Pulmonary exam normal breath sounds clear to auscultation       Cardiovascular Exercise Tolerance: Good hypertension, negative cardio ROS  + dysrhythmias Atrial Fibrillation  Rhythm:regular Rate:Normal     Neuro/Psych  Headaches,  Neuromuscular disease negative neurological ROS  negative psych ROS   GI/Hepatic negative GI ROS, Neg liver ROS, hiatal hernia, GERD  ,  Endo/Other  negative endocrine ROSdiabetes  Renal/GU Renal disease     Musculoskeletal   Abdominal   Peds  Hematology negative hematology ROS (+)   Anesthesia Other Findings   Reproductive/Obstetrics negative OB ROS                             Anesthesia Physical Anesthesia Plan  ASA: III  Anesthesia Plan: MAC and Bier Block   Post-op Pain Management:    Induction:   Airway Management Planned:   Additional Equipment:   Intra-op Plan:   Post-operative Plan:   Informed Consent: I have reviewed the patients History and Physical, chart, labs and discussed the procedure including the risks, benefits and alternatives for the proposed anesthesia with the patient or authorized representative who has indicated his/her understanding and acceptance.     Plan Discussed with: CRNA  Anesthesia Plan Comments:         Anesthesia Quick Evaluation

## 2016-02-16 NOTE — Transfer of Care (Signed)
Immediate Anesthesia Transfer of Care Note  Patient: Teresa Harris  Procedure(s) Performed: Procedure(s): CARPAL TUNNEL RELEASE (Right)  Patient Location: PACU  Anesthesia Type:Regional  Level of Consciousness: awake, oriented and sedated  Airway & Oxygen Therapy: Patient Spontanous Breathing and Patient connected to face mask oxygen  Post-op Assessment: Report given to RN and Post -op Vital signs reviewed and stable  Post vital signs: Reviewed and stable  Last Vitals:  Filed Vitals:   02/16/16 0951  BP: 160/87  Pulse: 64  Temp: 36.4 C  Resp: 16    Complications: No apparent anesthesia complications

## 2016-02-16 NOTE — Op Note (Signed)
OPERATIVE NOTE  DATE OF SURGERY:  02/16/2016  PATIENT NAME:  Tyara Corwin Twaddle   DOB: 05/14/1941  MRN: [id number]  PRE-OPERATIVE DIAGNOSIS: Right carpal tunnel syndrome  POST-OPERATIVE DIAGNOSIS:  Same  PROCEDURE:  Right carpal tunnel release  SURGEON:  Marciano Sequin. M.D.  ANESTHESIA: Bier block  ESTIMATED BLOOD LOSS: Minimal  FLUIDS REPLACED: 700 mL of crystalloid  TOURNIQUET TIME: 63 minutes  DRAINS: None  INDICATIONS FOR SURGERY: HATSUYO NISSIM is a 75 y.o. year old female with a long history of numbness and paresthesias to both hands, with the right more symptomatic than the left. EMG/nerve conduction studies demonstrated findings consistent with carpal tunnel syndrome.The patient had not seen any significant improvement despite conservative nonsurgical intervention. After discussion of the risks and benefits of surgical intervention, the patient expressed understanding of the risks benefits and agree with plans for carpal tunnel release.   PROCEDURE IN DETAIL: The patient was brought into the operating room and after adequate Bier block anesthesia the right hand and arm were prepped with alcohol and Duraprep and draped in the usual sterile fashion. A "time-out" was performed as per usual protocol. Loupe magnification was used throughout the procedure. An incision was made just ulnar to the thenar palmar crease. Dissection was carried down through the palmar fascia to the transverse carpal ligament. The transverse carpal ligament was sharply incised, taking care to protect the underlying structures with the carpal tunnel. Complete release of the transverse carpal ligament was achieved. There was no evidence of ganglion cyst or lipoma within the carpal tunnel. The wound was irrigated with copious amounts of normal saline with antibiotic solution. The skin was then re-approximated with interrupted sutures of #5-0 nylon. A sterile dressing was applied followed by application of a  volar splint. The tourniquet was deflated with a total tourniquet time of 63 minutes minutes.  The patient tolerated the procedure well and was transported to the PACU in stable condition.  Huda Petrey P. Holley Bouche., M.D.

## 2016-02-16 NOTE — Discharge Instructions (Signed)
°  Instructions after Hand / Wrist Surgery ° ° James P. Hooten, Jr., M.D. ° Dept. of Orthopaedics & Sports Medicine ° [facility] ° [address] ° [place], [place]  [address] ° ° Phone: [phone]   Fax: [phone] ° ° °DIET: °• Drink plenty of non-alcoholic fluids & begin a light diet. °• Resume your normal diet the day after surgery. ° °ACTIVITY:  °• Keep the hand elevated above the level of the elbow. °• Begin gently moving the fingers on a regular basis to avoid stiffness. °• Avoid any heavy lifting, pushing, or pulling with the operative hand. °• Do not drive or operate any equipment until instructed. ° °WOUND CARE:  °• Keep the splint/bandage clean and dry.  °• The splint and stitches will be removed in the office. °• Continue to use the ice packs periodically to reduce pain and swelling. °• You may bathe or shower after the stitches are removed at the first office visit following surgery. ° °MEDICATIONS: °• You may resume your regular medications. °• Please take the pain medication as prescribed. °• Do not take pain medication on an empty stomach. °• Do not drive or drink alcoholic beverages when taking pain medications. ° °CALL THE OFFICE FOR: °• Temperature above 101 degrees °• Excessive bleeding or drainage on the dressing. °• Excessive swelling, coldness, or paleness of the fingers. °• Persistent nausea and vomiting. ° °FOLLOW-UP:  °• You should have an appointment to return to the office in 7-10 days after surgery.  ° °REMEMBER: R.I.C.E. = Rest, Ice, Compression, Elevation !  ° ° ° °AMBULATORY SURGERY  °DISCHARGE INSTRUCTIONS ° ° °1) The drugs that you were given will stay in your system until tomorrow so for the next 24 hours you should not: ° °A) Drive an automobile °B) Make any legal decisions °C) Drink any alcoholic beverage ° ° °2) You may resume regular meals tomorrow.  Today it is better to start with liquids and gradually work up to solid foods. ° °You may eat anything you prefer, but  it is better to start with liquids, then soup and crackers, and gradually work up to solid foods. ° ° °3) Please notify your doctor immediately if you have any unusual bleeding, trouble breathing, redness and pain at the surgery site, drainage, fever, or pain not relieved by medication. ° ° ° °4) Additional Instructions: ° ° ° ° ° ° ° °Please contact your physician with any problems or Same Day Surgery at [phone], Monday through Friday 6 am to 4 pm, or [place] [facility] [place] [facility] number at [phone]. °

## 2016-02-16 NOTE — Brief Op Note (Signed)
02/16/2016  1:01 PM  PATIENT:  Teresa Harris  75 y.o. female  PRE-OPERATIVE DIAGNOSIS:  RIGHT CARPAL TUNNEL SYNDROME  POST-OPERATIVE DIAGNOSIS:  Right carpal tunnel syndrome  PROCEDURE:  Procedure(s): CARPAL TUNNEL RELEASE (Right)  SURGEON:  Surgeon(s) and Role:    * Dereck Leep, MD - Primary  ASSISTANTS: none   ANESTHESIA:   Bier block  EBL:     BLOOD ADMINISTERED:none  DRAINS: none   LOCAL MEDICATIONS USED:  MARCAINE     SPECIMEN:  No Specimen  DISPOSITION OF SPECIMEN:  N/A  COUNTS:  YES  TOURNIQUET:   63 minutes  DICTATION: .Dragon Dictation  PLAN OF CARE: Discharge to home after PACU  PATIENT DISPOSITION:  PACU - hemodynamically stable.   Delay start of Pharmacological VTE agent (>24hrs) due to surgical blood loss or risk of bleeding: not applicable

## 2016-02-17 NOTE — Anesthesia Postprocedure Evaluation (Signed)
Anesthesia Post Note  Patient: Teresa Harris  Procedure(s) Performed: Procedure(s) (LRB): CARPAL TUNNEL RELEASE (Right)  Patient location during evaluation: PACU Anesthesia Type: General Level of consciousness: awake and alert Pain management: pain level controlled Vital Signs Assessment: post-procedure vital signs reviewed and stable Respiratory status: spontaneous breathing, nonlabored ventilation, respiratory function stable and patient connected to nasal cannula oxygen Cardiovascular status: blood pressure returned to baseline and stable Postop Assessment: no signs of nausea or vomiting Anesthetic complications: no    Last Vitals:  Filed Vitals:   02/16/16 1412 02/16/16 1415  BP: 137/86 127/84  Pulse: 45   Temp:    Resp: 20 20    Last Pain: There were no vitals filed for this visit.               Molli Barrows

## 2016-03-19 ENCOUNTER — Other Ambulatory Visit: Payer: Self-pay | Admitting: *Deleted

## 2016-03-19 DIAGNOSIS — J069 Acute upper respiratory infection, unspecified: Secondary | ICD-10-CM | POA: Diagnosis not present

## 2016-03-19 MED ORDER — DIGOXIN 125 MCG PO TABS: 0.1250 mg | ORAL_TABLET | ORAL | Status: DC

## 2016-03-22 DIAGNOSIS — J309 Allergic rhinitis, unspecified: Secondary | ICD-10-CM | POA: Diagnosis not present

## 2016-03-22 DIAGNOSIS — J019 Acute sinusitis, unspecified: Secondary | ICD-10-CM | POA: Diagnosis not present

## 2016-03-23 ENCOUNTER — Ambulatory Visit: Payer: PPO | Admitting: Cardiovascular Disease

## 2016-03-30 ENCOUNTER — Other Ambulatory Visit: Payer: PPO

## 2016-04-06 ENCOUNTER — Telehealth: Payer: Self-pay | Admitting: Cardiovascular Disease

## 2016-04-06 ENCOUNTER — Encounter: Payer: Self-pay | Admitting: *Deleted

## 2016-04-06 ENCOUNTER — Other Ambulatory Visit: Payer: PPO

## 2016-04-06 NOTE — Telephone Encounter (Signed)
Received fax from PAT requesting instructions on when pt should hold Eliquis for 5/8 carpal tunnel release. Request placed in MD basket

## 2016-04-06 NOTE — Pre-Procedure Instructions (Signed)
SEEING DR ARIDA 04/08/16 AND FAXED REQUEST TO HIS OFFICE  TO INSTRUCT PATIENT WHEN TO STOP ELIQUIS FOR SURGERY 04/12/16

## 2016-04-06 NOTE — Patient Instructions (Addendum)
  Your procedure is scheduled on: 04/12/16 Report to Day Surgery. [facility]OND FLOOR To find out your arrival time please call [phone] between 1PM - 3PM on 04/09/16 Remember: Instructions that are not followed completely may result in serious medical risk, up to and including death, or upon the discretion of your surgeon and anesthesiologist your surgery may need to be rescheduled.    __X__ 1. Do not eat food or drink liquids after midnight. No gum chewing or hard candies.     _X___ 2. No Alcohol for 24 hours before or after surgery.   ____ 3. Bring all medications with you on the day of surgery if instructed.    __X__ 4. Notify your doctor if there is any change in your medical condition     (cold, fever, infections).     Do not wear jewelry, make-up, hairpins, clips or nail polish.  Do not wear lotions, powders, or perfumes. You may wear deodorant.  Do not shave 48 hours prior to surgery. Men may shave face and neck.  Do not bring valuables to the hospital.    [facility] is not responsible for any belongings or valuables.               Contacts, dentures or bridgework may not be worn into surgery.  Leave your suitcase in the car. After surgery it may be brought to your room.  For patients admitted to the hospital, discharge time is determined by your                treatment team.   Patients discharged the day of surgery will not be allowed to drive home.   Please read over the following fact sheets that you were given:   Surgical Site Infection Prevention   _X___ Take these medicines the morning of surgery with A SIP OF WATER:    1. DIGOXIN  2. MAGNESIUM  3. OMEPRAZOLE AT BEDTIME 04/11/16 AND AM SURGERY  4.  5.  6.  ____ Fleet Enema (as directed)   __X__ Use CHG Soap as directed   HAS FROM PREVIOUS SURGERY  ____ Use inhalers on the day of surgery  ____ Stop metformin 2 days prior to surgery    ____ Take 1/2 of usual insulin dose the night before surgery and  none on the morning of surgery.   ___X_ Stop Coumadin/Plavix/aspirin on    STOP ELIQUIS AS INSTRUCTED BY DR ARIDA  __X__ Stop Anti-inflammatories on   STOP FISH OIL NOW   ____ Stop supplements until after surgery.    ____ Bring C-Pap to the hospital.

## 2016-04-08 ENCOUNTER — Ambulatory Visit (INDEPENDENT_AMBULATORY_CARE_PROVIDER_SITE_OTHER): Payer: PPO | Admitting: Cardiovascular Disease

## 2016-04-08 ENCOUNTER — Encounter
Admission: RE | Admit: 2016-04-08 | Discharge: 2016-04-08 | Disposition: A | Discharge status: Home / Self Care | Payer: PPO | Source: Ambulatory Visit | Attending: Orthopedic Surgery | Admitting: Orthopedic Surgery

## 2016-04-08 ENCOUNTER — Encounter: Payer: Self-pay | Admitting: Cardiovascular Disease

## 2016-04-08 VITALS — BP 120/70 | HR 83 | Ht 68.0 in | Wt 190.5 lb

## 2016-04-08 DIAGNOSIS — R002 Palpitations: Secondary | ICD-10-CM | POA: Diagnosis not present

## 2016-04-08 DIAGNOSIS — Z01812 Encounter for preprocedural laboratory examination: Secondary | ICD-10-CM | POA: Diagnosis not present

## 2016-04-08 DIAGNOSIS — I10 Essential (primary) hypertension: Secondary | ICD-10-CM

## 2016-04-08 DIAGNOSIS — I482 Chronic atrial fibrillation, unspecified: Secondary | ICD-10-CM

## 2016-04-08 LAB — BASIC METABOLIC PANEL
Anion gap: 9 (ref 5–15)
BUN: 14 mg/dL (ref 6–20)
CO2: 22 mmol/L (ref 22–32)
Calcium: 9.4 mg/dL (ref 8.9–10.3)
Chloride: 105 mmol/L (ref 101–111)
Creatinine, Ser: 0.72 mg/dL (ref 0.44–1.00)
GFR calc Af Amer: >60 mL/min (ref >60)
GFR calc non Af Amer: >60 mL/min (ref >60)
Glucose, Bld: 157 mg/dL — ABNORMAL HIGH (ref 65–99)
Potassium: 4 mmol/L (ref 3.5–5.1)
Sodium: 136 mmol/L (ref 135–145)

## 2016-04-08 LAB — CBC
HCT: 41.8 % (ref 35.0–47.0)
Hemoglobin: 14 g/dL (ref 12.0–16.0)
MCH: 32.2 pg (ref 26.0–34.0)
MCHC: 33.5 g/dL (ref 32.0–36.0)
MCV: 96.2 fL (ref 80.0–100.0)
Platelets: 188 10*3/uL (ref 150–440)
RBC: 4.35 MIL/uL (ref 3.80–5.20)
RDW: 13.5 % (ref 11.5–14.5)
WBC: 6.3 10*3/uL (ref 3.6–11.0)

## 2016-04-08 NOTE — Progress Notes (Signed)
Cardiology Office Note   Date:  04/08/2016   ID:  Teresa Harris, DOB [birth year], MRN [id number]  PCP:  Margarita Rana, MD  Cardiologist:   Kathlyn Sacramento, MD   Chief Complaint  Patient presents with  . other    6 month follow up. Meds reviewed by the patient verbally. "doing well."       History of Present Illness: Teresa Harris is a 75 y.o. female who presents for a follow-up regarding chronic atrial fibrillation.  She has borderline diabetes and hyperlipidemia not requiring medications. She smoked in the past more than 30 years ago. There is no family history of coronary artery disease or arrhythmia.  She was diagnosed with symptomatic atrial fibrillation in 2015.  She underwent a nuclear stress test in November 2015 which showed no evidence of ischemia with normal ejection fraction. Echocardiogram showed normal EF with mild to moderate mitral regurgitation and mildly dilated left atrium. She complained of fatigue with metoprolol and was switched to diltiazem but that was not effective. She went back on Toprol. She is doing reasonably well with no chest pain or shortness of breath. No bleeding complications with anticoagulation.   Past Medical History  Diagnosis Date  . Arthritis   . Hemorrhoids   . History of migraine headaches   . Glaucoma     left eye  . Hypercholesteremia   . Hiatal hernia   . Kidney calculi   . Osteopenia   . Atrial fibrillation ([place]) 10/01/14  . GERD (gastroesophageal reflux disease)   . Headache     H/O MIGRAINES  . Family history of adverse reaction to anesthesia   . Hx MRSA infection   . Complication of anesthesia     trouble waking up with Propofol  . PONV (postoperative nausea and vomiting)   . Depression     h/o  . Dysrhythmia     a fib    Past Surgical History  Procedure Laterality Date  . Replacement total knee      bilateral  . Shoulder surgery      right shoulder  . Abdominal hysterectomy    . Foot surgery     bilateral   . Nasal septum surgery    . Colonoscopy  12/29/09    Dr Dionne Milo  . Eye surgery Left     shunt placed  . Augmentation mammaplasty Bilateral 2004  . Joint replacement    . Carpal tunnel release Right 02/16/2016    Procedure: CARPAL TUNNEL RELEASE;  Surgeon: Dereck Leep, MD;  Location: [facility] ORS;  Service: Orthopedics;  Laterality: Right;     Current Outpatient Prescriptions  Medication Sig Dispense Refill  . calcium carbonate (OS-CAL) 600 MG TABS Take 600 mg by mouth daily with breakfast.     . celecoxib (CELEBREX) 200 MG capsule TAKE 1 CAPSULE BY MOUTH ONCE DAILY. 30 capsule 6  . cycloSPORINE (RESTASIS) 0.05 % ophthalmic emulsion Place 1 drop into both eyes 2 (two) times daily.     . digoxin (LANOXIN) 0.125 MG tablet Take 1 tablet (0.125 mg total) by mouth every morning. 30 tablet 3  . dorzolamide-timolol (COSOPT) 22.3-6.8 MG/ML ophthalmic solution Place 1 drop into the right eye 2 (two) times daily.     Marland Kitchen ELIQUIS 5 MG TABS tablet TAKE 1 TABLET BY MOUTH TWICE DAILY. 60 tablet 6  . estrogens, conjugated, (PREMARIN) 0.625 MG tablet Take 0.625 mg by mouth daily.     . Magnesium 400 MG CAPS Take 400  mg by mouth 2 (two) times daily.    . metoprolol succinate (TOPROL-XL) 25 MG 24 hr tablet Take 0.5 tablets (12.5 mg total) by mouth daily. (Patient taking differently: Take 12.5 mg by mouth at bedtime. ) 30 tablet 6  . Multiple Vitamin (MULTIVITAMIN) tablet Take 1 tablet by mouth daily.    . Omega-3 Fatty Acids (FISH OIL) 1200 MG CAPS Take 1,200 mg by mouth 2 (two) times daily.    Marland Kitchen omeprazole (PRILOSEC) 40 MG capsule Take 40 mg by mouth every morning.     . traMADol (ULTRAM) 50 MG tablet Take 1 tablet (50 mg total) by mouth every 6 (six) hours as needed for moderate pain. 30 tablet 0   No current facility-administered medications for this visit.    Allergies:   [name]; Bimatoprost; Brimonidine; Keflex; Mobic; Moxifloxacin; Propofol; Travoprost; and Vicodin    Social History:   The patient  reports that she quit smoking about 20 years ago. Her smoking use included Cigarettes. She has a 30 pack-year smoking history. She has never used smokeless tobacco. She reports that she does not drink alcohol or use illicit drugs.   Family History:  The patient's family history includes Arthritis in her father and mother.    ROS:  Please see the history of present illness.   Otherwise, review of systems are positive for none.   All other systems are reviewed and negative.    PHYSICAL EXAM: VS:  Ht 5\' 8"  (1.727 m)  Wt 190 lb 8 oz (86.41 kg)  BMI 28.97 kg/m2 , BMI Body mass index is 28.97 kg/(m^2). GEN: Well nourished, well developed, in no acute distress HEENT: normal Neck: no JVD, carotid bruits, or masses Cardiac: Irregularly irregular ; no murmurs, rubs, or gallops,no edema  Respiratory:  clear to auscultation bilaterally, normal work of breathing GI: soft, nontender, nondistended, + BS MS: no deformity or atrophy Skin: warm and dry, no rash Neuro:  Strength and sensation are intact Psych: euthymic mood, full affect   EKG:  EKG is ordered today. The ekg ordered today demonstrates atrial fibrillation with low voltage and nonspecific ST changes.    Recent Labs: 11/27/2015: ALT 23; BUN 13; Creatinine, Ser 0.66; Platelets 199; Potassium 4.9; Sodium 140; TSH 1.050    Lipid Panel    Component Value Date/Time   CHOL 194 11/27/2015 1021   CHOL 201* 11/18/2014   TRIG 240* 11/27/2015 1021   HDL 48 11/27/2015 1021   HDL 55 11/18/2014   CHOLHDL 4.0 11/27/2015 1021   LDLCALC 98 11/27/2015 1021   LDLCALC 98 11/18/2014      Wt Readings from Last 3 Encounters:  04/08/16 190 lb 8 oz (86.41 kg)  04/06/16 189 lb (85.73 kg)  02/16/16 186 lb (84.369 kg)       ASSESSMENT AND PLAN:  Chronic atrial fibrillation: Ventricular rate is reasonably controlled with small dose digoxin and metoprolol. She is tolerating anticoagulation with no side effects. She had labs done  today and are pending.    Disposition:   FU with me in 6 months  Signed,  Kathlyn Sacramento, MD  04/08/2016 3:02 PM    [facility]

## 2016-04-08 NOTE — Telephone Encounter (Signed)
Faxed instructions regarding Eliquis to PAT, [phone] Left message on pt home VM to hold Eliquis x 2 days prior to surgery.

## 2016-04-08 NOTE — Patient Instructions (Signed)
Medication Instructions: Continue same medications.   Labwork: None.   Procedures/Testing: None.   Follow-Up: 6 months with Dr. Ernie Kasler.   Any Additional Special Instructions Will Be Listed Below (If Applicable).     If you need a refill on your cardiac medications before your next appointment, please call your pharmacy.   

## 2016-04-09 NOTE — Pre-Procedure Instructions (Signed)
PT TO STOP ELOQUIS 2 DAYS PRIOR TO SURGERY PER DR ARIDA-NOTE ON CHART-LAST DOSE ON Friday 04-09-16-VERBALLY INSTRUCTED PT ON THIS AND ALSO GAVE HER WRITTEN INSTRUCTIONS-PT VERBALIZED UNDERSTANDING

## 2016-04-09 NOTE — Pre-Procedure Instructions (Signed)
Progress Notes   KEEGAN ALMASRI (MR# [id number])      Progress Notes Info    Author Note Status Last Update User Last Update Date/Time   Wellington Hampshire, MD Signed Wellington Hampshire, MD 04/08/2016 4:42 PM    Progress Notes    Expand All Collapse All      Cardiology Office Note   Date: 04/08/2016   ID: Teresa Harris, DOB March 13, 1941, MRN [id number]  PCP: Margarita Rana, MD Cardiologist: Kathlyn Sacramento, MD   Chief Complaint  Patient presents with  . other    6 month follow up. Meds reviewed by the patient verbally. "doing well."      History of Present Illness: Teresa Harris is a 75 y.o. female who presents for a follow-up regarding chronic atrial fibrillation. She has borderline diabetes and hyperlipidemia not requiring medications. She smoked in the past more than 30 years ago. There is no family history of coronary artery disease or arrhythmia.  She was diagnosed with symptomatic atrial fibrillation in 2015.  She underwent a nuclear stress test in November 2015 which showed no evidence of ischemia with normal ejection fraction. Echocardiogram showed normal EF with mild to moderate mitral regurgitation and mildly dilated left atrium. She complained of fatigue with metoprolol and was switched to diltiazem but that was not effective. She went back on Toprol. She is doing reasonably well with no chest pain or shortness of breath. No bleeding complications with anticoagulation.   Past Medical History  Diagnosis Date  . Arthritis   . Hemorrhoids   . History of migraine headaches   . Glaucoma     left eye  . Hypercholesteremia   . Hiatal hernia   . Kidney calculi   . Osteopenia   . Atrial fibrillation ([place]) 10/01/14  . GERD (gastroesophageal reflux disease)   . Headache     H/O MIGRAINES  . Family history of adverse reaction to anesthesia   . Hx MRSA infection   . Complication of  anesthesia     trouble waking up with Propofol  . PONV (postoperative nausea and vomiting)   . Depression     h/o  . Dysrhythmia     a fib    Past Surgical History  Procedure Laterality Date  . Replacement total knee      bilateral  . Shoulder surgery      right shoulder  . Abdominal hysterectomy    . Foot surgery      bilateral   . Nasal septum surgery    . Colonoscopy  12/29/09    Dr Dionne Milo  . Eye surgery Left     shunt placed  . Augmentation mammaplasty Bilateral 2004  . Joint replacement    . Carpal tunnel release Right 02/16/2016    Procedure: CARPAL TUNNEL RELEASE; Surgeon: Dereck Leep, MD; Location: [facility] ORS; Service: Orthopedics; Laterality: Right;     Current Outpatient Prescriptions  Medication Sig Dispense Refill  . calcium carbonate (OS-CAL) 600 MG TABS Take 600 mg by mouth daily with breakfast.     . celecoxib (CELEBREX) 200 MG capsule TAKE 1 CAPSULE BY MOUTH ONCE DAILY. 30 capsule 6  . cycloSPORINE (RESTASIS) 0.05 % ophthalmic emulsion Place 1 drop into both eyes 2 (two) times daily.     . digoxin (LANOXIN) 0.125 MG tablet Take 1 tablet (0.125 mg total) by mouth every morning. 30 tablet 3  . dorzolamide-timolol (COSOPT) 22.3-6.8 MG/ML ophthalmic solution Place 1 drop into the right eye 2 (  two) times daily.     Marland Kitchen ELIQUIS 5 MG TABS tablet TAKE 1 TABLET BY MOUTH TWICE DAILY. 60 tablet 6  . estrogens, conjugated, (PREMARIN) 0.625 MG tablet Take 0.625 mg by mouth daily.     . Magnesium 400 MG CAPS Take 400 mg by mouth 2 (two) times daily.    . metoprolol succinate (TOPROL-XL) 25 MG 24 hr tablet Take 0.5 tablets (12.5 mg total) by mouth daily. (Patient taking differently: Take 12.5 mg by mouth at bedtime. ) 30 tablet 6  . Multiple Vitamin (MULTIVITAMIN) tablet Take 1 tablet by mouth daily.    . Omega-3 Fatty Acids (FISH  OIL) 1200 MG CAPS Take 1,200 mg by mouth 2 (two) times daily.    Marland Kitchen omeprazole (PRILOSEC) 40 MG capsule Take 40 mg by mouth every morning.     . traMADol (ULTRAM) 50 MG tablet Take 1 tablet (50 mg total) by mouth every 6 (six) hours as needed for moderate pain. 30 tablet 0   No current facility-administered medications for this visit.    Allergies: [name]; Bimatoprost; Brimonidine; Keflex; Mobic; Moxifloxacin; Propofol; Travoprost; and Vicodin    Social History: The patient  reports that she quit smoking about 20 years ago. Her smoking use included Cigarettes. She has a 30 pack-year smoking history. She has never used smokeless tobacco. She reports that she does not drink alcohol or use illicit drugs.   Family History: The patient's family history includes Arthritis in her father and mother.    ROS: Please see the history of present illness. Otherwise, review of systems are positive for none. All other systems are reviewed and negative.    PHYSICAL EXAM: VS: Ht 5\' 8"  (1.727 m)  Wt 190 lb 8 oz (86.41 kg)  BMI 28.97 kg/m2 , BMI Body mass index is 28.97 kg/(m^2). GEN: Well nourished, well developed, in no acute distress  HEENT: normal  Neck: no JVD, carotid bruits, or masses Cardiac: Irregularly irregular ; no murmurs, rubs, or gallops,no edema  Respiratory: clear to auscultation bilaterally, normal work of breathing GI: soft, nontender, nondistended, + BS MS: no deformity or atrophy  Skin: warm and dry, no rash Neuro: Strength and sensation are intact Psych: euthymic mood, full affect   EKG: EKG is ordered today. The ekg ordered today demonstrates atrial fibrillation with low voltage and nonspecific ST changes.    Recent Labs: 11/27/2015: ALT 23; BUN 13; Creatinine, Ser 0.66; Platelets 199; Potassium 4.9; Sodium 140; TSH 1.050    Lipid Panel  Labs (Brief)       Component Value Date/Time   CHOL 194 11/27/2015 1021   CHOL 201*  11/18/2014   TRIG 240* 11/27/2015 1021   HDL 48 11/27/2015 1021   HDL 55 11/18/2014   CHOLHDL 4.0 11/27/2015 1021   LDLCALC 98 11/27/2015 1021   LDLCALC 98 11/18/2014       Wt Readings from Last 3 Encounters:  04/08/16 190 lb 8 oz (86.41 kg)  04/06/16 189 lb (85.73 kg)  02/16/16 186 lb (84.369 kg)       ASSESSMENT AND PLAN:  Chronic atrial fibrillation: Ventricular rate is reasonably controlled with small dose digoxin and metoprolol. She is tolerating anticoagulation with no side effects. She had labs done today and are pending.    Disposition: FU with me in 6 months  Signed,  Kathlyn Sacramento, MD  04/08/2016 3:02 PM  [facility]

## 2016-04-10 ENCOUNTER — Other Ambulatory Visit: Payer: Self-pay | Admitting: Family Medicine

## 2016-04-10 DIAGNOSIS — N951 Menopausal and female climacteric states: Secondary | ICD-10-CM

## 2016-04-11 NOTE — Discharge Instructions (Signed)
°  Instructions after Hand / Wrist Surgery   Hatim Homann P. Holley Bouche., M.D.  Dept. of [facility]  [address] [place] [address] [place], [place]  [address]   Phone: [phone]   Fax: [phone]   DIET:  Drink plenty of non-alcoholic fluids & begin a light diet.  Resume your normal diet the day after surgery.  ACTIVITY:   Keep the hand elevated above the level of the elbow.  Begin gently moving the fingers on a regular basis to avoid stiffness.  Avoid any heavy lifting, pushing, or pulling with the operative hand.  Do not drive or operate any equipment until instructed.  WOUND CARE:   Keep the splint/bandage clean and dry.   The splint and stitches will be removed in the office.  Continue to use the ice packs periodically to reduce pain and swelling.  You may bathe or shower after the stitches are removed at the first office visit following surgery.  MEDICATIONS:  You may resume your regular medications.  Please take the pain medication as prescribed.  Do not take pain medication on an empty stomach.  Do not drive or drink alcoholic beverages when taking pain medications.  CALL THE OFFICE FOR:  Temperature above 101 degrees  Excessive bleeding or drainage on the dressing.  Excessive swelling, coldness, or paleness of the fingers.  Persistent nausea and vomiting.  FOLLOW-UP:   You should have an appointment to return to the office in 7-10 days after surgery.   REMEMBER: R.I.C.E. = Rest, Ice, Compression, Elevation !

## 2016-04-12 ENCOUNTER — Ambulatory Visit: Payer: PPO | Admitting: Anesthesiology

## 2016-04-12 ENCOUNTER — Ambulatory Visit
Admission: RE | Admit: 2016-04-12 | Discharge: 2016-04-12 | Disposition: A | Discharge status: Home / Self Care | Payer: PPO | Source: Ambulatory Visit | Attending: Orthopedic Surgery | Admitting: Orthopedic Surgery

## 2016-04-12 ENCOUNTER — Encounter
Admission: RE | Disposition: A | Discharge status: Home / Self Care | Payer: Self-pay | Source: Ambulatory Visit | Attending: Orthopedic Surgery

## 2016-04-12 ENCOUNTER — Encounter: Payer: Self-pay | Admitting: *Deleted

## 2016-04-12 DIAGNOSIS — Z96653 Presence of artificial knee joint, bilateral: Secondary | ICD-10-CM | POA: Diagnosis not present

## 2016-04-12 DIAGNOSIS — Z881 Allergy status to other antibiotic agents status: Secondary | ICD-10-CM | POA: Insufficient documentation

## 2016-04-12 DIAGNOSIS — Z87891 Personal history of nicotine dependence: Secondary | ICD-10-CM | POA: Diagnosis not present

## 2016-04-12 DIAGNOSIS — F329 Major depressive disorder, single episode, unspecified: Secondary | ICD-10-CM | POA: Insufficient documentation

## 2016-04-12 DIAGNOSIS — Z888 Allergy status to other drugs, medicaments and biological substances status: Secondary | ICD-10-CM | POA: Insufficient documentation

## 2016-04-12 DIAGNOSIS — K219 Gastro-esophageal reflux disease without esophagitis: Secondary | ICD-10-CM | POA: Diagnosis not present

## 2016-04-12 DIAGNOSIS — Z8262 Family history of osteoporosis: Secondary | ICD-10-CM | POA: Diagnosis not present

## 2016-04-12 DIAGNOSIS — G5602 Carpal tunnel syndrome, left upper limb: Secondary | ICD-10-CM | POA: Diagnosis not present

## 2016-04-12 DIAGNOSIS — E7800 Pure hypercholesterolemia, unspecified: Secondary | ICD-10-CM | POA: Diagnosis not present

## 2016-04-12 DIAGNOSIS — G43909 Migraine, unspecified, not intractable, without status migrainosus: Secondary | ICD-10-CM | POA: Insufficient documentation

## 2016-04-12 DIAGNOSIS — Z87442 Personal history of urinary calculi: Secondary | ICD-10-CM | POA: Insufficient documentation

## 2016-04-12 DIAGNOSIS — K449 Diaphragmatic hernia without obstruction or gangrene: Secondary | ICD-10-CM | POA: Insufficient documentation

## 2016-04-12 DIAGNOSIS — Z885 Allergy status to narcotic agent status: Secondary | ICD-10-CM | POA: Insufficient documentation

## 2016-04-12 DIAGNOSIS — Z90710 Acquired absence of both cervix and uterus: Secondary | ICD-10-CM | POA: Diagnosis not present

## 2016-04-12 DIAGNOSIS — Z79899 Other long term (current) drug therapy: Secondary | ICD-10-CM | POA: Diagnosis not present

## 2016-04-12 HISTORY — PX: CARPAL TUNNEL RELEASE: SHX101

## 2016-04-12 LAB — MRSA PCR SCREENING: MRSA by PCR: NEGATIVE

## 2016-04-12 SURGERY — CARPAL TUNNEL RELEASE
Anesthesia: Regional | Site: Wrist | Laterality: Left | Wound class: Clean

## 2016-04-12 MED ORDER — MIDAZOLAM HCL 2 MG/2ML IJ SOLN
INTRAMUSCULAR | Status: DC | PRN
  Administered 2016-04-12: 2 mg via INTRAVENOUS

## 2016-04-12 MED ORDER — LIDOCAINE HCL (PF) 0.5 % IJ SOLN
INTRAMUSCULAR | Status: AC
  Filled 2016-04-12: qty 50

## 2016-04-12 MED ORDER — NEOMYCIN-POLYMYXIN B GU 40-200000 IR SOLN
Status: DC | PRN
  Administered 2016-04-12: 2 mL

## 2016-04-12 MED ORDER — CLINDAMYCIN PHOSPHATE 900 MG/50ML IV SOLN
900.0000 mg | INTRAVENOUS | Status: AC
  Administered 2016-04-12: 900 mg via INTRAVENOUS

## 2016-04-12 MED ORDER — LIDOCAINE HCL (PF) 0.5 % IJ SOLN
INTRAMUSCULAR | Status: DC | PRN
Start: 2016-04-12 — End: 2016-04-12
  Administered 2016-04-12: 50 mL via INTRAVENOUS

## 2016-04-12 MED ORDER — CHLORHEXIDINE GLUCONATE 4 % EX LIQD: 60.0000 mL | Freq: Once | CUTANEOUS | Status: DC

## 2016-04-12 MED ORDER — ONDANSETRON HCL 4 MG/2ML IJ SOLN
INTRAMUSCULAR | Status: DC | PRN
Start: 2016-04-12 — End: 2016-04-12
  Administered 2016-04-12: 4 mg via INTRAVENOUS

## 2016-04-12 MED ORDER — PROPOFOL 500 MG/50ML IV EMUL
INTRAVENOUS | Status: DC | PRN
  Administered 2016-04-12: 50 ug/kg/min via INTRAVENOUS

## 2016-04-12 MED ORDER — LIDOCAINE HCL 2 % EX GEL
CUTANEOUS | Status: DC | PRN
  Administered 2016-04-12: 1 via TOPICAL

## 2016-04-12 MED ORDER — GLYCOPYRROLATE 0.2 MG/ML IJ SOLN
INTRAMUSCULAR | Status: DC | PRN
  Administered 2016-04-12: 0.2 mg via INTRAVENOUS

## 2016-04-12 MED ORDER — FENTANYL CITRATE (PF) 100 MCG/2ML IJ SOLN
INTRAMUSCULAR | Status: DC | PRN
  Administered 2016-04-12: 50 ug via INTRAVENOUS

## 2016-04-12 MED ORDER — ONDANSETRON HCL 4 MG/2ML IJ SOLN: 4.0000 mg | Freq: Once | INTRAMUSCULAR | Status: DC | PRN

## 2016-04-12 MED ORDER — FENTANYL CITRATE (PF) 100 MCG/2ML IJ SOLN: 25.0000 ug | INTRAMUSCULAR | Status: DC | PRN

## 2016-04-12 MED ORDER — BUPIVACAINE HCL (PF) 0.25 % IJ SOLN
INTRAMUSCULAR | Status: DC | PRN
  Administered 2016-04-12: 10 mL

## 2016-04-12 MED ORDER — LACTATED RINGERS IV SOLN
INTRAVENOUS | Status: DC
  Administered 2016-04-12: 15:00:00 via INTRAVENOUS

## 2016-04-12 MED ORDER — PROPOFOL 10 MG/ML IV BOLUS
INTRAVENOUS | Status: DC | PRN
  Administered 2016-04-12: 30 mg via INTRAVENOUS
  Administered 2016-04-12: 20 mg via INTRAVENOUS

## 2016-04-12 MED ORDER — ACETAMINOPHEN 10 MG/ML IV SOLN
INTRAVENOUS | Status: DC | PRN
  Administered 2016-04-12: 1000 mg via INTRAVENOUS

## 2016-04-12 SURGICAL SUPPLY — 29 items
BANDAGE ELASTIC 3 LF NS (GAUZE/BANDAGES/DRESSINGS) ×3 IMPLANT
BLADE SURG 15 STRL LF DISP TIS (BLADE) ×1 IMPLANT
BLADE SURG 15 STRL SS (BLADE) ×3
BNDG CMPR MED 5X3 ELC HKLP NS (GAUZE/BANDAGES/DRESSINGS) ×1
BNDG ESMARK 4X12 TAN STRL LF (GAUZE/BANDAGES/DRESSINGS) ×3 IMPLANT
CANISTER SUCT 1200ML W/VALVE (MISCELLANEOUS) ×3 IMPLANT
CAST PADDING 3X4FT ST 30246 (SOFTGOODS) ×2
CUFF TOURN 18 STER (MISCELLANEOUS) ×3 IMPLANT
DRSG DERMACEA 8X12 NADH (GAUZE/BANDAGES/DRESSINGS) ×3 IMPLANT
DURAPREP 26ML APPLICATOR (WOUND CARE) ×3 IMPLANT
ELECT CAUTERY BLADE 6.4 (BLADE) ×3 IMPLANT
ELECT REM PT RETURN 9FT ADLT (ELECTROSURGICAL) ×3
ELECTRODE REM PT RTRN 9FT ADLT (ELECTROSURGICAL) ×1 IMPLANT
GAUZE SPONGE 4X4 12PLY STRL (GAUZE/BANDAGES/DRESSINGS) ×3 IMPLANT
GLOVE BIOGEL M STRL SZ7.5 (GLOVE) ×3 IMPLANT
GLOVE INDICATOR 8.0 STRL GRN (GLOVE) ×3 IMPLANT
GOWN STRL REUS W/ TWL LRG LVL3 (GOWN DISPOSABLE) ×2 IMPLANT
GOWN STRL REUS W/TWL LRG LVL3 (GOWN DISPOSABLE) ×6
KIT RM TURNOVER STRD PROC AR (KITS) ×3 IMPLANT
NS IRRIG 500ML POUR BTL (IV SOLUTION) ×3 IMPLANT
PACK EXTREMITY ARMC (MISCELLANEOUS) ×3 IMPLANT
PAD CAST CTTN 3X4 STRL (SOFTGOODS) ×1 IMPLANT
PADDING CAST COTTON 3X4 STRL (SOFTGOODS) ×1
SOL PREP PVP 2OZ (MISCELLANEOUS) ×3
SOLUTION PREP PVP 2OZ (MISCELLANEOUS) ×1 IMPLANT
SPLINT CAST 1 STEP 3X12 (MISCELLANEOUS) ×3 IMPLANT
STOCKINETTE 48X4 2 PLY STRL (GAUZE/BANDAGES/DRESSINGS) ×1 IMPLANT
STOCKINETTE STRL 4IN 9604848 (GAUZE/BANDAGES/DRESSINGS) ×3 IMPLANT
SUT ETHILON 5-0 FS-2 18 BLK (SUTURE) ×3 IMPLANT

## 2016-04-12 NOTE — Transfer of Care (Signed)
Immediate Anesthesia Transfer of Care Note  Patient: Teresa Harris  Procedure(s) Performed: Procedure(s): CARPAL TUNNEL RELEASE (Left)  Patient Location: PACU  Anesthesia Type:General  Level of Consciousness: sedated  Airway & Oxygen Therapy: Patient Spontanous Breathing and Patient connected to face mask oxygen  Post-op Assessment: Report given to RN and Post -op Vital signs reviewed and stable  Post vital signs: Reviewed and stable  Last Vitals:  Filed Vitals:   04/12/16 1411 04/12/16 1722  BP: 145/75 100/74  Pulse: 83 78  Temp: 36.4 C 36.3 C  Resp: 16 13    Complications: No apparent anesthesia complications

## 2016-04-12 NOTE — Anesthesia Preprocedure Evaluation (Signed)
Anesthesia Evaluation  Patient identified by MRN, date of birth, ID band Patient awake    History of Anesthesia Complications (+) PONV  Airway Mallampati: II       Dental  (+) Upper Dentures   Pulmonary former smoker,    breath sounds clear to auscultation       Cardiovascular hypertension, Pt. on home beta blockers + dysrhythmias  Rhythm:Regular Rate:Normal     Neuro/Psych Depression    GI/Hepatic Neg liver ROS, hiatal hernia, GERD  Medicated,  Endo/Other  negative endocrine ROS  Renal/GU      Musculoskeletal   Abdominal Normal abdominal exam  (+)   Peds  Hematology negative hematology ROS (+)   Anesthesia Other Findings   Reproductive/Obstetrics                             Anesthesia Physical Anesthesia Plan  ASA: III  Anesthesia Plan: Bier Block   Post-op Pain Management:    Induction: Intravenous  Airway Management Planned: Natural Airway and Nasal Cannula  Additional Equipment:   Intra-op Plan:   Post-operative Plan:   Informed Consent: I have reviewed the patients History and Physical, chart, labs and discussed the procedure including the risks, benefits and alternatives for the proposed anesthesia with the patient or authorized representative who has indicated his/her understanding and acceptance.     Plan Discussed with: CRNA  Anesthesia Plan Comments:         Anesthesia Quick Evaluation

## 2016-04-12 NOTE — Anesthesia Procedure Notes (Addendum)
Date/Time: 04/12/2016 4:16 PM Performed by: Doreen Salvage Pre-anesthesia Checklist: Patient identified, Emergency Drugs available, Suction available and Patient being monitored Patient Re-evaluated:Patient Re-evaluated prior to inductionOxygen Delivery Method: Nasal cannula Intubation Type: IV induction Dental Injury: Teeth and Oropharynx as per pre-operative assessment  Comments: Nasal cannula with etCO2 monitoring   Anesthesia Regional Block:  Bier block (IV Regional)  Pre-Anesthetic Checklist: ,, timeout performed, Correct Patient, Correct Site, Correct Laterality, Correct Procedure, Correct Position, site marked, Risks and benefits discussed,  Surgical consent,  Pre-op evaluation,  At surgeon's request and post-op pain management  Laterality: Left  Prep: alcohol swabs       Needles:  Injection technique: Single-shot      Needle Gauge: 22 and 22 G    Additional Needles: Bier block (IV Regional) Narrative:  Injection made incrementally with aspirations every 5 mL.  Performed by: Personally   Additional Notes: The patient tolerated the procedure well.

## 2016-04-12 NOTE — H&P (Signed)
The patient has been re-examined, and the chart reviewed, and there have been no interval changes to the documented history and physical.    The risks, benefits, and alternatives have been discussed at length. The patient expressed understanding of the risks benefits and agreed with plans for surgical intervention.  James P. Hooten, Jr. M.D.    

## 2016-04-12 NOTE — Op Note (Signed)
OPERATIVE NOTE  DATE OF SURGERY:  04/12/2016  PATIENT NAME:  Teresa Harris   DOB: 1941-02-05  MRN: [id number]  PRE-OPERATIVE DIAGNOSIS: Left carpal tunnel syndrome  POST-OPERATIVE DIAGNOSIS:  Same  PROCEDURE:  Left carpal tunnel release  SURGEON:  Marciano Sequin. M.D.  ANESTHESIA: Bier block  ESTIMATED BLOOD LOSS: Minimal  FLUIDS REPLACED: 800 mL of crystalloid  TOURNIQUET TIME: 41 minutes  DRAINS: None  INDICATIONS FOR SURGERY: Teresa Harris is a 75 y.o. year old female with a long history of numbness and paresthesias to the left hand. EMG/nerve conduction studies demonstrated findings consistent with carpal tunnel syndrome.The patient had not seen any significant improvement despite conservative nonsurgical intervention. After discussion of the risks and benefits of surgical intervention, the patient expressed understanding of the risks benefits and agree with plans for carpal tunnel release.   PROCEDURE IN DETAIL: The patient was brought into the operating room and  "time-out" was performed as per usual protocol. After adequate Bier block, the left hand and arm were prepped with alcohol and Duraprep and draped in the usual sterile fashion.  Loupe magnification was used throughout the procedure. An incision was made just ulnar to the thenar palmar crease. Dissection was carried down through the palmar fascia to the transverse carpal ligament. The transverse carpal ligament was sharply incised, taking care to protect the underlying structures with the carpal tunnel. Complete release of the transverse carpal ligament was achieved. There was no evidence of ganglion cyst or lipoma within the carpal tunnel. The wound was irrigated with copious amounts of normal saline with antibiotic solution. The skin was then re-approximated with interrupted sutures of #5-0 nylon. A sterile dressing was applied followed by application of a volar splint. The tourniquet was deflated with a total  tourniquet time of 41 minutes.  The patient tolerated the procedure well and was transported to the PACU in stable condition.  James P. Holley Bouche., M.D.

## 2016-04-12 NOTE — Brief Op Note (Signed)
04/12/2016  5:24 PM  PATIENT:  Teresa Harris  75 y.o. female  PRE-OPERATIVE DIAGNOSIS:  CARPAL TUNNEL SYNDROME, left  POST-OPERATIVE DIAGNOSIS:  Same  PROCEDURE:  Procedure(s): CARPAL TUNNEL RELEASE (Left)  SURGEON:  Surgeon(s) and Role:    * Dereck Leep, MD - Primary  ASSISTANTS: none   ANESTHESIA:   Bier block  EBL:  Total I/O In: 800 [I.V.:800] Out: -   BLOOD ADMINISTERED:none  DRAINS: none   LOCAL MEDICATIONS USED:  MARCAINE     SPECIMEN:  No Specimen  DISPOSITION OF SPECIMEN:  N/A  COUNTS:  YES  TOURNIQUET:   41 minutes  DICTATION: .Dragon Dictation  PLAN OF CARE: Discharge to home after PACU  PATIENT DISPOSITION:  PACU - hemodynamically stable.   Delay start of Pharmacological VTE agent (>24hrs) due to surgical blood loss or risk of bleeding: not applicable

## 2016-04-12 NOTE — Progress Notes (Signed)
Call received from [facility], CRNA @ [phone] requesting #22G SL be started in left hand for bier block, done by Phillips Grout, RN

## 2016-04-13 ENCOUNTER — Encounter: Payer: Self-pay | Admitting: Orthopedic Surgery

## 2016-04-13 NOTE — Anesthesia Postprocedure Evaluation (Signed)
Anesthesia Post Note  Patient: Teresa Harris  Procedure(s) Performed: Procedure(s) (LRB): CARPAL TUNNEL RELEASE (Left)  Patient location during evaluation: PACU Anesthesia Type: Bier Block Level of consciousness: awake Pain management: pain level controlled Vital Signs Assessment: post-procedure vital signs reviewed and stable Respiratory status: spontaneous breathing Cardiovascular status: blood pressure returned to baseline Anesthetic complications: no    Last Vitals:  Filed Vitals:   04/12/16 1810 04/12/16 1829  BP: 133/98 129/84  Pulse: 77 85  Temp: 36.1 C 36.4 C  Resp: 20 18    Last Pain:  Filed Vitals:   04/12/16 1830  PainSc: 0-No pain                 VAN STAVEREN,Golden Emile

## 2016-06-03 ENCOUNTER — Telehealth: Payer: Self-pay

## 2016-06-03 ENCOUNTER — Ambulatory Visit
Admission: RE | Admit: 2016-06-03 | Discharge: 2016-06-03 | Disposition: A | Discharge status: Home / Self Care | Payer: PPO | Source: Ambulatory Visit | Attending: Physician Assistant | Admitting: Physician Assistant

## 2016-06-03 ENCOUNTER — Other Ambulatory Visit: Payer: Self-pay | Admitting: Physician Assistant

## 2016-06-03 DIAGNOSIS — Z1239 Encounter for other screening for malignant neoplasm of breast: Secondary | ICD-10-CM

## 2016-06-03 DIAGNOSIS — Z1231 Encounter for screening mammogram for malignant neoplasm of breast: Secondary | ICD-10-CM | POA: Diagnosis not present

## 2016-06-03 NOTE — Telephone Encounter (Signed)
Patient advised as directed below.  Thanks,  -Joseline 

## 2016-06-03 NOTE — Telephone Encounter (Signed)
-----   Message from [name], PA-C sent at 06/03/2016  9:17 AM EDT ----- Normal mammogram. Repeat screening in one year.

## 2016-06-17 ENCOUNTER — Other Ambulatory Visit: Payer: Self-pay

## 2016-06-17 MED ORDER — APIXABAN 5 MG PO TABS: 5.0000 mg | ORAL_TABLET | Freq: Two times a day (BID) | ORAL | Status: DC

## 2016-06-17 NOTE — Telephone Encounter (Signed)
Refill sent for Eliquis 5 mg  

## 2016-06-18 ENCOUNTER — Telehealth: Payer: Self-pay | Admitting: Cardiovascular Disease

## 2016-06-18 NOTE — Telephone Encounter (Signed)
Patient wants to know if Dr. Fletcher Anon is ok with her taking medication for joint health Arthritis   Advanced [place] by Redd remedies dosage recommended is 2 tablets per day   Please call.

## 2016-06-21 NOTE — Telephone Encounter (Signed)
That's fine

## 2016-06-21 NOTE — Telephone Encounter (Signed)
Left detailed message with CB number if questions on pt cell VM

## 2016-06-21 NOTE — Telephone Encounter (Signed)
S/w pt who asks if she can take [place] by Redd for her arthritis. She has not taken any as she will await Dr. Tyrell Antonio advice.  [place] contains turmeric and boswellia

## 2016-07-05 DIAGNOSIS — H401123 Primary open-angle glaucoma, left eye, severe stage: Secondary | ICD-10-CM | POA: Diagnosis not present

## 2016-07-06 DIAGNOSIS — Z1283 Encounter for screening for malignant neoplasm of skin: Secondary | ICD-10-CM | POA: Diagnosis not present

## 2016-07-06 DIAGNOSIS — L718 Other rosacea: Secondary | ICD-10-CM | POA: Diagnosis not present

## 2016-07-06 DIAGNOSIS — L720 Epidermal cyst: Secondary | ICD-10-CM | POA: Diagnosis not present

## 2016-07-13 DIAGNOSIS — H401123 Primary open-angle glaucoma, left eye, severe stage: Secondary | ICD-10-CM | POA: Diagnosis not present

## 2016-07-19 ENCOUNTER — Other Ambulatory Visit: Payer: Self-pay

## 2016-07-19 DIAGNOSIS — M1990 Unspecified osteoarthritis, unspecified site: Secondary | ICD-10-CM

## 2016-07-19 NOTE — Telephone Encounter (Signed)
Lat ov 11/27/15. Patient was advised to schedule an appt before anymore refills on 12/22/2015. Pt has an appt on 12/09/2016

## 2016-07-19 NOTE — Telephone Encounter (Signed)
Is patient taking celebrex with eliquis?

## 2016-07-19 NOTE — Telephone Encounter (Signed)
LMTCB

## 2016-07-20 MED ORDER — CELECOXIB 200 MG PO CAPS: ORAL_CAPSULE | ORAL | 5 refills | Status: DC

## 2016-07-20 NOTE — Telephone Encounter (Signed)
Pt is returning call.  [id number]

## 2016-07-20 NOTE — Telephone Encounter (Signed)
Patient reports that she is taking celebrex with eliquis. Pt has an appt scheduled for 12/09/16.

## 2016-07-20 NOTE — Telephone Encounter (Signed)
LMTCB

## 2016-07-20 NOTE — Telephone Encounter (Signed)
Pt returned your call.  [phone] that's her work number.  Thanks, [employer]

## 2016-08-10 DIAGNOSIS — H401123 Primary open-angle glaucoma, left eye, severe stage: Secondary | ICD-10-CM | POA: Diagnosis not present

## 2016-08-17 ENCOUNTER — Other Ambulatory Visit: Payer: Self-pay | Admitting: *Deleted

## 2016-08-17 MED ORDER — DIGOXIN 125 MCG PO TABS: 0.1250 mg | ORAL_TABLET | ORAL | 3 refills | Status: DC

## 2016-08-25 ENCOUNTER — Encounter: Payer: Self-pay | Admitting: Podiatry

## 2016-08-25 ENCOUNTER — Ambulatory Visit (INDEPENDENT_AMBULATORY_CARE_PROVIDER_SITE_OTHER): Payer: PPO | Admitting: Podiatry

## 2016-08-25 DIAGNOSIS — Q828 Other specified congenital malformations of skin: Secondary | ICD-10-CM | POA: Diagnosis not present

## 2016-08-25 NOTE — Progress Notes (Signed)
She presents today for follow-up of a porokeratotic lesion to the plantar aspect of her right foot. She is also concerned about a possible ingrown nail third digit left foot.  Objective: Vital signs are stable alert and oriented 3. Pulses are strongly palpable bilateral. Sharp irrigated nail margin along the tibia-fibula border of the third digit of the left foot does not demonstrate any erythema C Lashandra odor and no replication of pain on palpation. There she does have a solitary porokeratotic lesion plantar aspect of the right forefoot without signs of infection.  Assessment: Porokeratosis right foot. Ingrown nail tibial fibular border third digit left foot.  Plan: At this point I debrided the sharp margins of the nail. I also debrided the reactive hyperkeratosis to deep nucleated. I then applied Cantharone under occlusion to be washed off tomorrow morning. I will follow-up with her on an as-needed basis.

## 2016-08-26 ENCOUNTER — Encounter: Payer: Self-pay | Admitting: Physician Assistant

## 2016-08-26 ENCOUNTER — Ambulatory Visit (INDEPENDENT_AMBULATORY_CARE_PROVIDER_SITE_OTHER): Payer: PPO | Admitting: Physician Assistant

## 2016-08-26 VITALS — BP 100/60 | HR 78 | Temp 97.8°F | Resp 20 | Wt 185.0 lb

## 2016-08-26 DIAGNOSIS — M533 Sacrococcygeal disorders, not elsewhere classified: Secondary | ICD-10-CM | POA: Diagnosis not present

## 2016-08-26 DIAGNOSIS — Z23 Encounter for immunization: Secondary | ICD-10-CM | POA: Diagnosis not present

## 2016-08-26 DIAGNOSIS — N951 Menopausal and female climacteric states: Secondary | ICD-10-CM

## 2016-08-26 MED ORDER — ESTROGENS CONJUGATED 0.625 MG PO TABS: 0.6250 mg | ORAL_TABLET | Freq: Every day | ORAL | 3 refills | Status: DC

## 2016-08-26 NOTE — Patient Instructions (Signed)
Tailbone Injury The tailbone (coccyx) is the small bone at the lower end of the spine. A tailbone injury may involve stretched ligaments, bruising, or a broken bone (fracture). Tailbone injuries can be painful, and some may take a long time to heal. CAUSES This condition is often caused by falling and landing on the tailbone. Other causes include:  Repeated strain or friction from actions such as rowing and bicycling.  Childbirth. In some cases, the cause may not be known. RISK FACTORS This condition is more common in women than in men. SYMPTOMS Symptoms of this condition include:  Pain in the lower back, especially when sitting.  Pain or difficulty when standing up from a sitting position.  Bruising in the tailbone area.  Painful bowel movements.  In women, pain during intercourse. DIAGNOSIS This condition may be diagnosed based on your symptoms and a physical exam. X-rays may be taken if a fracture is suspected. You may also have other tests, such as a CT scan or MRI. TREATMENT This condition may be treated with medicines to help relieve your pain. Most tailbone injuries heal on their own in 4-6 weeks. However, recovery time may be longer if the injury involves a fracture. HOME CARE INSTRUCTIONS  Take medicines only as directed by your health care provider.  If directed, apply ice to the injured area:  Put ice in a plastic bag.  Place a towel between your skin and the bag.  Leave the ice on for 20 minutes, 2-3 times per day for the first 1-2 days.  Sit on a large, rubber or inflated ring or cushion to ease your pain. Lean forward when you are sitting to help decrease discomfort.  Avoid sitting for long periods of time.  Increase your activity as the pain allows. Perform any exercises that are recommended by your health care provider or physical therapist.  If you have pain during bowel movements, use stool softeners as directed by your health care provider.  Eat a  diet that includes plenty of fiber to help prevent constipation.  Keep all follow-up visits as directed by your health care provider. This is important. PREVENTION Wear appropriate padding and sports gear when bicycling and rowing. This can help to prevent developing an injury that is caused by repeated strain or friction. SEEK MEDICAL CARE IF:  Your pain becomes worse.  Your bowel movements cause a great deal of discomfort.  You are unable to have a bowel movement.  You have uncontrolled urine loss (urinary incontinence).  You have a fever.   This information is not intended to replace advice given to you by your health care provider. Make sure you discuss any questions you have with your health care provider.   Document Released: 11/19/2000 Document Revised: 04/08/2015 Document Reviewed: 11/18/2014 Elsevier Interactive Patient Education [employer].

## 2016-08-26 NOTE — Progress Notes (Signed)
Patient: Teresa Harris Female    DOB: 1941/02/04   75 y.o.   MRN: [id number] Visit Date: 08/26/2016  Today's Provider: [name], PA-C   Chief Complaint  Patient presents with  . Hypertension  . Hot Flashes  . Hip Pain   Subjective:    HPI  Hypertension, follow-up:  BP Readings from Last 3 Encounters:  08/26/16 100/60  04/12/16 129/84  04/08/16 120/70    She was last seen for hypertension 9 months ago.  BP at that visit was 129/84. Management changes since that visit include no changes. She reports excellent compliance with treatment. She is not having side effects.  She is not exercising. She is adherent to low salt diet.   Outside blood pressures are stable. She is experiencing none.  Patient denies chest pain and lower extremity edema.   Cardiovascular risk factors include advanced age (older than 91 for men, 98 for women) and hypertension.  Use of agents associated with hypertension: none.     Weight trend: stable Wt Readings from Last 3 Encounters:  08/26/16 185 lb (83.9 kg)  04/12/16 190 lb (86.2 kg)  04/08/16 190 lb 8 oz (86.4 kg)    Current diet: in general, a "healthy" diet    ------------------------------------------------------------------------  Hot flashes: Patient c/o worsening hot flashes since not taking Premarin due to cost.  Hip pain: Patient c/o left hip pain for about 3-4 months. Patient reports pain is present when she sit or lays on her left side. Patient denies injury.    Allergies  Allergen Reactions  . [name] [Moxifloxacin Hcl In Nacl]   . Bimatoprost     Other reaction(s): Other (See Comments) Made my eyes red, and hurt  . Brimonidine     Other reaction(s): Other (See Comments) Made my eyes red, and hurt  . Keflex [Cephalexin]     headache  . Mobic [Meloxicam] Other (See Comments)    headache  . Moxifloxacin     Other reaction(s): Headache  . Propofol     Other reaction(s): Other (See Comments) Had  difficulty waking up  . Travoprost   . Vicodin [Hydrocodone-Acetaminophen] Nausea And Vomiting    Sick on stomach Sick on stomach     Current Outpatient Prescriptions:  .  apixaban (ELIQUIS) 5 MG TABS tablet, Take 1 tablet (5 mg total) by mouth 2 (two) times daily., Disp: 60 tablet, Rfl: 6 .  calcium carbonate (OS-CAL) 600 MG TABS, Take 600 mg by mouth daily with breakfast. , Disp: , Rfl:  .  celecoxib (CELEBREX) 200 MG capsule, TAKE 1 CAPSULE BY MOUTH ONCE DAILY., Disp: 30 capsule, Rfl: 5 .  cycloSPORINE (RESTASIS) 0.05 % ophthalmic emulsion, Place 1 drop into both eyes 2 (two) times daily. , Disp: , Rfl:  .  digoxin (LANOXIN) 0.125 MG tablet, Take 1 tablet (0.125 mg total) by mouth every morning., Disp: 30 tablet, Rfl: 3 .  dorzolamide-timolol (COSOPT) 22.3-6.8 MG/ML ophthalmic solution, Place 1 drop into the right eye 2 (two) times daily. , Disp: , Rfl:  .  Magnesium 400 MG CAPS, Take 400 mg by mouth 2 (two) times daily., Disp: , Rfl:  .  metoprolol succinate (TOPROL-XL) 25 MG 24 hr tablet, Take 0.5 tablets (12.5 mg total) by mouth daily. (Patient taking differently: Take 12.5 mg by mouth at bedtime. ), Disp: 30 tablet, Rfl: 6 .  Multiple Vitamin (MULTIVITAMIN) tablet, Take 1 tablet by mouth daily., Disp: , Rfl:  .  Omega-3 Fatty  Acids (FISH OIL) 1200 MG CAPS, Take 1,200 mg by mouth 2 (two) times daily., Disp: , Rfl:  .  omeprazole (PRILOSEC) 40 MG capsule, Take 40 mg by mouth every morning. , Disp: , Rfl:  .  PREMARIN 0.625 MG tablet, TAKE 1 TABLET BY MOUTH ONCE DAILY (Patient not taking: Reported on 08/26/2016), Disp: 30 tablet, Rfl: 5  Review of Systems  Constitutional: Negative.        Hot flashes  Respiratory: Negative.   Cardiovascular: Negative.   Gastrointestinal: Negative.   Musculoskeletal: Positive for arthralgias and myalgias.  Neurological: Negative.     Social History  Substance Use Topics  . Smoking status: Former Smoker    Packs/day: 1.00    Years: 30.00     Types: Cigarettes    Quit date: 02/09/1996  . Smokeless tobacco: Never Used     Comment: Quit in 2000  . Alcohol use No   Objective:   BP 100/60 (BP Location: Right Arm, Patient Position: Sitting, Cuff Size: Large)   Pulse 78   Temp 97.8 F (36.6 C) (Oral)   Resp 20   Wt 185 lb (83.9 kg)   SpO2 96%   BMI 28.13 kg/m   Physical Exam  Constitutional: She appears well-developed and well-nourished. No distress.  Neck: Normal range of motion. Neck supple.  Cardiovascular: Normal rate, regular rhythm and normal heart sounds.  Exam reveals no gallop and no friction rub.   No murmur heard. Pulmonary/Chest: Effort normal and breath sounds normal. No respiratory distress. She has no wheezes. She has no rales.  Musculoskeletal: She exhibits no edema.       Lumbar back: Normal.  Tenderness over the coccyx  Skin: She is not diaphoretic.  Vitals reviewed.       Assessment & Plan:     1. Hot flashes, menopausal Uncontrolled without premarin. Will restart as below. She is to call if no improvement. - estrogens, conjugated, (PREMARIN) 0.625 MG tablet; Take 1 tablet (0.625 mg total) by mouth daily. Take daily for 21 days then do not take for 7 days.  Dispense: 90 tablet; Refill: 3  2. Coccydynia Discussed conservative treatments such as donut cushion and epsom salt soaks. She is to call if symptoms fail to improve.   3. Need for influenza vaccination Flu vaccine given today without complication. Patient sat upright for 15 minutes to check for adverse reaction before being released. - Flu vaccine HIGH DOSE PF (Fluzone High dose)       [name], PA-C  [facility]

## 2016-10-11 ENCOUNTER — Encounter: Payer: Self-pay | Admitting: Cardiovascular Disease

## 2016-10-11 ENCOUNTER — Ambulatory Visit (INDEPENDENT_AMBULATORY_CARE_PROVIDER_SITE_OTHER): Payer: PPO | Admitting: Cardiovascular Disease

## 2016-10-11 VITALS — BP 140/70 | HR 74 | Ht 68.0 in | Wt 185.8 lb

## 2016-10-11 DIAGNOSIS — I482 Chronic atrial fibrillation, unspecified: Secondary | ICD-10-CM

## 2016-10-11 DIAGNOSIS — R002 Palpitations: Secondary | ICD-10-CM

## 2016-10-11 DIAGNOSIS — I10 Essential (primary) hypertension: Secondary | ICD-10-CM

## 2016-10-11 NOTE — Patient Instructions (Signed)
Medication Instructions: Continue same medications.   Labwork: None.   Procedures/Testing: None.   Follow-Up: 6 months with Dr. Wael Maestas.   Any Additional Special Instructions Will Be Listed Below (If Applicable).     If you need a refill on your cardiac medications before your next appointment, please call your pharmacy.   

## 2016-10-11 NOTE — Progress Notes (Signed)
Cardiology Office Note   Date:  10/11/2016   ID:  Teresa Harris, DOB 14-Aug-1941, MRN [id number]  PCP:  Margarita Rana, MD  Cardiologist:   Kathlyn Sacramento, MD   Chief Complaint  Patient presents with  . Hypertension  . Palpitations  . Atrial Fibrillation      History of Present Illness: Teresa Harris is a 75 y.o. female who presents for a follow-up regarding chronic atrial fibrillation.  She has borderline diabetes and hyperlipidemia not requiring medications. She smoked in the past more than 30 years ago. There is no family history of coronary artery disease or arrhythmia.  She was diagnosed with symptomatic atrial fibrillation in 2015.  She underwent a nuclear stress test in November 2015 which showed no evidence of ischemia with normal ejection fraction. Echocardiogram showed normal EF with mild to moderate mitral regurgitation and mildly dilated left atrium. She complained of fatigue with metoprolol and was switched to diltiazem but that was not effective. She went back on Toprol.  She reports intolerance to medications and does not like taking higher doses. She mentions that half a tablet of metoprolol 25 mg is all what she can take. She is also on small dose digoxin. She reports stable symptoms overall although she seems to be aware of irregular heartbeats especially at night. She doesn't necessarily have tachycardia. She did increase caffeine intake recently and had some worsening of symptoms.  Past Medical History:  Diagnosis Date  . Arthritis   . Atrial fibrillation ([place]) 10/01/14  . Complication of anesthesia    trouble waking up with Propofol  . Depression    h/o  . Dysrhythmia    a fib  . Family history of adverse reaction to anesthesia   . GERD (gastroesophageal reflux disease)   . Glaucoma    left eye  . Headache    H/O MIGRAINES  . Hemorrhoids   . Hiatal hernia   . History of migraine headaches   . Hx MRSA infection   . Hypercholesteremia   .  Kidney calculi   . Osteopenia   . PONV (postoperative nausea and vomiting)     Past Surgical History:  Procedure Laterality Date  . ABDOMINAL HYSTERECTOMY    . AUGMENTATION MAMMAPLASTY Bilateral 2004  . CARPAL TUNNEL RELEASE Right 02/16/2016   Procedure: CARPAL TUNNEL RELEASE;  Surgeon: Dereck Leep, MD;  Location: [facility] ORS;  Service: Orthopedics;  Laterality: Right;  . CARPAL TUNNEL RELEASE Left 04/12/2016   Procedure: CARPAL TUNNEL RELEASE;  Surgeon: Dereck Leep, MD;  Location: [facility] ORS;  Service: Orthopedics;  Laterality: Left;  . COLONOSCOPY  12/29/09   Dr Dionne Milo  . EYE SURGERY Left    shunt placed  . FOOT SURGERY     bilateral   . JOINT REPLACEMENT    . NASAL SEPTUM SURGERY    . REPLACEMENT TOTAL KNEE     bilateral  . SHOULDER SURGERY     right shoulder     Current Outpatient Prescriptions  Medication Sig Dispense Refill  . apixaban (ELIQUIS) 5 MG TABS tablet Take 1 tablet (5 mg total) by mouth 2 (two) times daily. 60 tablet 6  . calcium carbonate (OS-CAL) 600 MG TABS Take 600 mg by mouth daily with breakfast.     . celecoxib (CELEBREX) 200 MG capsule TAKE 1 CAPSULE BY MOUTH ONCE DAILY. 30 capsule 5  . cycloSPORINE (RESTASIS) 0.05 % ophthalmic emulsion Place 1 drop into both eyes 2 (two) times daily.     Marland Kitchen  digoxin (LANOXIN) 0.125 MG tablet Take 1 tablet (0.125 mg total) by mouth every morning. 30 tablet 3  . dorzolamide-timolol (COSOPT) 22.3-6.8 MG/ML ophthalmic solution Place 1 drop into the right eye 2 (two) times daily.     Marland Kitchen estrogens, conjugated, (PREMARIN) 0.625 MG tablet Take 1 tablet (0.625 mg total) by mouth daily. Take daily for 21 days then do not take for 7 days. 90 tablet 3  . Magnesium 400 MG CAPS Take 400 mg by mouth 2 (two) times daily.    . metoprolol succinate (TOPROL-XL) 25 MG 24 hr tablet Take 0.5 tablets (12.5 mg total) by mouth daily. 30 tablet 6  . Multiple Vitamin (MULTIVITAMIN) tablet Take 1 tablet by mouth daily.    . Omega-3 Fatty Acids (FISH  OIL) 1200 MG CAPS Take 1,200 mg by mouth 2 (two) times daily.    Marland Kitchen omeprazole (PRILOSEC) 40 MG capsule Take 40 mg by mouth every morning.      No current facility-administered medications for this visit.     Allergies:   [name] [moxifloxacin hcl in nacl]; Bimatoprost; Brimonidine; Keflex [cephalexin]; Mobic [meloxicam]; Moxifloxacin; Propofol; Travoprost; and Vicodin [hydrocodone-acetaminophen]    Social History:  The patient  reports that she quit smoking about 20 years ago. Her smoking use included Cigarettes. She has a 30.00 pack-year smoking history. She has never used smokeless tobacco. She reports that she does not drink alcohol or use drugs.   Family History:  The patient's family history includes Arthritis in her father and mother.    ROS:  Please see the history of present illness.   Otherwise, review of systems are positive for none.   All other systems are reviewed and negative.    PHYSICAL EXAM: VS:  BP 140/70   Pulse 74   Ht 5\' 8"  (1.727 m)   Wt 185 lb 12.8 oz (84.3 kg)   SpO2 97%   BMI 28.25 kg/m  , BMI Body mass index is 28.25 kg/m. GEN: Well nourished, well developed, in no acute distress HEENT: normal Neck: no JVD, carotid bruits, or masses Cardiac: Irregularly irregular ; no murmurs, rubs, or gallops,no edema  Respiratory:  clear to auscultation bilaterally, normal work of breathing GI: soft, nontender, nondistended, + BS MS: no deformity or atrophy Skin: warm and dry, no rash Neuro:  Strength and sensation are intact Psych: euthymic mood, full affect   EKG:  EKG is ordered today. The ekg ordered today demonstrates atrial fibrillation with low voltage and nonspecific ST changes.    Recent Labs: 11/27/2015: ALT 23; TSH 1.050 04/08/2016: BUN 14; Creatinine, Ser 0.72; Hemoglobin 14.0; Platelets 188; Potassium 4.0; Sodium 136    Lipid Panel    Component Value Date/Time   CHOL 194 11/27/2015 1021   TRIG 240 (H) 11/27/2015 1021   HDL 48 11/27/2015 1021    CHOLHDL 4.0 11/27/2015 1021   LDLCALC 98 11/27/2015 1021      Wt Readings from Last 3 Encounters:  10/11/16 185 lb 12.8 oz (84.3 kg)  08/26/16 185 lb (83.9 kg)  04/12/16 190 lb (86.2 kg)       ASSESSMENT AND PLAN:  Chronic atrial fibrillation: Ventricular rate is reasonably controlled with small dose digoxin and metoprolol. She is tolerating anticoagulation with no side effects.  We have discussed cardioversion in the past and this was discussed again during today's visit per her request. I explained to her that cardioversion is an option although the chance of success goes down more the more she has been in  A. Fib. I don't see that her symptoms have worsened and she seems to be doing reasonably well with rate control. We will continue with this strategy for now.    Disposition:   FU with me in 6 months  Signed,  Kathlyn Sacramento, MD  10/11/2016 1:47 PM    [place]

## 2016-12-03 ENCOUNTER — Other Ambulatory Visit: Payer: Self-pay

## 2016-12-03 DIAGNOSIS — I482 Chronic atrial fibrillation, unspecified: Secondary | ICD-10-CM

## 2016-12-03 MED ORDER — METOPROLOL SUCCINATE ER 25 MG PO TB24: 12.5000 mg | ORAL_TABLET | Freq: Every day | ORAL | 6 refills | Status: DC

## 2016-12-09 ENCOUNTER — Encounter: Payer: BLUE CROSS/BLUE SHIELD | Admitting: Physician Assistant

## 2016-12-13 ENCOUNTER — Other Ambulatory Visit: Payer: Self-pay

## 2016-12-13 MED ORDER — DIGOXIN 125 MCG PO TABS: 0.1250 mg | ORAL_TABLET | ORAL | 3 refills | Status: DC

## 2016-12-13 MED ORDER — APIXABAN 5 MG PO TABS: 5.0000 mg | ORAL_TABLET | Freq: Two times a day (BID) | ORAL | 6 refills | Status: DC

## 2016-12-13 NOTE — Telephone Encounter (Signed)
Refill sent for Eliquis 5 mg  

## 2016-12-21 ENCOUNTER — Encounter: Payer: Self-pay | Admitting: Physician Assistant

## 2016-12-21 ENCOUNTER — Ambulatory Visit (INDEPENDENT_AMBULATORY_CARE_PROVIDER_SITE_OTHER): Payer: PPO | Admitting: Physician Assistant

## 2016-12-21 VITALS — BP 128/82 | HR 78 | Temp 97.8°F | Resp 16 | Ht 68.0 in | Wt 185.0 lb

## 2016-12-21 DIAGNOSIS — M8580 Other specified disorders of bone density and structure, unspecified site: Secondary | ICD-10-CM

## 2016-12-21 DIAGNOSIS — I10 Essential (primary) hypertension: Secondary | ICD-10-CM

## 2016-12-21 DIAGNOSIS — N951 Menopausal and female climacteric states: Secondary | ICD-10-CM

## 2016-12-21 DIAGNOSIS — E7800 Pure hypercholesterolemia, unspecified: Secondary | ICD-10-CM | POA: Diagnosis not present

## 2016-12-21 DIAGNOSIS — Z0000 Encounter for general adult medical examination without abnormal findings: Secondary | ICD-10-CM

## 2016-12-21 DIAGNOSIS — Z780 Asymptomatic menopausal state: Secondary | ICD-10-CM

## 2016-12-21 MED ORDER — ESTROGENS CONJUGATED 0.625 MG PO TABS: 0.6250 mg | ORAL_TABLET | Freq: Every day | ORAL | 3 refills | Status: DC

## 2016-12-21 NOTE — Progress Notes (Signed)
Patient: Teresa Harris, Female    DOB: 03/27/1941, 76 y.o.   MRN: [id number] Visit Date: 12/21/2016  Today's Provider: [name], PA-C   Chief Complaint  Patient presents with  . Medicare Wellness   Subjective:    Annual wellness visit [name] is a 76 y.o. female. She feels well. She reports exercising. She reports she is sleeping fairly well.  AWE: 11/27/15 Pap:12/03/10-Neg Mammogram: 06/03/16 BI-RADS 1 Colon: 12/19/09-Normal BMD: 12/03/10-Osteopenia -----------------------------------------------------------   Review of Systems  Constitutional: Negative.   HENT: Negative.   Eyes: Positive for photophobia and visual disturbance.  Respiratory: Negative.   Cardiovascular: Negative.   Gastrointestinal: Negative.   Endocrine: Negative.   Genitourinary: Negative.   Musculoskeletal: Positive for neck pain.  Skin: Negative.   Allergic/Immunologic: Negative.   Neurological: Negative.   Hematological: Negative.   Psychiatric/Behavioral: Negative.     Social History   Social History  . Marital status: Divorced    Spouse name: N/A  . Number of children: N/A  . Years of education: N/A   Occupational History  . Not on file.   Social History Main Topics  . Smoking status: Former Smoker    Packs/day: 1.00    Years: 30.00    Types: Cigarettes    Quit date: 02/09/1996  . Smokeless tobacco: Never Used     Comment: Quit in 2000  . Alcohol use No  . Drug use: No  . Sexual activity: Not on file   Other Topics Concern  . Not on file   Social History Narrative  . No narrative on file    Past Medical History:  Diagnosis Date  . Arthritis   . Atrial fibrillation ([place]) 10/01/14  . Complication of anesthesia    trouble waking up with Propofol  . Depression    h/o  . Dysrhythmia    a fib  . Family history of adverse reaction to anesthesia   . GERD (gastroesophageal reflux disease)   . Glaucoma    left eye  . Headache    H/O  MIGRAINES  . Hemorrhoids   . Hiatal hernia   . History of migraine headaches   . Hx MRSA infection   . Hypercholesteremia   . Kidney calculi   . Osteopenia   . PONV (postoperative nausea and vomiting)      Patient Active Problem List   Diagnosis Date Noted  . Avitaminosis D 11/18/2015  . Osteopenia 11/18/2015  . HLD (hyperlipidemia) 11/18/2015  . High potassium 11/18/2015  . Post menopausal syndrome 11/18/2015  . 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 Atrial fibrillation, chronic ([place]) 11/18/2015  . Adverse effect of anesthesia 11/18/2015  . Hypertension 06/23/2015  . Arthritis 05/22/2015  . Internal hemorrhoid 11/23/2014  . Rectal bleeding 11/23/2014  . Pain in the chest 10/03/2014  . Hemorrhoid 10/03/2014  . Bursitis, trochanteric 07/16/2014  . Chronic infection of sinus 08/10/2013  . Palpitations 07/05/2013    Past Surgical History:  Procedure Laterality Date  . ABDOMINAL HYSTERECTOMY    . AUGMENTATION MAMMAPLASTY Bilateral 2004  . CARPAL TUNNEL RELEASE Right 02/16/2016   Procedure: CARPAL TUNNEL RELEASE;  Surgeon: Dereck Leep, MD;  Location: [facility] ORS;  Service: Orthopedics;  Laterality: Right;  . CARPAL TUNNEL RELEASE Left 04/12/2016   Procedure: CARPAL TUNNEL RELEASE;  Surgeon: Dereck Leep, MD;  Location: [facility] ORS;  Service: Orthopedics;  Laterality: Left;  . COLONOSCOPY  12/29/09   Dr Dionne Milo  . EYE SURGERY Left  shunt placed  . FOOT SURGERY     bilateral   . JOINT REPLACEMENT    . NASAL SEPTUM SURGERY    . REPLACEMENT TOTAL KNEE     bilateral  . SHOULDER SURGERY     right shoulder    Her family history includes Arthritis in her father and mother.      Current Outpatient Prescriptions:  .  apixaban (ELIQUIS) 5 MG TABS tablet, Take 1 tablet (5 mg total) by mouth 2 (two) times daily., Disp: 60 tablet, Rfl: 6 .  calcium carbonate (OS-CAL) 600 MG TABS, Take 600 mg by mouth daily with breakfast. , Disp: , Rfl:  .  celecoxib (CELEBREX) 200 MG capsule, TAKE  1 CAPSULE BY MOUTH ONCE DAILY., Disp: 30 capsule, Rfl: 5 .  cycloSPORINE (RESTASIS) 0.05 % ophthalmic emulsion, Place 1 drop into both eyes 2 (two) times daily. , Disp: , Rfl:  .  digoxin (LANOXIN) 0.125 MG tablet, Take 1 tablet (0.125 mg total) by mouth every morning., Disp: 30 tablet, Rfl: 3 .  dorzolamide-timolol (COSOPT) 22.3-6.8 MG/ML ophthalmic solution, Place 1 drop into the right eye 2 (two) times daily. , Disp: , Rfl:  .  Magnesium 400 MG CAPS, Take 400 mg by mouth 2 (two) times daily., Disp: , Rfl:  .  metoprolol succinate (TOPROL-XL) 25 MG 24 hr tablet, Take 0.5 tablets (12.5 mg total) by mouth daily., Disp: 30 tablet, Rfl: 6 .  Multiple Vitamin (MULTIVITAMIN) tablet, Take 1 tablet by mouth daily., Disp: , Rfl:  .  Omega-3 Fatty Acids (FISH OIL) 1200 MG CAPS, Take 1,200 mg by mouth 2 (two) times daily., Disp: , Rfl:  .  omeprazole (PRILOSEC) 40 MG capsule, Take 40 mg by mouth every morning. , Disp: , Rfl:  .  estrogens, conjugated, (PREMARIN) 0.625 MG tablet, Take 1 tablet (0.625 mg total) by mouth daily. Take daily for 21 days then do not take for 7 days., Disp: 90 tablet, Rfl: 3  Patient Care Team: [name], PA-C as PCP - General (Family Medicine) Wellington Hampshire, MD as Consulting Physician (Cardiology) Robert Bellow, MD (General Surgery)     Objective:   Vitals: BP 128/82 (BP Location: Right Arm, Patient Position: Sitting, Cuff Size: Normal)   Pulse 78   Temp 97.8 F (36.6 C) (Oral)   Resp 16   Ht 5\' 8"  (1.727 m)   Wt 185 lb (83.9 kg)   BMI 28.13 kg/m   Physical Exam  Constitutional: She is oriented to person, place, and time. She appears well-developed and well-nourished. No distress.  HENT:  Head: Normocephalic and atraumatic.  Right Ear: External ear normal.  Left Ear: External ear normal.  Nose: Nose normal.  Mouth/Throat: Oropharynx is clear and moist. No oropharyngeal exudate.  Eyes: Conjunctivae and EOM are normal. Pupils are equal, round, and  reactive to light. Right eye exhibits no discharge. Left eye exhibits no discharge. No scleral icterus.  Neck: Normal range of motion. Neck supple. No JVD present. No tracheal deviation present. No thyromegaly present.  Cardiovascular: Normal rate, regular rhythm, normal heart sounds and intact distal pulses.  Exam reveals no gallop and no friction rub.   No murmur heard. Pulmonary/Chest: Effort normal and breath sounds normal. No respiratory distress. She has no wheezes. She has no rales. She exhibits no tenderness. Right breast exhibits no inverted nipple, no mass, no nipple discharge, no skin change and no tenderness. Left breast exhibits no inverted nipple, no mass, no nipple discharge, no skin  change and no tenderness. Breasts are symmetrical.  Abdominal: Soft. Bowel sounds are normal. She exhibits no distension and no mass. There is no tenderness. There is no rebound and no guarding.  Musculoskeletal: Normal range of motion. She exhibits no edema or tenderness.  Lymphadenopathy:    She has no cervical adenopathy.  Neurological: She is alert and oriented to person, place, and time.  Skin: Skin is warm and dry. No rash noted. She is not diaphoretic.  Psychiatric: She has a normal mood and affect. Her behavior is normal. Judgment and thought content normal.  Vitals reviewed.   Activities of Daily Living In your present state of health, do you have any difficulty performing the following activities: 12/21/2016 02/09/2016  Hearing? N -  Vision? Y -  Difficulty concentrating or making decisions? N -  Walking or climbing stairs? Y -  Dressing or bathing? N -  Doing errands, shopping? N N  Some recent data might be hidden    Fall Risk Assessment Fall Risk  12/21/2016 11/27/2015  Falls in the past year? No No     Depression Screen PHQ 2/9 Scores 12/21/2016 11/27/2015  PHQ - 2 Score 0 0    Cognitive Testing - 6-CIT  Correct? Score   What year is it? yes 0 0 or 4  What month is it? yes 0  0 or 3  Memorize:    Pia Mau,  [birth year],  [place],      What time is it? (within 1 hour) yes 0 0 or 3  Count backwards from 20 yes 0 0, 2, or 4  Name the months of the year no 2 0, 2, or 4  Repeat name & address above no 3 0, 2, 4, 6, 8, or 10       TOTAL SCORE  5/28   Interpretation:  Normal  Normal (0-7) Abnormal (8-28)   Audit-C Alcohol Use Screening  Question Answer Points  How often do you have alcoholic drink? never 0  On days you do drink alcohol, how many drinks do you typically consume? 0 0  How oftey will you drink 6 or more in a total? never 0  Total Score:  0   A score of 3 or more in women, and 4 or more in men indicates increased risk for alcohol abuse, EXCEPT if all of the points are from question 1.     Assessment & Plan:     Annual Wellness Visit  Reviewed patient's Family Medical History Reviewed and updated list of patient's medical providers Assessment of cognitive impairment was done Assessed patient's functional ability Established a written schedule for health screening [place] Completed and Reviewed  Exercise Activities and Dietary recommendations Goals    None      Immunization History  Administered Date(s) Administered  . Influenza, High Dose Seasonal PF 08/26/2016  . Influenza-Unspecified 10/08/2015  . Pneumococcal Conjugate-13 11/11/2014  . Pneumococcal Polysaccharide-23 10/27/2012  . Tdap 12/03/2010    Health Maintenance  Topic Date Due  . MAMMOGRAM  06/03/2018  . COLONOSCOPY  12/30/2019  . TETANUS/TDAP  12/03/2020  . INFLUENZA VACCINE  Completed  . DEXA SCAN  Completed  . ZOSTAVAX  Addressed  . PNA vac Low Risk Adult  Completed     Discussed health benefits of physical activity, and encouraged her to engage in regular exercise appropriate for her age and condition.    1. Medicare annual wellness visit, subsequent Normal physical exam.  2. Essential hypertension Stable. Continue  metoprolol 12.5mg  daily. Will check labs as below and f/u pending results. - CBC w/Diff/Platelet - Comprehensive Metabolic Panel (CMET)  3. Pure hypercholesterolemia Stable. Continue omega-3 1200mg  BID. Will check labs as below and f/u pending results. - Comprehensive Metabolic Panel (CMET) - Lipid Profile  4. Post menopausal syndrome Stable. Diagnosis pulled for medication refill. Continue current medical treatment plan.  5. Hot flashes, menopausal See above medical treatment plan. - estrogens, conjugated, (PREMARIN) 0.625 MG tablet; Take 1 tablet (0.625 mg total) by mouth daily.  Dispense: 90 tablet; Refill: 3  6. Osteopenia, unspecified location Due for repeat BMD screen. Ordered as below and I will f/u pending results. Continue calcium supplement.  - DG Bone Density; Future  7. Postmenopausal estrogen deficiency See above medical treatment plan. - DG Bone Density; Future  ------------------------------------------------------------------------------------------------------------    [name], PA-C  [facility]

## 2016-12-21 NOTE — Patient Instructions (Signed)

## 2016-12-22 LAB — COMPREHENSIVE METABOLIC PANEL
ALT: 24 IU/L (ref 0–32)
AST: 24 IU/L (ref 0–40)
Albumin/Globulin Ratio: 1.2 (ref 1.2–2.2)
Albumin: 3.8 g/dL (ref 3.5–4.8)
Alkaline Phosphatase: 69 IU/L (ref 39–117)
BUN/Creatinine Ratio: 18 (ref 12–28)
BUN: 12 mg/dL (ref 8–27)
Bilirubin Total: 0.6 mg/dL (ref 0.0–1.2)
CO2: 29 mmol/L (ref 18–29)
Calcium: 9.6 mg/dL (ref 8.7–10.3)
Chloride: 101 mmol/L (ref 96–106)
Creatinine, Ser: 0.68 mg/dL (ref 0.57–1.00)
GFR calc Af Amer: 100 mL/min/{1.73_m2} (ref >59)
GFR calc non Af Amer: 86 mL/min/{1.73_m2} (ref >59)
Globulin, Total: 3.2 g/dL (ref 1.5–4.5)
Glucose: 105 mg/dL — ABNORMAL HIGH (ref 65–99)
Potassium: 5.1 mmol/L (ref 3.5–5.2)
Sodium: 141 mmol/L (ref 134–144)
Total Protein: 7 g/dL (ref 6.0–8.5)

## 2016-12-22 LAB — CBC WITH DIFFERENTIAL/PLATELET
Basophils Absolute: 0 10*3/uL (ref 0.0–0.2)
Basos: 1 %
EOS (ABSOLUTE): 0.1 10*3/uL (ref 0.0–0.4)
Eos: 2 %
Hematocrit: 46.4 % (ref 34.0–46.6)
Hemoglobin: 15.5 g/dL (ref 11.1–15.9)
Immature Grans (Abs): 0 10*3/uL (ref 0.0–0.1)
Immature Granulocytes: 0 %
Lymphocytes Absolute: 1.6 10*3/uL (ref 0.7–3.1)
Lymphs: 29 %
MCH: 32.4 pg (ref 26.6–33.0)
MCHC: 33.4 g/dL (ref 31.5–35.7)
MCV: 97 fL (ref 79–97)
Monocytes Absolute: 0.6 10*3/uL (ref 0.1–0.9)
Monocytes: 10 %
Neutrophils Absolute: 3.3 10*3/uL (ref 1.4–7.0)
Neutrophils: 58 %
Platelets: 220 10*3/uL (ref 150–379)
RBC: 4.78 x10E6/uL (ref 3.77–5.28)
RDW: 13.4 % (ref 12.3–15.4)
WBC: 5.6 10*3/uL (ref 3.4–10.8)

## 2016-12-22 LAB — LIPID PANEL
Chol/HDL Ratio: 4 ratio units (ref 0.0–4.4)
Cholesterol, Total: 201 mg/dL — ABNORMAL HIGH (ref 100–199)
HDL: 50 mg/dL (ref >39)
LDL Calculated: 100 mg/dL — ABNORMAL HIGH (ref 0–99)
Triglycerides: 255 mg/dL — ABNORMAL HIGH (ref 0–149)
VLDL Cholesterol Cal: 51 mg/dL — ABNORMAL HIGH (ref 5–40)

## 2016-12-24 NOTE — Progress Notes (Signed)
Advised  ED 

## 2017-01-11 DIAGNOSIS — H401123 Primary open-angle glaucoma, left eye, severe stage: Secondary | ICD-10-CM | POA: Diagnosis not present

## 2017-02-08 ENCOUNTER — Ambulatory Visit
Admission: RE | Admit: 2017-02-08 | Discharge: 2017-02-08 | Disposition: A | Discharge status: Home / Self Care | Payer: PPO | Source: Ambulatory Visit | Attending: Physician Assistant | Admitting: Physician Assistant

## 2017-02-08 DIAGNOSIS — M8580 Other specified disorders of bone density and structure, unspecified site: Secondary | ICD-10-CM | POA: Insufficient documentation

## 2017-02-08 DIAGNOSIS — M85851 Other specified disorders of bone density and structure, right thigh: Secondary | ICD-10-CM | POA: Insufficient documentation

## 2017-02-08 DIAGNOSIS — Z780 Asymptomatic menopausal state: Secondary | ICD-10-CM | POA: Diagnosis not present

## 2017-02-09 ENCOUNTER — Telehealth: Payer: Self-pay

## 2017-02-09 NOTE — Telephone Encounter (Signed)
Teresa Harris left message at work.

## 2017-02-09 NOTE — Telephone Encounter (Signed)
Patient advised as directed below. Per patient she wants you to compare her bone density to the last one she had done. Last one 01/08/16.  She also reports that Vitamin D makes her stomach upset but that she will try again.  Thanks,  -Sayid Moll

## 2017-02-09 NOTE — Telephone Encounter (Signed)
Pt advised of results. Is taking 1200 mg of calcium daily, and vitamin D upsets her stomach. Advised pt to try Vitamin D again to help absorb calcium. Pt agrees with treatment plan. Renaldo Fiddler, CMA

## 2017-02-09 NOTE — Telephone Encounter (Signed)
Bone density has decreased from previous BMD in 2015. This is measured by Tscore. T score currently is -1.8. Previously it had been -0.6 to -0.9, which was normal. You have went from normal bone density to osteopenic. Calcium and vit D are what we use to treat at this point. We will continue to monitor bone density and recheck in 2 years. If it drops below -2.5 that is osteoporotic and will discuss other therapies.

## 2017-02-09 NOTE — Telephone Encounter (Signed)
-----   Message from [name], PA-C sent at 02/08/2017  4:10 PM EST ----- BMD shows osteopenia. Recommended to make sure to be taking OTC calcium + Vit D supplementation. Take a minimum of 1200mg  calcium and 800 mg Vit D.

## 2017-02-10 ENCOUNTER — Other Ambulatory Visit: Payer: Self-pay

## 2017-02-10 DIAGNOSIS — M1990 Unspecified osteoarthritis, unspecified site: Secondary | ICD-10-CM

## 2017-02-10 MED ORDER — CELECOXIB 200 MG PO CAPS: ORAL_CAPSULE | ORAL | 5 refills | Status: DC

## 2017-02-10 NOTE — Telephone Encounter (Signed)
Pharmacy requesting refill Last ov 12/24/16 Last filled 07/20/16 Please review. Thank you. sd

## 2017-03-14 ENCOUNTER — Other Ambulatory Visit: Payer: Self-pay | Admitting: *Deleted

## 2017-03-14 MED ORDER — DIGOXIN 125 MCG PO TABS: 0.1250 mg | ORAL_TABLET | ORAL | 3 refills | Status: DC

## 2017-03-22 DIAGNOSIS — L570 Actinic keratosis: Secondary | ICD-10-CM | POA: Diagnosis not present

## 2017-03-22 DIAGNOSIS — Z872 Personal history of diseases of the skin and subcutaneous tissue: Secondary | ICD-10-CM | POA: Diagnosis not present

## 2017-04-04 DIAGNOSIS — K219 Gastro-esophageal reflux disease without esophagitis: Secondary | ICD-10-CM | POA: Diagnosis not present

## 2017-04-18 ENCOUNTER — Ambulatory Visit (INDEPENDENT_AMBULATORY_CARE_PROVIDER_SITE_OTHER): Payer: PPO | Admitting: Cardiovascular Disease

## 2017-04-18 ENCOUNTER — Encounter: Payer: Self-pay | Admitting: Cardiovascular Disease

## 2017-04-18 VITALS — BP 110/60 | HR 83 | Ht 68.0 in | Wt 184.2 lb

## 2017-04-18 DIAGNOSIS — I482 Chronic atrial fibrillation, unspecified: Secondary | ICD-10-CM

## 2017-04-18 DIAGNOSIS — I10 Essential (primary) hypertension: Secondary | ICD-10-CM | POA: Diagnosis not present

## 2017-04-18 NOTE — Patient Instructions (Signed)
Medication Instructions:  Your physician recommends that you continue on your current medications as directed. Please refer to the Current Medication list given to you today.   Labwork: Digoxin level today  Testing/Procedures: none  Follow-Up: Your physician wants you to follow-up in: 6 months with Dr. Fletcher Anon.  You will receive a reminder letter in the mail two months in advance. If you don't receive a letter, please call our office to schedule the follow-up appointment.   Any Other Special Instructions Will Be Listed Below (If Applicable).     If you need a refill on your cardiac medications before your next appointment, please call your pharmacy.

## 2017-04-18 NOTE — Progress Notes (Signed)
Cardiology Office Note   Date:  04/18/2017   ID:  Teresa Harris, DOB 08/21/1941, MRN [id number]  PCP:  [name], PA-C  Cardiologist:   Kathlyn Sacramento, MD   Chief Complaint  Patient presents with  . other    6 month follow up. Meds reviewed by the pt. verbally. "doing well."       History of Present Illness: Teresa Harris is a 76 y.o. female who presents for a follow-up regarding chronic atrial fibrillation.  She has borderline diabetes and hyperlipidemia not requiring medications. She smoked in the past more than 30 years ago. There is no family history of coronary artery disease or arrhythmia.  She was diagnosed with symptomatic atrial fibrillation in 2015.  She underwent a nuclear stress test in November 2015 which showed no evidence of ischemia with normal ejection fraction. Echocardiogram showed normal EF with mild to moderate mitral regurgitation and mildly dilated left atrium. She complained of fatigue with metoprolol and was switched to diltiazem but that was not effective. She went back on Toprol.  She reports intolerance to medications and does not like taking higher doses.  She has been doing reasonably well with no chest pain or shortness of breath. She has occasional palpitations with activities. She continues to complain of generalized fatigue. No bleeding complications.  Past Medical History:  Diagnosis Date  . Arthritis   . Atrial fibrillation ([place]) 10/01/14  . Complication of anesthesia    trouble waking up with Propofol  . Depression    h/o  . Dysrhythmia    a fib  . Family history of adverse reaction to anesthesia   . GERD (gastroesophageal reflux disease)   . Glaucoma    left eye  . Headache    H/O MIGRAINES  . Hemorrhoids   . Hiatal hernia   . History of migraine headaches   . Hx MRSA infection   . Hypercholesteremia   . Kidney calculi   . Osteopenia   . PONV (postoperative nausea and vomiting)     Past Surgical History:    Procedure Laterality Date  . ABDOMINAL HYSTERECTOMY    . AUGMENTATION MAMMAPLASTY Bilateral 2004  . CARPAL TUNNEL RELEASE Right 02/16/2016   Procedure: CARPAL TUNNEL RELEASE;  Surgeon: Dereck Leep, MD;  Location: [facility] OR[facility];  Service: Orthopedics;  Laterality: Right;  . CARPAL TUNNEL RELEASE Left 04/12/2016   Procedure: CARPAL TUNNEL RELEASE;  Surgeon: Dereck Leep, MD;  Location: [facility] OR[facility];  Service: Orthopedics;  Laterality: Left;  . COLONOSCOPY  12/29/09   Dr Dionne Milo  . EYE SURGERY Left    shunt placed  . FOOT SURGERY     bilateral   . JOINT REPLACEMENT    . NASAL SEPTUM SURGERY    . REPLACEMENT TOTAL KNEE     bilateral  . SHOULDER SURGERY     right shoulder     Current Outpatient Prescriptions  Medication Sig Dispense Refill  . apixaban (ELIQUIS) 5 MG TABS tablet Take 1 tablet (5 mg total) by mouth 2 (two) times daily. 60 tablet 6  . calcium carbonate (OS-CAL) 600 MG TABS Take 600 mg by mouth daily with breakfast.     . celecoxib (CELEBREX) 200 MG capsule TAKE 1 CAPSULE BY MOUTH ONCE DAILY. 30 capsule 5  . cycloSPORINE (RESTASIS) 0.05 % ophthalmic emulsion Place 1 drop into both eyes 2 (two) times daily.     . digoxin (LANOXIN) 0.125 MG tablet Take 1 tablet (0.125 mg total) by  mouth every morning. 30 tablet 3  . dorzolamide-timolol (COSOPT) 22.3-6.8 MG/ML ophthalmic solution Place 1 drop into the right eye 2 (two) times daily.     Marland Kitchen estrogens, conjugated, (PREMARIN) 0.625 MG tablet Take 1 tablet (0.625 mg total) by mouth daily. 90 tablet 3  . Magnesium 400 MG CAPS Take 400 mg by mouth 2 (two) times daily.    . metoprolol succinate (TOPROL-XL) 25 MG 24 hr tablet Take 0.5 tablets (12.5 mg total) by mouth daily. 30 tablet 6  . Multiple Vitamin (MULTIVITAMIN) tablet Take 1 tablet by mouth daily.    . Omega-3 Fatty Acids (FISH OIL) 1200 MG CAPS Take 1,200 mg by mouth 2 (two) times daily.    Marland Kitchen omeprazole (PRILOSEC) 40 MG capsule Take 40 mg by mouth every morning.      No current  facility-administered medications for this visit.     Allergies:   [name] [moxifloxacin hcl in nacl]; Bimatoprost; Brimonidine; Keflex [cephalexin]; Mobic [meloxicam]; Moxifloxacin; Propofol; Travoprost; and Vicodin [hydrocodone-acetaminophen]    Social History:  The patient  reports that she quit smoking about 21 years ago. Her smoking use included Cigarettes. She has a 30.00 pack-year smoking history. She has never used smokeless tobacco. She reports that she does not drink alcohol or use drugs.   Family History:  The patient's family history includes Arthritis in her father and mother.    ROS:  Please see the history of present illness.   Otherwise, review of systems are positive for none.   All other systems are reviewed and negative.    PHYSICAL EXAM: VS:  BP 110/60 (BP Location: Left Arm, Patient Position: Sitting, Cuff Size: Normal)   Pulse 83   Ht 5\' 8"  (1.727 m)   Wt 184 lb 4 oz (83.6 kg)   BMI 28.02 kg/m  , BMI Body mass index is 28.02 kg/m. GEN: Well nourished, well developed, in no acute distress  HEENT: normal  Neck: no JVD, carotid bruits, or masses Cardiac: Irregularly irregular ; no murmurs, rubs, or gallops,no edema  Respiratory:  clear to auscultation bilaterally, normal work of breathing GI: soft, nontender, nondistended, + BS MS: no deformity or atrophy  Skin: warm and dry, no rash Neuro:  Strength and sensation are intact Psych: euthymic mood, full affect   EKG:  EKG is ordered today. The ekg ordered today demonstrates atrial fibrillation with low voltage and nonspecific ST changes likely due to digoxin effect..    Recent Labs: 12/21/2016: ALT 24; BUN 12; Creatinine, Ser 0.68; Platelets 220; Potassium 5.1; Sodium 141    Lipid Panel    Component Value Date/Time   CHOL 201 (H) 12/21/2016 1017   TRIG 255 (H) 12/21/2016 1017   HDL 50 12/21/2016 1017   CHOLHDL 4.0 12/21/2016 1017   LDLCALC 100 (H) 12/21/2016 1017      Wt Readings from Last 3  Encounters:  04/18/17 184 lb 4 oz (83.6 kg)  12/21/16 185 lb (83.9 kg)  10/11/16 185 lb 12.8 oz (84.3 kg)       ASSESSMENT AND PLAN:  Chronic atrial fibrillation: Ventricular rate is reasonably controlled with small dose digoxin and metoprolol. She is tolerating anticoagulation with no side effects.  I reviewed her labs in January which overall were unremarkable and showed normal renal function and CBC. I requested digoxin level today.  Disposition:   FU with me in 6 months  Signed,  Kathlyn Sacramento, MD  04/18/2017 1:48 PM    [place]

## 2017-04-19 LAB — DIGOXIN LEVEL: Digoxin, Serum: 0.6 ng/mL (ref 0.5–0.9)

## 2017-05-23 ENCOUNTER — Other Ambulatory Visit: Payer: Self-pay | Admitting: Physician Assistant

## 2017-05-23 DIAGNOSIS — Z1231 Encounter for screening mammogram for malignant neoplasm of breast: Secondary | ICD-10-CM

## 2017-06-10 DIAGNOSIS — M5030 Other cervical disc degeneration, unspecified cervical region: Secondary | ICD-10-CM | POA: Diagnosis not present

## 2017-06-10 DIAGNOSIS — M25511 Pain in right shoulder: Secondary | ICD-10-CM | POA: Diagnosis not present

## 2017-06-21 ENCOUNTER — Ambulatory Visit
Admission: RE | Admit: 2017-06-21 | Discharge: 2017-06-21 | Disposition: A | Discharge status: Home / Self Care | Payer: PPO | Source: Ambulatory Visit | Attending: Physician Assistant | Admitting: Physician Assistant

## 2017-06-21 DIAGNOSIS — Z1231 Encounter for screening mammogram for malignant neoplasm of breast: Secondary | ICD-10-CM

## 2017-06-22 ENCOUNTER — Telehealth: Payer: Self-pay

## 2017-06-22 DIAGNOSIS — L218 Other seborrheic dermatitis: Secondary | ICD-10-CM | POA: Diagnosis not present

## 2017-06-22 DIAGNOSIS — L570 Actinic keratosis: Secondary | ICD-10-CM | POA: Diagnosis not present

## 2017-06-22 NOTE — Telephone Encounter (Signed)
-----   Message from [name], PA-C sent at 06/21/2017  1:39 PM EDT ----- Normal mammogram. Repeat screening in one year.

## 2017-06-22 NOTE — Telephone Encounter (Signed)
Patient advised as directed below.  Thanks,  -Joseline 

## 2017-07-12 DIAGNOSIS — H401123 Primary open-angle glaucoma, left eye, severe stage: Secondary | ICD-10-CM | POA: Diagnosis not present

## 2017-07-26 DIAGNOSIS — H401123 Primary open-angle glaucoma, left eye, severe stage: Secondary | ICD-10-CM | POA: Diagnosis not present

## 2017-08-15 ENCOUNTER — Other Ambulatory Visit: Payer: Self-pay | Admitting: *Deleted

## 2017-08-15 ENCOUNTER — Telehealth: Payer: Self-pay | Admitting: Physician Assistant

## 2017-08-15 DIAGNOSIS — N951 Menopausal and female climacteric states: Secondary | ICD-10-CM

## 2017-08-15 MED ORDER — ESTROGENS CONJUGATED 0.625 MG PO TABS: 0.6250 mg | ORAL_TABLET | Freq: Every day | ORAL | 3 refills | Status: DC

## 2017-08-15 MED ORDER — APIXABAN 5 MG PO TABS: 5.0000 mg | ORAL_TABLET | Freq: Two times a day (BID) | ORAL | 2 refills | Status: DC

## 2017-08-15 MED ORDER — DIGOXIN 125 MCG PO TABS: 0.1250 mg | ORAL_TABLET | ORAL | 2 refills | Status: DC

## 2017-08-15 NOTE — Telephone Encounter (Signed)
Refill sent.

## 2017-08-15 NOTE — Telephone Encounter (Signed)
[facility] faxed a refill request on the following medications:  estrogens, conjugated, (PREMARIN) 0.625 MG tablet.  Take one tablet by mouth once a day for 21 days then do not take for 7 days.  90 day supply.  [facility]

## 2017-08-18 ENCOUNTER — Telehealth: Payer: Self-pay | Admitting: Physician Assistant

## 2017-08-18 DIAGNOSIS — N951 Menopausal and female climacteric states: Secondary | ICD-10-CM

## 2017-08-18 MED ORDER — ESTROGENS CONJUGATED 0.625 MG PO TABS: 0.6250 mg | ORAL_TABLET | Freq: Every day | ORAL | 3 refills | Status: DC

## 2017-08-18 NOTE — Telephone Encounter (Signed)
Pharmacy calling for her Estrogens premarin 0.625  [facility]  2 request  Thanks teri

## 2017-08-18 NOTE — Telephone Encounter (Signed)
Sent in

## 2017-08-18 NOTE — Telephone Encounter (Signed)
I re send it to [facility] to change it to e-prescribed instead of no print.

## 2017-08-18 NOTE — Telephone Encounter (Signed)
[facility] faxed a request on the following medication. I seen were you had started it, but I wasn't sure if it was sent to pharmacy. Thanks CC  estrogens, conjugated, (PREMARIN) 0.625 MG tablet  >Take 1 tablet by mouth once daily for 21 days then do not take for 7 days.

## 2017-08-25 DIAGNOSIS — Z86018 Personal history of other benign neoplasm: Secondary | ICD-10-CM | POA: Diagnosis not present

## 2017-08-25 DIAGNOSIS — L570 Actinic keratosis: Secondary | ICD-10-CM | POA: Diagnosis not present

## 2017-08-25 DIAGNOSIS — L718 Other rosacea: Secondary | ICD-10-CM | POA: Diagnosis not present

## 2017-09-06 DIAGNOSIS — M5030 Other cervical disc degeneration, unspecified cervical region: Secondary | ICD-10-CM | POA: Diagnosis not present

## 2017-09-07 ENCOUNTER — Other Ambulatory Visit: Payer: Self-pay | Admitting: Physician Assistant

## 2017-09-07 ENCOUNTER — Ambulatory Visit (INDEPENDENT_AMBULATORY_CARE_PROVIDER_SITE_OTHER): Payer: PPO | Admitting: Physician Assistant

## 2017-09-07 ENCOUNTER — Encounter: Payer: Self-pay | Admitting: Physician Assistant

## 2017-09-07 VITALS — BP 110/82 | HR 60 | Temp 98.1°F | Wt 184.6 lb

## 2017-09-07 DIAGNOSIS — Z23 Encounter for immunization: Secondary | ICD-10-CM | POA: Diagnosis not present

## 2017-09-07 DIAGNOSIS — M5030 Other cervical disc degeneration, unspecified cervical region: Secondary | ICD-10-CM

## 2017-09-07 DIAGNOSIS — F5101 Primary insomnia: Secondary | ICD-10-CM

## 2017-09-07 MED ORDER — TEMAZEPAM 15 MG PO CAPS: 15.0000 mg | ORAL_CAPSULE | Freq: Every evening | ORAL | 0 refills | Status: DC | PRN

## 2017-09-07 NOTE — Progress Notes (Signed)
Patient: Teresa Harris Female    DOB: 1941-02-17   76 y.o.   MRN: [id number] Visit Date: 09/08/2017  Today's Provider: [name], PA-C   Chief Complaint  Patient presents with  . Insomnia   Subjective:    Insomnia  Primary symptoms: sleep disturbance.  Episode onset: 2 months. The onset quality is sudden. The problem occurs nightly. The problem has been gradually worsening since onset. The symptoms are aggravated by pain (recent retirement and neck pain. Will be having surgery soon). Treatments tried: Ambien she got from a friend  The treatment provided significant relief. How long after going to bed to you fall asleep: other (Patient states she is not able to go to bed for 3-4 days at a time).   Sleep duration: none.  PMH includes: none. Prior diagnostic workup includes:  No prior workup.     Previous Medications   APIXABAN (ELIQUIS) 5 MG TABS TABLET    Take 1 tablet (5 mg total) by mouth 2 (two) times daily.   CALCIUM CARBONATE (OS-CAL) 600 MG TABS    Take 600 mg by mouth daily with breakfast.    CELECOXIB (CELEBREX) 200 MG CAPSULE    TAKE 1 CAPSULE BY MOUTH ONCE DAILY.   CYCLOSPORINE (RESTASIS) 0.05 % OPHTHALMIC EMULSION    Place 1 drop into both eyes 2 (two) times daily.    DIGOXIN (LANOXIN) 0.125 MG TABLET    Take 1 tablet (0.125 mg total) by mouth every morning.   DORZOLAMIDE-TIMOLOL (COSOPT) 22.3-6.8 MG/ML OPHTHALMIC SOLUTION    Place 1 drop into the right eye 2 (two) times daily.    ESTROGENS, CONJUGATED, (PREMARIN) 0.625 MG TABLET    Take 1 tablet (0.625 mg total) by mouth daily.   MAGNESIUM 400 MG CAPS    Take 400 mg by mouth 2 (two) times daily.   METOPROLOL SUCCINATE (TOPROL-XL) 25 MG 24 HR TABLET    Take 0.5 tablets (12.5 mg total) by mouth daily.   MULTIPLE VITAMIN (MULTIVITAMIN) TABLET    Take 1 tablet by mouth daily.   OMEGA-3 FATTY ACIDS (FISH OIL) 1200 MG CAPS    Take 1,200 mg by mouth 2 (two) times daily.   OMEPRAZOLE (PRILOSEC) 40 MG CAPSULE    Take 40  mg by mouth every morning.     Review of Systems  Constitutional: Negative.   Respiratory: Negative.   Cardiovascular: Negative.   Psychiatric/Behavioral: Positive for sleep disturbance. Negative for agitation, decreased concentration, dysphoric mood, hallucinations, self-injury and suicidal ideas. The patient has insomnia. The patient is not nervous/anxious and is not hyperactive.     Social History  Substance Use Topics  . Smoking status: Former Smoker    Packs/day: 1.00    Years: 30.00    Types: Cigarettes    Quit date: 02/09/1996  . Smokeless tobacco: Never Used     Comment: Quit in 2000  . Alcohol use No   Objective:   BP 110/82 (BP Location: Right Arm, Patient Position: Sitting, Cuff Size: Normal)   Pulse 60   Temp 98.1 F (36.7 C) (Oral)   Wt 184 lb 9.6 oz (83.7 kg)   SpO2 97%   BMI 28.07 kg/m   Physical Exam  Constitutional: She appears well-developed and well-nourished. No distress.  Neck: Normal range of motion. Neck supple. No JVD present. No tracheal deviation present. No thyromegaly present.  Cardiovascular: Normal rate, regular rhythm and normal heart sounds.  Exam reveals no gallop and no friction rub.   No  murmur heard. Pulmonary/Chest: Effort normal and breath sounds normal. No respiratory distress. She has no wheezes. She has no rales.  Lymphadenopathy:    She has no cervical adenopathy.  Skin: She is not diaphoretic.  Psychiatric: She has a normal mood and affect. Her behavior is normal. Judgment and thought content normal.  Vitals reviewed.      Assessment & Plan:     1. Primary insomnia Will try temazepam as below. I will see her back in 4 weeks to see how she is tolerating medication and if needs to be continued. She is to call if she has adverse reactions or acute issue in the meantime.  - temazepam (RESTORIL) 15 MG capsule; Take 1 capsule (15 mg total) by mouth at bedtime as needed for sleep.  Dispense: 30 capsule; Refill: 0  2. Need for  influenza vaccination Flu vaccine given today without complication. Patient sat upright for 15 minutes to check for adverse reaction before being released. - Flu vaccine HIGH DOSE PF  Follow up: Return in about 4 weeks (around 10/05/2017) for insomnia.

## 2017-09-07 NOTE — Patient Instructions (Signed)
Temazepam tablets or capsules What is this medicine? TEMAZEPAM (te MAZ e pam) is a benzodiazepine. It is used to help you to fall asleep and sleep through the night. This medicine may be used for other purposes; ask your health care provider or pharmacist if you have questions. COMMON BRAND NAME(S): Restoril What should I tell my health care provider before I take this medicine? They need to know if you have any of these conditions: -an alcohol or drug abuse problem -bipolar disorder, depression, psychosis or other mental health condition -kidney disease -liver disease -lung or breathing disease -myasthenia gravis -Parkinson's disease -porphyria -seizures or a history of seizures -suicidal thoughts -an unusual or allergic reaction to temazepam, other benzodiazepines, other medicines, foods, dyes, or preservatives -pregnant or trying to get pregnant -breast-feeding How should I use this medicine? Take this medicine by mouth. It is only for use at bedtime. Follow the directions on the prescription label. Swallow the tablets or capsules with a drink of water. If it upsets your stomach, take it with food or milk. Do not take your medicine more often than directed. Do not stop taking except on the advice of your doctor or health care professional. A special MedGuide will be given to you by the pharmacist with each prescription and refill. Be sure to read this information carefully each time. Talk to your pediatrician regarding the use of this medicine in children. Special care may be needed. Overdosage: If you think you have taken too much of this medicine contact a poison control center or emergency room at once. NOTE: This medicine is only for you. Do not share this medicine with others. What if I miss a dose? If you miss a dose, take it as soon as you can. If it is almost time for your next dose, take only that dose. Do not take double or extra doses. What may interact with this  medicine? Do not take this medicine with any of the following medications: -narcotic medicines for cough -sodium oxybate This medicine may also interact with the following medications: -alcohol -antihistamines for allergy, cough and cold -certain medicines for anxiety or sleep -certain medicines for depression, like amitriptyline, fluoxetine, sertraline -certain medicines for seizures like phenobarbital, primidone -general anesthetics like lidocaine, pramoxine, tetracaine -medicines that relax muscles for surgery -narcotic medicines for pain -phenothiazines like chlorpromazine, mesoridazine, prochlorperazine, thioridazine This list may not describe all possible interactions. Give your health care provider a list of all the medicines, herbs, non-prescription drugs, or dietary supplements you use. Also tell them if you smoke, drink alcohol, or use illegal drugs. Some items may interact with your medicine. What should I watch for while using this medicine? Tell your doctor or health care professional if your symptoms do not start to get better or if they get worse. Do not stop taking except on your doctor's advice. You may develop a severe reaction. Your doctor will tell you how much medicine to take. You may get drowsy or dizzy. Do not drive, use machinery, or do anything that needs mental alertness until you know how this medicine affects you. Do not stand or sit up quickly, especially if you are an older patient. This reduces the risk of dizzy or fainting spells. Alcohol may interfere with the effect of this medicine. Avoid alcoholic drinks. If you are taking another medicine that also causes drowsiness, you may have more side effects. Give your health care provider a list of all medicines you use. Your doctor will tell you how  much medicine to take. Do not take more medicine than directed. Call emergency for help if you have problems breathing or unusual sleepiness. After taking this medicine  for sleep, you may get up out of bed while not being fully awake and do an activity that you do not know you are doing. The next morning, you may have no memory of the event. Activities such as driving a car ("sleep-driving"), making and eating food, talking on the phone, sexual activity, and sleep-walking have been reported. Call your doctor right away if you find out you have done any of these activities. Do not take this medicine if you have used alcohol that evening or before bed or taken another medicine for sleep since your risk of doing these sleep-related activities will be increased. Do not take this medicine unless you are able to stay in bed for a full night (7 to 8 hour) before you must be active again. You may have a decrease in mental alertness the day after use, even if you feel that you are fully awake. Tell your doctor if you will need to perform activities requiring full alertness, such as driving, the next day. Do not stand or sit up quickly after taking this medicine, especially if you are an older patient. This reduces the risk of dizzy or fainting spells. If you or your family notice any changes in your behavior, such as new or worsening depression, thoughts of harming yourself, anxiety, other unusual or disturbing thoughts, or memory loss, call your doctor right away. After you stop taking this medicine, you may have trouble falling asleep. This is called rebound insomnia. This problem usually goes away on its own after 1 or 2 nights. Women should inform their doctor if they wish to become pregnant or think they might be pregnant. There is a potential for serious side effects to an unborn child. Talk to your health care professional or pharmacist for more information. What side effects may I notice from receiving this medicine? Side effects that you should report to your doctor or health care professional as soon as possible: -allergic reactions like skin rash, itching or hives,  swelling of the face, lips, or tongue -breathing problems -confusion -loss of balance or coordination -signs and symptoms of low blood pressure like dizziness; feeling faint or lightheaded, falls; unusually weak or tired -suicidal thoughts or other mood changes -unusual activities while asleep like driving, eating, making phone calls Side effects that usually do not require medical attention (report to your doctor or health care professional if they continue or are bothersome): -diarrhea -dizziness -nausea, vomiting -tiredness This list may not describe all possible side effects. Call your doctor for medical advice about side effects. You may report side effects to FDA at 1-800-FDA-1088. Where should I keep my medicine? Keep out of the reach of children. This medicine can be abused. Keep your medicine in a safe place to protect it from theft. Do not share this medicine with anyone. Selling or giving away this medicine is dangerous and against the law. This medicine may cause accidental overdose and death if taken by other adults, children, or pets. Mix any unused medicine with a substance like cat litter or coffee grounds. Then throw the medicine away in a sealed container like a sealed bag or a coffee can with a lid. Do not use the medicine after the expiration date. Store at room temperature below 30 degrees C (86 degrees F). Protect from light. Keep container tightly closed. NOTE:  This sheet is a summary. It may not cover all possible information. If you have questions about this medicine, talk to your doctor, pharmacist, or health care provider.  2018 Elsevier/Gold Standard (2015-08-21 23:44:07) Insomnia Insomnia is a sleep disorder that makes it difficult to fall asleep or to stay asleep. Insomnia can cause tiredness (fatigue), low energy, difficulty concentrating, mood swings, and poor performance at work or school. There are three different ways to classify insomnia:  Difficulty  falling asleep.  Difficulty staying asleep.  Waking up too early in the morning.  Any type of insomnia can be long-term (chronic) or short-term (acute). Both are common. Short-term insomnia usually lasts for three months or less. Chronic insomnia occurs at least three times a week for longer than three months. What are the causes? Insomnia may be caused by another condition, situation, or substance, such as:  Anxiety.  Certain medicines.  Gastroesophageal reflux disease (GERD) or other gastrointestinal conditions.  Asthma or other breathing conditions.  Restless legs syndrome, sleep apnea, or other sleep disorders.  Chronic pain.  Menopause. This may include hot flashes.  Stroke.  Abuse of alcohol, tobacco, or illegal drugs.  Depression.  Caffeine.  Neurological disorders, such as Alzheimer disease.  An overactive thyroid (hyperthyroidism).  The cause of insomnia may not be known. What increases the risk? Risk factors for insomnia include:  Gender. Women are more commonly affected than men.  Age. Insomnia is more common as you get older.  Stress. This may involve your professional or personal life.  Income. Insomnia is more common in people with lower income.  Lack of exercise.  Irregular work schedule or night shifts.  Traveling between different time zones.  What are the signs or symptoms? If you have insomnia, trouble falling asleep or trouble staying asleep is the main symptom. This may lead to other symptoms, such as:  Feeling fatigued.  Feeling nervous about going to sleep.  Not feeling rested in the morning.  Having trouble concentrating.  Feeling irritable, anxious, or depressed.  How is this treated? Treatment for insomnia depends on the cause. If your insomnia is caused by an underlying condition, treatment will focus on addressing the condition. Treatment may also include:  Medicines to help you sleep.  Counseling or  therapy.  Lifestyle adjustments.  Follow these instructions at home:  Take medicines only as directed by your health care provider.  Keep regular sleeping and waking hours. Avoid naps.  Keep a sleep diary to help you and your health care provider figure out what could be causing your insomnia. Include: ? When you sleep. ? When you wake up during the night. ? How well you sleep. ? How rested you feel the next day. ? Any side effects of medicines you are taking. ? What you eat and drink.  Make your bedroom a comfortable place where it is easy to fall asleep: ? Put up shades or special blackout curtains to block light from outside. ? Use a white noise machine to block noise. ? Keep the temperature cool.  Exercise regularly as directed by your health care provider. Avoid exercising right before bedtime.  Use relaxation techniques to manage stress. Ask your health care provider to suggest some techniques that may work well for you. These may include: ? Breathing exercises. ? Routines to release muscle tension. ? Visualizing peaceful scenes.  Cut back on alcohol, caffeinated beverages, and cigarettes, especially close to bedtime. These can disrupt your sleep.  Do not overeat or eat spicy foods  right before bedtime. This can lead to digestive discomfort that can make it hard for you to sleep.  Limit screen use before bedtime. This includes: ? Watching TV. ? Using your smartphone, tablet, and computer.  Stick to a routine. This can help you fall asleep faster. Try to do a quiet activity, brush your teeth, and go to bed at the same time each night.  Get out of bed if you are still awake after 15 minutes of trying to sleep. Keep the lights down, but try reading or doing a quiet activity. When you feel sleepy, go back to bed.  Make sure that you drive carefully. Avoid driving if you feel very sleepy.  Keep all follow-up appointments as directed by your health care provider. This is  important. Contact a health care provider if:  You are tired throughout the day or have trouble in your daily routine due to sleepiness.  You continue to have sleep problems or your sleep problems get worse. Get help right away if:  You have serious thoughts about hurting yourself or someone else. This information is not intended to replace advice given to you by your health care provider. Make sure you discuss any questions you have with your health care provider. Document Released: 11/19/2000 Document Revised: 04/23/2016 Document Reviewed: 08/23/2014 Elsevier Interactive Patient Education  [employer].

## 2017-09-12 ENCOUNTER — Other Ambulatory Visit: Payer: Self-pay | Admitting: Physician Assistant

## 2017-09-12 DIAGNOSIS — M1990 Unspecified osteoarthritis, unspecified site: Secondary | ICD-10-CM

## 2017-09-12 MED ORDER — CELECOXIB 200 MG PO CAPS: ORAL_CAPSULE | ORAL | 5 refills | Status: DC

## 2017-09-12 NOTE — Telephone Encounter (Signed)
[facility] faxed a request on the following medication. Thanks CC  celecoxib (CELEBREX) 200 MG capsule  >Take 1 capsule by mouth once daily.

## 2017-09-14 ENCOUNTER — Telehealth: Payer: Self-pay | Admitting: Physician Assistant

## 2017-09-14 DIAGNOSIS — F5101 Primary insomnia: Secondary | ICD-10-CM

## 2017-09-14 MED ORDER — ZOLPIDEM TARTRATE 5 MG PO TABS: 5.0000 mg | ORAL_TABLET | Freq: Every evening | ORAL | 0 refills | Status: DC | PRN

## 2017-09-14 NOTE — Telephone Encounter (Signed)
Please call in Zolpidem 5mg  Take one tab PO q hs prn sleep #30 NR

## 2017-09-14 NOTE — Telephone Encounter (Signed)
Pt stated that she has tried the temazepam (RESTORIL) 15 MG capsule and it hasn't helped her with sleeping and the next day after she takes she just feels bad. Pt is requesting to try something else or a call back to discuss what she should try to help her sleep. [facility]. Please advise. Thanks TNP

## 2017-09-15 ENCOUNTER — Ambulatory Visit: Admission: RE | Admit: 2017-09-15 | Payer: PPO | Source: Ambulatory Visit

## 2017-09-15 NOTE — Telephone Encounter (Signed)
Prescription for Zolpidem was called in to [facility].  Thanks,  -Nely Dedmon

## 2017-09-19 DIAGNOSIS — J019 Acute sinusitis, unspecified: Secondary | ICD-10-CM | POA: Diagnosis not present

## 2017-09-19 DIAGNOSIS — R05 Cough: Secondary | ICD-10-CM | POA: Diagnosis not present

## 2017-09-22 ENCOUNTER — Telehealth: Payer: Self-pay | Admitting: Physician Assistant

## 2017-09-22 NOTE — Telephone Encounter (Signed)
See below-Anastasiya V Hopkins, RMA  

## 2017-09-22 NOTE — Telephone Encounter (Signed)
[facility] faxed approval notice for medication coverage of zolpidem (AMBIEN) 5 MG tablet. Fax has been placed in pt's chart. Please advise. Thanks TNP

## 2017-09-29 ENCOUNTER — Ambulatory Visit
Admission: RE | Admit: 2017-09-29 | Discharge: 2017-09-29 | Disposition: A | Discharge status: Home / Self Care | Payer: PPO | Source: Ambulatory Visit | Attending: Physician Assistant | Admitting: Physician Assistant

## 2017-09-29 DIAGNOSIS — M4802 Spinal stenosis, cervical region: Secondary | ICD-10-CM | POA: Diagnosis not present

## 2017-09-29 DIAGNOSIS — M5030 Other cervical disc degeneration, unspecified cervical region: Secondary | ICD-10-CM

## 2017-10-05 ENCOUNTER — Ambulatory Visit (INDEPENDENT_AMBULATORY_CARE_PROVIDER_SITE_OTHER): Payer: PPO | Admitting: Physician Assistant

## 2017-10-05 ENCOUNTER — Encounter: Payer: Self-pay | Admitting: Physician Assistant

## 2017-10-05 VITALS — BP 122/80 | HR 100 | Temp 97.9°F | Resp 16 | Wt 181.8 lb

## 2017-10-05 DIAGNOSIS — F5101 Primary insomnia: Secondary | ICD-10-CM | POA: Diagnosis not present

## 2017-10-05 MED ORDER — ZOLPIDEM TARTRATE 10 MG PO TABS: 10.0000 mg | ORAL_TABLET | Freq: Every evening | ORAL | 0 refills | Status: DC | PRN

## 2017-10-05 NOTE — Progress Notes (Signed)
Patient: Teresa Harris Female    DOB: 10-13-1941   76 y.o.   MRN: [id number] Visit Date: 10/05/2017  Today's Provider: [name], PA-C   Chief Complaint  Patient presents with  . Follow-up    Insomnia   Subjective:    HPI  Insomnia  She presents today for 4 weeks follow-up of insomnia. Onset was 3 months ago. Insomnia is getting unchanged. She has trouble sleeping at night.If she goes to sleep she only sleeps 3-4 hours. She has difficulty FALLING asleep. She has difficulty STAYING asleep. She does wake frequently to urinate. She is having pain when trying to sleep on her neck and shoulders.  She is not having anxiety. She is not having a lot of stress in her life. . She is not having depression.  She is not taking OTC sleeping aid. . She is taking medications to help sleep. Patient was started on Temazepam on 10/3 but on 10/10 patient called stated that she has tried the temazepam (RESTORIL) 15 MG capsule and it hasn't helped her with sleeping and the next day after she takes she just feels bad. Pt requested to try something else or a call back to discuss what she should try to help her sleep. Zolpidem 5mg  was called in.  She is drinking alcohol to help sleep.She reports that she did drink two nights in a row to help her sleep two weeks ago. No more after that. She reports she almost did last night. States " I don't need to drink with the A-fib, but I feel I need to try anything to help me sleep" She is not using illicit drugs. --------------------------------------------------------------------      Allergies  Allergen Reactions  . [name] [Moxifloxacin Hcl In Nacl]   . Bimatoprost     Other reaction(s): Other (See Comments) Made my eyes red, and hurt  . Brimonidine     Other reaction(s): Other (See Comments) Made my eyes red, and hurt  . Keflex [Cephalexin]     headache  . Mobic [Meloxicam] Other (See Comments)    headache  . Moxifloxacin       Other reaction(s): Headache  . Propofol     Other reaction(s): Other (See Comments) Had difficulty waking up  . Travoprost   . Vicodin [Hydrocodone-Acetaminophen] Nausea And Vomiting    Sick on stomach Sick on stomach     Current Outpatient Prescriptions:  .  apixaban (ELIQUIS) 5 MG TABS tablet, Take 1 tablet (5 mg total) by mouth 2 (two) times daily., Disp: 60 tablet, Rfl: 2 .  calcium carbonate (OS-CAL) 600 MG TABS, Take 600 mg by mouth daily with breakfast. , Disp: , Rfl:  .  celecoxib (CELEBREX) 200 MG capsule, TAKE 1 CAPSULE BY MOUTH ONCE DAILY., Disp: 30 capsule, Rfl: 5 .  cycloSPORINE (RESTASIS) 0.05 % ophthalmic emulsion, Place 1 drop into both eyes 2 (two) times daily. , Disp: , Rfl:  .  digoxin (LANOXIN) 0.125 MG tablet, Take 1 tablet (0.125 mg total) by mouth every morning., Disp: 30 tablet, Rfl: 2 .  dorzolamide-timolol (COSOPT) 22.3-6.8 MG/ML ophthalmic solution, Place 1 drop into the right eye 2 (two) times daily. , Disp: , Rfl:  .  estrogens, conjugated, (PREMARIN) 0.625 MG tablet, Take 1 tablet (0.625 mg total) by mouth daily., Disp: 90 tablet, Rfl: 3 .  Magnesium 400 MG CAPS, Take 400 mg by mouth 2 (two) times daily., Disp: , Rfl:  .  metoprolol succinate (TOPROL-XL) 25  MG 24 hr tablet, Take 0.5 tablets (12.5 mg total) by mouth daily., Disp: 30 tablet, Rfl: 6 .  Multiple Vitamin (MULTIVITAMIN) tablet, Take 1 tablet by mouth daily., Disp: , Rfl:  .  Omega-3 Fatty Acids (FISH OIL) 1200 MG CAPS, Take 1,200 mg by mouth 2 (two) times daily., Disp: , Rfl:  .  omeprazole (PRILOSEC) 40 MG capsule, Take 40 mg by mouth every morning. , Disp: , Rfl:  .  zolpidem (AMBIEN) 5 MG tablet, Take 1 tablet (5 mg total) by mouth at bedtime as needed for sleep., Disp: 30 tablet, Rfl: 0  Review of Systems  Constitutional: Positive for fatigue.  Cardiovascular: Positive for palpitations (A-Fib). Negative for chest pain and leg swelling.  Psychiatric/Behavioral: Positive for sleep  disturbance. Negative for decreased concentration and dysphoric mood. The patient is not nervous/anxious.     Social History  Substance Use Topics  . Smoking status: Former Smoker    Packs/day: 1.00    Years: 30.00    Types: Cigarettes    Quit date: 02/09/1996  . Smokeless tobacco: Never Used     Comment: Quit in 2000  . Alcohol use No   Objective:   BP 122/80 (BP Location: Right Arm, Patient Position: Sitting, Cuff Size: Normal)   Pulse 100   Temp 97.9 F (36.6 C) (Oral)   Resp 16   Wt 181 lb 12.8 oz (82.5 kg)   BMI 27.64 kg/m    Physical Exam  Constitutional: She appears well-developed and well-nourished. No distress.  Neck: Normal range of motion. Neck supple. No JVD present. No tracheal deviation present. No thyromegaly present.  Cardiovascular: Normal rate and normal heart sounds.  An irregularly irregular rhythm present. Exam reveals no gallop and no friction rub.   No murmur heard. Pulmonary/Chest: Effort normal and breath sounds normal. No respiratory distress. She has no wheezes. She has no rales.  Lymphadenopathy:    She has no cervical adenopathy.  Skin: She is not diaphoretic.  Vitals reviewed.      Assessment & Plan:     1. Primary insomnia No improvements. Will increase Ambien to 10mg  as below. She can take 2 tabs of what she has remaining of the 5mg  dose. Discussed importance of not drinking alcohol with Ambien. She voiced understanding. She is to call by Monday, 10/10/17, if no improvements and I will change therapy.  - zolpidem (AMBIEN) 10 MG tablet; Take 1 tablet (10 mg total) by mouth at bedtime as needed for sleep.  Dispense: 30 tablet; Refill: 0       [name], PA-C  [facility]

## 2017-10-05 NOTE — Patient Instructions (Signed)
Zolpidem tablets What is this medicine? ZOLPIDEM (zole PI dem) is used to treat insomnia. This medicine helps you to fall asleep and sleep through the night. This medicine may be used for other purposes; ask your health care provider or pharmacist if you have questions. COMMON BRAND NAME(S): Ambien What should I tell my health care provider before I take this medicine? They need to know if you have any of these conditions: -depression -history of drug abuse or addiction -if you often drink alcohol -liver disease -lung or breathing disease -myasthenia gravis -sleep apnea -suicidal thoughts, plans, or attempt; a previous suicide attempt by you or a family member -an unusual or allergic reaction to zolpidem, other medicines, foods, dyes, or preservatives -pregnant or trying to get pregnant -breast-feeding How should I use this medicine? Take this medicine by mouth with a glass of water. Follow the directions on the prescription label. It is better to take this medicine on an empty stomach and only when you are ready for bed. Do not take your medicine more often than directed. If you have been taking this medicine for several weeks and suddenly stop taking it, you may get unpleasant withdrawal symptoms. Your doctor or health care professional may want to gradually reduce the dose. Do not stop taking this medicine on your own. Always follow your doctor or health care professional's advice. A special MedGuide will be given to you by the pharmacist with each prescription and refill. Be sure to read this information carefully each time. Talk to your pediatrician regarding the use of this medicine in children. Special care may be needed. Overdosage: If you think you have taken too much of this medicine contact a poison control center or emergency room at once. NOTE: This medicine is only for you. Do not share this medicine with others. What if I miss a dose? This does not apply. This medicine should  only be taken immediately before going to sleep. Do not take double or extra doses. What may interact with this medicine? -alcohol -antihistamines for allergy, cough and cold -certain medicines for anxiety or sleep -certain medicines for depression, like amitriptyline, fluoxetine, sertraline -certain medicines for fungal infections like ketoconazole and itraconazole -certain medicines for seizures like phenobarbital, primidone -ciprofloxacin -dietary supplements for sleep, like valerian or kava kava -general anesthetics like halothane, isoflurane, methoxyflurane, propofol -local anesthetics like lidocaine, pramoxine, tetracaine -medicines that relax muscles for surgery -narcotic medicines for pain -phenothiazines like chlorpromazine, mesoridazine, prochlorperazine, thioridazine -rifampin This list may not describe all possible interactions. Give your health care provider a list of all the medicines, herbs, non-prescription drugs, or dietary supplements you use. Also tell them if you smoke, drink alcohol, or use illegal drugs. Some items may interact with your medicine. What should I watch for while using this medicine? Visit your doctor or health care professional for regular checks on your progress. Keep a regular sleep schedule by going to bed at about the same time each night. Avoid caffeine-containing drinks in the evening hours. When sleep medicines are used every night for more than a few weeks, they may stop working. Talk to your doctor if you still have trouble sleeping. After taking this medicine for sleep, you may get up out of bed while not being fully awake and do an activity that you do not know you are doing. The next morning, you may have no memory of the event. Activities such as driving a car ("sleep-driving"), making and eating food, talking on the phone, sexual activity,   and sleep-walking have been reported. Call your doctor right away if you find out you have done any of these  activities. Do not take this medicine if you have used alcohol that evening or before bed or taken another medicine for sleep since your risk of doing these sleep-related activities will be increased. Wait for at least 8 hours after you take a dose before driving or doing other activities that require full mental alertness. Do not take this medicine unless you are able to stay in bed for a full night (7 to 8 hours) before you must be active again. You may have a decrease in mental alertness the day after use, even if you feel that you are fully awake. Tell your doctor if you will need to perform activities requiring full alertness, such as driving, the next day. Do not stand or sit up quickly after taking this medicine, especially if you are an older patient. This reduces the risk of dizzy or fainting spells. If you or your family notice any changes in your behavior, such as new or worsening depression, thoughts of harming yourself, anxiety, other unusual or disturbing thoughts, or memory loss, call your doctor right away. After you stop taking this medicine, you may have trouble falling asleep. This is called rebound insomnia. This problem usually goes away on its own after 1 or 2 nights. What side effects may I notice from receiving this medicine? Side effects that you should report to your doctor or health care professional as soon as possible: -allergic reactions like skin rash, itching or hives, swelling of the face, lips, or tongue -breathing problems -changes in vision -confusion -depressed mood or other changes in moods or emotions -feeling faint or lightheaded, falls -hallucinations -loss of balance or coordination -loss of memory -numbness or tingling of the tongue -restlessness, excitability, or feelings of anxiety or agitation -signs and symptoms of liver injury like dark yellow or brown urine; general ill feeling or flu-like symptoms; light-colored stools; loss of appetite; nausea;  right upper belly pain; unusually weak or tired; yellowing of the eyes or skin -suicidal thoughts -unusual activities while asleep like driving, eating, making phone calls, or sexual activity Side effects that usually do not require medical attention (report to your doctor or health care professional if they continue or are bothersome): -dizziness -drowsiness the day after you take this medicine -headache This list may not describe all possible side effects. Call your doctor for medical advice about side effects. You may report side effects to FDA at 1-800-FDA-1088. Where should I keep my medicine? Keep out of the reach of children. This medicine can be abused. Keep your medicine in a safe place to protect it from theft. Do not share this medicine with anyone. Selling or giving away this medicine is dangerous and against the law. This medicine may cause accidental overdose and death if taken by other adults, children, or pets. Mix any unused medicine with a substance like cat litter or coffee grounds. Then throw the medicine away in a sealed container like a sealed bag or a coffee can with a lid. Do not use the medicine after the expiration date. Store at room temperature between 20 and 25 degrees C (68 and 77 degrees F). NOTE: This sheet is a summary. It may not cover all possible information. If you have questions about this medicine, talk to your doctor, pharmacist, or health care provider.  2018 Elsevier/Gold Standard (2016-02-25 14:38:20)  

## 2017-10-12 ENCOUNTER — Other Ambulatory Visit: Payer: Self-pay | Admitting: Cardiovascular Disease

## 2017-10-13 ENCOUNTER — Telehealth: Payer: Self-pay | Admitting: Cardiovascular Disease

## 2017-10-13 NOTE — Telephone Encounter (Signed)
Request to hold eliquis 72 hours before epidural steroid injection with [facility] placed in MD basket.

## 2017-10-18 NOTE — Telephone Encounter (Signed)
Approval to hold eliquis 72 hours prior to epidural steroid w/[facility], Sharlet Salina, DO faxed to [phone]

## 2017-11-05 DIAGNOSIS — R05 Cough: Secondary | ICD-10-CM | POA: Diagnosis not present

## 2017-11-05 DIAGNOSIS — J209 Acute bronchitis, unspecified: Secondary | ICD-10-CM | POA: Diagnosis not present

## 2017-11-07 ENCOUNTER — Other Ambulatory Visit: Payer: Self-pay | Admitting: Physician Assistant

## 2017-11-07 DIAGNOSIS — F5101 Primary insomnia: Secondary | ICD-10-CM

## 2017-11-07 MED ORDER — ZOLPIDEM TARTRATE 10 MG PO TABS: 10.0000 mg | ORAL_TABLET | Freq: Every evening | ORAL | 3 refills | Status: DC | PRN

## 2017-11-07 NOTE — Telephone Encounter (Signed)
[facility] faxed refill request for the following medications:  zolpidem (AMBIEN) 10 MG tablet  Last Rx: 10/05/17 LOV: 10/05/17 Please advise. Thanks TNP

## 2017-11-09 DIAGNOSIS — R05 Cough: Secondary | ICD-10-CM | POA: Diagnosis not present

## 2017-11-11 ENCOUNTER — Other Ambulatory Visit: Payer: Self-pay | Admitting: Cardiovascular Disease

## 2017-11-28 ENCOUNTER — Other Ambulatory Visit: Payer: Self-pay | Admitting: Physician Assistant

## 2017-11-28 DIAGNOSIS — I482 Chronic atrial fibrillation, unspecified: Secondary | ICD-10-CM

## 2017-11-28 MED ORDER — METOPROLOL SUCCINATE ER 25 MG PO TB24: 12.5000 mg | ORAL_TABLET | Freq: Every day | ORAL | 1 refills | Status: DC

## 2017-11-28 NOTE — Telephone Encounter (Signed)
[facility] faxed refill request for the following medications:  metoprolol succinate (TOPROL-XL) 25 MG 24 hr tablet  90 day supply  Last Rx: 12/03/16 LOV: 10/05/17 Please advise. Thanks TNP

## 2017-11-30 DIAGNOSIS — M5030 Other cervical disc degeneration, unspecified cervical region: Secondary | ICD-10-CM | POA: Diagnosis not present

## 2017-11-30 DIAGNOSIS — M5412 Radiculopathy, cervical region: Secondary | ICD-10-CM | POA: Diagnosis not present

## 2017-12-01 ENCOUNTER — Telehealth: Payer: Self-pay | Admitting: Cardiovascular Disease

## 2017-12-01 NOTE — Telephone Encounter (Signed)
Fax received from [facility], Dr. Sharlet Salina- Dept of Physiatry  Patient scheduled for Cervical ESI #2 on 01/24/18 Request to hold Eliquis x 72 hours prior to procedure She has chronic a-fib.  To Dr. Fletcher Anon to review.

## 2017-12-01 NOTE — Telephone Encounter (Signed)
Okay to hold Eliquis for 72 hours.

## 2017-12-02 NOTE — Telephone Encounter (Signed)
Copy of phone note faxed to Dr. Sharlet Salina at [phone] with MD recommendations.  Confirmation received.

## 2017-12-28 ENCOUNTER — Ambulatory Visit (INDEPENDENT_AMBULATORY_CARE_PROVIDER_SITE_OTHER): Payer: PPO

## 2017-12-28 VITALS — BP 140/82 | HR 84 | Temp 97.9°F | Ht 68.0 in | Wt 188.8 lb

## 2017-12-28 DIAGNOSIS — Z0000 Encounter for general adult medical examination without abnormal findings: Secondary | ICD-10-CM

## 2017-12-28 NOTE — Progress Notes (Signed)
Subjective:   Teresa Harris is a 77 y.o. female who presents for Medicare Annual (Subsequent) preventive examination.  Review of Systems:  N/A  Cardiac Risk Factors include: advanced age (>50men, >43 women);dyslipidemia;hypertension     Objective:     Vitals: BP 140/82 (BP Location: Right Arm)   Pulse 84   Temp 97.9 F (36.6 C) (Oral)   Ht 5\' 8"  (1.727 m)   Wt 188 lb 12.8 oz (85.6 kg)   BMI 28.71 kg/m   Body mass index is 28.71 kg/m.  Advanced Directives 12/28/2017 10/05/2017 12/21/2016 02/16/2016 11/27/2015  Does Patient Have a Medical Advance Directive? Yes Yes Yes Yes Yes  Type of Advance Directive [place];Living will -  Does patient want to make changes to medical advance directive? - - - No - Patient declined -  Copy of [place] in Chart? - - - No - copy requested -    Tobacco Social History   Tobacco Use  Smoking Status Former Smoker  . Packs/day: 1.00  . Years: 30.00  . Pack years: 30.00  . Types: Cigarettes  . Last attempt to quit: 02/09/1996  . Years since quitting: 21.8  Smokeless Tobacco Never Used     Counseling given: Not Answered   Clinical Intake:  Pre-visit preparation completed: Yes  Pain : 0-10 Pain Score: 5  Pain Type: Chronic pain Pain Location: Neck(and shoulders) Pain Descriptors / Indicators: Aching, Burning, Sharp Pain Frequency: Intermittent     Nutritional Status: BMI 25 -29 Overweight Nutritional Risks: None Diabetes: No  How often do you need to have someone help you when you read instructions, pamphlets, or other written materials from your doctor or pharmacy?: 1 - Never  Interpreter Needed?: No  Information entered by :: [facility], LPN  Past Medical History:  Diagnosis Date  . Arthritis   . Atrial fibrillation ([place]) 10/01/14  . Complication of anesthesia    trouble waking up with Propofol  . Depression    h/o  . Dysrhythmia    a fib    . Family history of adverse reaction to anesthesia   . GERD (gastroesophageal reflux disease)   . Glaucoma    left eye  . Headache    H/O MIGRAINES  . Hemorrhoids   . Hiatal hernia   . History of migraine headaches   . Hx MRSA infection   . Hypercholesteremia   . Kidney calculi   . Osteopenia   . PONV (postoperative nausea and vomiting)    Past Surgical History:  Procedure Laterality Date  . ABDOMINAL HYSTERECTOMY    . AUGMENTATION MAMMAPLASTY Bilateral 2004  . CARPAL TUNNEL RELEASE Right 02/16/2016   Procedure: CARPAL TUNNEL RELEASE;  Surgeon: Dereck Leep, MD;  Location: [facility] ORS;  Service: Orthopedics;  Laterality: Right;  . CARPAL TUNNEL RELEASE Left 04/12/2016   Procedure: CARPAL TUNNEL RELEASE;  Surgeon: Dereck Leep, MD;  Location: [facility] ORS;  Service: Orthopedics;  Laterality: Left;  . COLONOSCOPY  12/29/09   Dr Dionne Milo  . EYE SURGERY Left    shunt placed  . FOOT SURGERY     bilateral   . JOINT REPLACEMENT    . NASAL SEPTUM SURGERY    . REPLACEMENT TOTAL KNEE     bilateral  . SHOULDER SURGERY     right shoulder   Family History  Problem Relation Age of Onset  . Arthritis Mother   . Arthritis Father  Social History   Socioeconomic History  . Marital status: Divorced    Spouse name: None  . Number of children: 2  . Years of education: None  . Highest education level: Some college, no degree  Social Needs  . Financial resource strain: Not hard at all  . Food insecurity - worry: Never true  . Food insecurity - inability: Never true  . Transportation needs - medical: No  . Transportation needs - non-medical: No  Occupational History  . Occupation: retired  Tobacco Use  . Smoking status: Former Smoker    Packs/day: 1.00    Years: 30.00    Pack years: 30.00    Types: Cigarettes    Last attempt to quit: 02/09/1996    Years since quitting: 21.8  . Smokeless tobacco: Never Used  Substance and Sexual Activity  . Alcohol use: No  . Drug use: No  .  Sexual activity: None  Other Topics Concern  . None  Social History Narrative  . None    Outpatient Encounter Medications as of 12/28/2017  Medication Sig  . calcium carbonate (OS-CAL) 600 MG TABS Take 600 mg by mouth 2 (two) times daily with a meal.   . celecoxib (CELEBREX) 200 MG capsule TAKE 1 CAPSULE BY MOUTH ONCE DAILY.  . cycloSPORINE (RESTASIS) 0.05 % ophthalmic emulsion Place 1 drop into both eyes 2 (two) times daily.   Marland Kitchen DIGOX 125 MCG tablet TAKE 1 TABLET BY MOUTH ONCE EVERY MORNING  . dorzolamide-timolol (COSOPT) 22.3-6.8 MG/ML ophthalmic solution Place 1 drop into the right eye 2 (two) times daily.   Marland Kitchen ELIQUIS 5 MG TABS tablet TAKE 1 TABLET BY MOUTH TWICE DAILY  . estrogens, conjugated, (PREMARIN) 0.625 MG tablet Take 1 tablet (0.625 mg total) by mouth daily.  . Magnesium 400 MG CAPS Take 400 mg by mouth 2 (two) times daily.  . metoprolol succinate (TOPROL-XL) 25 MG 24 hr tablet Take 0.5 tablets (12.5 mg total) by mouth daily.  . Multiple Vitamin (MULTIVITAMIN) tablet Take 1 tablet by mouth daily.  . Omega-3 Fatty Acids (FISH OIL) 1200 MG CAPS Take 1,200 mg by mouth 2 (two) times daily.  Marland Kitchen omeprazole (PRILOSEC) 40 MG capsule Take 40 mg by mouth every morning.   . zolpidem (AMBIEN) 10 MG tablet Take 1 tablet (10 mg total) by mouth at bedtime as needed for sleep.   No facility-administered encounter medications on file as of 12/28/2017.     Activities of Daily Living In your present state of health, do you have any difficulty performing the following activities: 12/28/2017  Hearing? N  Vision? Y  Comment Due to glaucoma and cataracts.  Difficulty concentrating or making decisions? N  Walking or climbing stairs? N  Dressing or bathing? N  Doing errands, shopping? N  Preparing Food and eating ? N  Using the Toilet? N  In the past six months, have you accidently leaked urine? N  Do you have problems with loss of bowel control? N  Managing your Medications? N  Managing your  Finances? N  Housekeeping or managing your Housekeeping? N  Some recent data might be hidden    Patient Care Team: [name], PA-C as PCP - General (Family Medicine) Wellington Hampshire, MD as Consulting Physician (Cardiology) Eulogio Bear, MD as Consulting Physician (Ophthalmology) Marry Guan, Laurice Record, MD as Consulting Physician (Orthopedic Surgery) Watt Climes, PA as Physician Assistant (Physician Assistant) Sharlet Salina, MD as Referring Physician (Physical Medicine and Rehabilitation)    Assessment:  This is a routine wellness examination for Riko.  Exercise Activities and Dietary recommendations Current Exercise Habits: Home exercise routine, Type of exercise: walking(walks to mailbox and back), Time (Minutes): 15, Frequency (Times/Week): 5, Weekly Exercise (Minutes/Week): 75, Intensity: Mild, Exercise limited by: orthopedic condition(s)  Goals    . DIET - INCREASE WATER INTAKE     Recommend increasing water intake to 6-8 glasses a day.        Fall Risk Fall Risk  12/28/2017 12/21/2016 11/27/2015  Falls in the past year? No No No   Is the patient's home free of loose throw rugs in walkways, pet beds, electrical cords, etc?   yes      Grab bars in the bathroom? yes      Handrails on the stairs?   yes      Adequate lighting?   yes  Timed Get Up and Go performed: N/A  Depression Screen PHQ 2/9 Scores 12/28/2017 10/05/2017 12/21/2016 11/27/2015  PHQ - 2 Score 0 1 0 0     Cognitive Function: Pt declined screening today.         Immunization History  Administered Date(s) Administered  . Influenza, High Dose Seasonal PF 08/26/2016, 09/07/2017  . Influenza,inj,Quad PF,6+ Mos 09/09/2014  . Influenza-Unspecified 10/08/2015  . Pneumococcal Conjugate-13 11/11/2014  . Pneumococcal Polysaccharide-23 10/27/2012  . Tdap 12/03/2010    Qualifies for Shingles Vaccine? Due for Shingles vaccine. Declined my offer to administer today. Education has been  provided regarding the importance of this vaccine. Pt has been advised to call her insurance company to determine her out of pocket expense. Advised she may also receive this vaccine at her local pharmacy or Health Dept. Verbalized acceptance and understanding.   Screening Tests Health Maintenance  Topic Date Due  . COLONOSCOPY  12/30/2019  . TETANUS/TDAP  12/03/2020  . INFLUENZA VACCINE  Completed  . DEXA SCAN  Completed  . PNA vac Low Risk Adult  Completed    Cancer Screenings: Lung: Low Dose CT Chest recommended if Age 22-80 years, 30 pack-year currently smoking OR have quit w/in 15years. Patient does not qualify. Breast:  Up to date on Mammogram? Yes   Up to date of Bone Density/Dexa? Yes Colorectal: Up to date  Additional Screenings:  Hepatitis B/HIV/Syphillis: Pt declines today.  Hepatitis C Screening: Pt declines today.      Plan:  I have personally reviewed and addressed the Medicare Annual Wellness questionnaire and have noted the following in the patient's chart:  A. Medical and social history B. Use of alcohol, tobacco or illicit drugs  C. Current medications and supplements D. Functional ability and status E.  Nutritional status F.  Physical activity G. Advance directives H. List of other physicians I.  Hospitalizations, surgeries, and ER visits in previous 12 months J.  [place] such as hearing and vision if needed, cognitive and depression L. Referrals and appointments - none  In addition, I have reviewed and discussed with patient certain preventive protocols, quality metrics, and best practice recommendations. A written personalized care plan for preventive services as well as general preventive health recommendations were provided to patient.  See attached scanned questionnaire for additional information.   Signed,  Fabio Neighbors, LPN Nurse Health Advisor   Nurse Recommendations: None.

## 2017-12-28 NOTE — Patient Instructions (Signed)
Teresa Harris , Thank you for taking time to come for your Medicare Wellness Visit. I appreciate your ongoing commitment to your health goals. Please review the following plan we discussed and let me know if I can assist you in the future.   Screening recommendations/referrals: Colonoscopy: Up to date Mammogram: Up to date Bone Density: Up to date Recommended yearly ophthalmology/optometry visit for glaucoma screening and checkup Recommended yearly dental visit for hygiene and checkup  Vaccinations: Influenza vaccine: Up to date Pneumococcal vaccine: Up to date Tdap vaccine: Up to date Shingles vaccine: Pt declines today.     Advanced directives: Please bring a copy of your POA (Power of Attorney) and/or Living Will to your next appointment.   Conditions/risks identified: Recommend increasing water intake to 6-8 glasses a day.   Next appointment: 12/30/17 @ 10:00 AM   Preventive Care 76 Years and Older, Female Preventive care refers to lifestyle choices and visits with your health care provider that can promote health and wellness. What does preventive care include?  A yearly physical exam. This is also called an annual well check.  Dental exams once or twice a year.  Routine eye exams. Ask your health care provider how often you should have your eyes checked.  Personal lifestyle choices, including:  Daily care of your teeth and gums.  Regular physical activity.  Eating a healthy diet.  Avoiding tobacco and drug use.  Limiting alcohol use.  Practicing safe sex.  Taking low-dose aspirin every day.  Taking vitamin and mineral supplements as recommended by your health care provider. What happens during an annual well check? The services and screenings done by your health care provider during your annual well check will depend on your age, overall health, lifestyle risk factors, and family history of disease. Counseling  Your health care provider may ask you questions  about your:  Alcohol use.  Tobacco use.  Drug use.  Emotional well-being.  Home and relationship well-being.  Sexual activity.  Eating habits.  History of falls.  Memory and ability to understand (cognition).  Work and work Statistician.  Reproductive health. Screening  You may have the following tests or measurements:  Height, weight, and BMI.  Blood pressure.  Lipid and cholesterol levels. These may be checked every 5 years, or more frequently if you are over 76 years old.  Skin check.  Lung cancer screening. You may have this screening every year starting at age 45 if you have a 30-pack-year history of smoking and currently smoke or have quit within the past 15 years.  Fecal occult blood test (FOBT) of the stool. You may have this test every year starting at age 22.  Flexible sigmoidoscopy or colonoscopy. You may have a sigmoidoscopy every 5 years or a colonoscopy every 10 years starting at age 50.  Hepatitis C blood test.  Hepatitis B blood test.  Sexually transmitted disease (STD) testing.  Diabetes screening. This is done by checking your blood sugar (glucose) after you have not eaten for a while (fasting). You may have this done every 1-3 years.  Bone density scan. This is done to screen for osteoporosis. You may have this done starting at age 29.  Mammogram. This may be done every 1-2 years. Talk to your health care provider about how often you should have regular mammograms. Talk with your health care provider about your test results, treatment options, and if necessary, the need for more tests. Vaccines  Your health care provider may recommend certain vaccines, such  as:  Influenza vaccine. This is recommended every year.  Tetanus, diphtheria, and acellular pertussis (Tdap, Td) vaccine. You may need a Td booster every 10 years.  Zoster vaccine. You may need this after age 11.  Pneumococcal 13-valent conjugate (PCV13) vaccine. One dose is  recommended after age 36.  Pneumococcal polysaccharide (PPSV23) vaccine. One dose is recommended after age 64. Talk to your health care provider about which screenings and vaccines you need and how often you need them. This information is not intended to replace advice given to you by your health care provider. Make sure you discuss any questions you have with your health care provider. Document Released: 12/19/2015 Document Revised: 08/11/2016 Document Reviewed: 09/23/2015 Elsevier Interactive Patient Education  2017 [place] Prevention in the Home Falls can cause injuries. They can happen to people of all ages. There are many things you can do to make your home safe and to help prevent falls. What can I do on the outside of my home?  Regularly fix the edges of walkways and driveways and fix any cracks.  Remove anything that might make you trip as you walk through a door, such as a raised step or threshold.  Trim any bushes or trees on the path to your home.  Use bright outdoor lighting.  Clear any walking paths of anything that might make someone trip, such as rocks or tools.  Regularly check to see if handrails are loose or broken. Make sure that both sides of any steps have handrails.  Any raised decks and porches should have guardrails on the edges.  Have any leaves, snow, or ice cleared regularly.  Use sand or salt on walking paths during winter.  Clean up any spills in your garage right away. This includes oil or grease spills. What can I do in the bathroom?  Use night lights.  Install grab bars by the toilet and in the tub and shower. Do not use towel bars as grab bars.  Use non-skid mats or decals in the tub or shower.  If you need to sit down in the shower, use a plastic, non-slip stool.  Keep the floor dry. Clean up any water that spills on the floor as soon as it happens.  Remove soap buildup in the tub or shower regularly.  Attach bath mats  securely with double-sided non-slip rug tape.  Do not have throw rugs and other things on the floor that can make you trip. What can I do in the bedroom?  Use night lights.  Make sure that you have a light by your bed that is easy to reach.  Do not use any sheets or blankets that are too big for your bed. They should not hang down onto the floor.  Have a firm chair that has side arms. You can use this for support while you get dressed.  Do not have throw rugs and other things on the floor that can make you trip. What can I do in the kitchen?  Clean up any spills right away.  Avoid walking on wet floors.  Keep items that you use a lot in easy-to-reach places.  If you need to reach something above you, use a strong step stool that has a grab bar.  Keep electrical cords out of the way.  Do not use floor polish or wax that makes floors slippery. If you must use wax, use non-skid floor wax.  Do not have throw rugs and other things on the  floor that can make you trip. What can I do with my stairs?  Do not leave any items on the stairs.  Make sure that there are handrails on both sides of the stairs and use them. Fix handrails that are broken or loose. Make sure that handrails are as long as the stairways.  Check any carpeting to make sure that it is firmly attached to the stairs. Fix any carpet that is loose or worn.  Avoid having throw rugs at the top or bottom of the stairs. If you do have throw rugs, attach them to the floor with carpet tape.  Make sure that you have a light switch at the top of the stairs and the bottom of the stairs. If you do not have them, ask someone to add them for you. What else can I do to help prevent falls?  Wear shoes that:  Do not have high heels.  Have rubber bottoms.  Are comfortable and fit you well.  Are closed at the toe. Do not wear sandals.  If you use a stepladder:  Make sure that it is fully opened. Do not climb a closed  stepladder.  Make sure that both sides of the stepladder are locked into place.  Ask someone to hold it for you, if possible.  Clearly mark and make sure that you can see:  Any grab bars or handrails.  First and last steps.  Where the edge of each step is.  Use tools that help you move around (mobility aids) if they are needed. These include:  Canes.  Walkers.  Scooters.  Crutches.  Turn on the lights when you go into a dark area. Replace any light bulbs as soon as they burn out.  Set up your furniture so you have a clear path. Avoid moving your furniture around.  If any of your floors are uneven, fix them.  If there are any pets around you, be aware of where they are.  Review your medicines with your doctor. Some medicines can make you feel dizzy. This can increase your chance of falling. Ask your doctor what other things that you can do to help prevent falls. This information is not intended to replace advice given to you by your health care provider. Make sure you discuss any questions you have with your health care provider. Document Released: 09/18/2009 Document Revised: 04/29/2016 Document Reviewed: 12/27/2014 Elsevier Interactive Patient Education  2017 [employer].

## 2017-12-30 ENCOUNTER — Encounter: Payer: Self-pay | Admitting: Physician Assistant

## 2017-12-30 ENCOUNTER — Ambulatory Visit (INDEPENDENT_AMBULATORY_CARE_PROVIDER_SITE_OTHER): Payer: PPO | Admitting: Physician Assistant

## 2017-12-30 VITALS — BP 130/86 | HR 78 | Temp 97.9°F | Resp 16 | Ht 68.0 in | Wt 183.6 lb

## 2017-12-30 DIAGNOSIS — E875 Hyperkalemia: Secondary | ICD-10-CM | POA: Diagnosis not present

## 2017-12-30 DIAGNOSIS — Z1231 Encounter for screening mammogram for malignant neoplasm of breast: Secondary | ICD-10-CM | POA: Diagnosis not present

## 2017-12-30 DIAGNOSIS — Z1211 Encounter for screening for malignant neoplasm of colon: Secondary | ICD-10-CM | POA: Diagnosis not present

## 2017-12-30 DIAGNOSIS — I482 Chronic atrial fibrillation, unspecified: Secondary | ICD-10-CM

## 2017-12-30 DIAGNOSIS — I10 Essential (primary) hypertension: Secondary | ICD-10-CM | POA: Diagnosis not present

## 2017-12-30 DIAGNOSIS — F5101 Primary insomnia: Secondary | ICD-10-CM | POA: Diagnosis not present

## 2017-12-30 DIAGNOSIS — M1990 Unspecified osteoarthritis, unspecified site: Secondary | ICD-10-CM

## 2017-12-30 DIAGNOSIS — Z0000 Encounter for general adult medical examination without abnormal findings: Secondary | ICD-10-CM

## 2017-12-30 DIAGNOSIS — Z1239 Encounter for other screening for malignant neoplasm of breast: Secondary | ICD-10-CM

## 2017-12-30 DIAGNOSIS — E559 Vitamin D deficiency, unspecified: Secondary | ICD-10-CM | POA: Diagnosis not present

## 2017-12-30 DIAGNOSIS — E7800 Pure hypercholesterolemia, unspecified: Secondary | ICD-10-CM | POA: Diagnosis not present

## 2017-12-30 MED ORDER — ESZOPICLONE 3 MG PO TABS: 3.0000 mg | ORAL_TABLET | Freq: Every day | ORAL | 0 refills | Status: DC

## 2017-12-30 MED ORDER — CELECOXIB 200 MG PO CAPS: 200.0000 mg | ORAL_CAPSULE | Freq: Two times a day (BID) | ORAL | 5 refills | Status: DC

## 2017-12-30 NOTE — Patient Instructions (Signed)

## 2017-12-30 NOTE — Progress Notes (Signed)
Patient: Teresa Harris, Female    DOB: 1941/10/13, 77 y.o.   MRN: [id number] Visit Date: 12/30/2017  Today's Provider: [name], PA-C   Chief Complaint  Patient presents with  . Medicare Wellness   Subjective:    Annual wellness visit Teresa Harris is a 77 y.o. female. She feels fairly well. She reports exercising walking. She reports she is sleeping poorly. 12/28/17 AWV- w/McKenzie 12/21/16 AWE -----------------------------------------------------------  Patient C/O worsening insomnia. Patient reports that Ambien is not helping at all.   Patient C/O worsening arthritis, patient reports that Celebrex is not helping her pain.  Patient reports that she was taking Aleve and Ibuprofen for pain. Patient reports that Orthopedic told her not to take those medications. Patient reports that she has a ruptured disc in her neck. Patient reports that she F/B Dr. Margette Fast who has given her epidural shots.    Review of Systems  Constitutional: Positive for activity change.  HENT: Negative.   Eyes: Negative.   Respiratory: Negative.   Cardiovascular: Negative.   Gastrointestinal: Negative.   Endocrine: Negative.   Genitourinary: Negative.   Musculoskeletal: Positive for arthralgias and neck pain.  Skin: Negative.   Allergic/Immunologic: Negative.   Neurological: Negative.   Hematological: Negative.   Psychiatric/Behavioral: Positive for sleep disturbance.    Social History   Socioeconomic History  . Marital status: Divorced    Spouse name: Not on file  . Number of children: 2  . Years of education: Not on file  . Highest education level: Some college, no degree  Social Needs  . Financial resource strain: Not hard at all  . Food insecurity - worry: Never true  . Food insecurity - inability: Never true  . Transportation needs - medical: No  . Transportation needs - non-medical: No  Occupational History  . Occupation: retired  Tobacco Use  . Smoking  status: Former Smoker    Packs/day: 1.00    Years: 30.00    Pack years: 30.00    Types: Cigarettes    Last attempt to quit: 02/09/1996    Years since quitting: 21.9  . Smokeless tobacco: Never Used  Substance and Sexual Activity  . Alcohol use: No  . Drug use: No  . Sexual activity: Not on file  Other Topics Concern  . Not on file  Social History Narrative  . Not on file    Past Medical History:  Diagnosis Date  . Arthritis   . Atrial fibrillation ([place]) 10/01/14  . Complication of anesthesia    trouble waking up with Propofol  . Depression    h/o  . Dysrhythmia    a fib  . Family history of adverse reaction to anesthesia   . GERD (gastroesophageal reflux disease)   . Glaucoma    left eye  . Headache    H/O MIGRAINES  . Hemorrhoids   . Hiatal hernia   . History of migraine headaches   . Hx MRSA infection   . Hypercholesteremia   . Kidney calculi   . Osteopenia   . PONV (postoperative nausea and vomiting)      Patient Active Problem List   Diagnosis Date Noted  . Avitaminosis D 11/18/2015  . Osteopenia 11/18/2015  . HLD (hyperlipidemia) 11/18/2015  . High potassium 11/18/2015  . Post menopausal syndrome 11/18/2015  . 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 Atrial fibrillation, chronic ([place]) 11/18/2015  . Adverse effect of anesthesia 11/18/2015  . Hypertension 06/23/2015  . Arthritis 05/22/2015  .  Internal hemorrhoid 11/23/2014  . Rectal bleeding 11/23/2014  . Left-sided low back pain with left-sided sciatica 10/25/2014  . Pain in the chest 10/03/2014  . Hemorrhoid 10/03/2014  . Bursitis, trochanteric 07/16/2014  . Left lumbar radiculitis 07/16/2014  . Chronic infection of sinus 08/10/2013  . Glaucoma 08/10/2013  . Osteoarthritis 08/10/2013  . PONV (postoperative nausea and vomiting) 08/10/2013  . Palpitations 07/05/2013    Past Surgical History:  Procedure Laterality Date  . ABDOMINAL HYSTERECTOMY    . AUGMENTATION MAMMAPLASTY Bilateral 2004  . CARPAL  TUNNEL RELEASE Right 02/16/2016   Procedure: CARPAL TUNNEL RELEASE;  Surgeon: Dereck Leep, MD;  Location: [facility] ORS;  Service: Orthopedics;  Laterality: Right;  . CARPAL TUNNEL RELEASE Left 04/12/2016   Procedure: CARPAL TUNNEL RELEASE;  Surgeon: Dereck Leep, MD;  Location: [facility] ORS;  Service: Orthopedics;  Laterality: Left;  . COLONOSCOPY  12/29/09   Dr Dionne Milo  . EYE SURGERY Left    shunt placed  . FOOT SURGERY     bilateral   . JOINT REPLACEMENT    . NASAL SEPTUM SURGERY    . REPLACEMENT TOTAL KNEE     bilateral  . SHOULDER SURGERY     right shoulder    Her family history includes Arthritis in her father and mother.      Current Outpatient Medications:  .  calcium carbonate (OS-CAL) 600 MG TABS, Take 600 mg by mouth 2 (two) times daily with a meal. , Disp: , Rfl:  .  celecoxib (CELEBREX) 200 MG capsule, TAKE 1 CAPSULE BY MOUTH ONCE DAILY., Disp: 30 capsule, Rfl: 5 .  cycloSPORINE (RESTASIS) 0.05 % ophthalmic emulsion, Place 1 drop into both eyes 2 (two) times daily. , Disp: , Rfl:  .  DIGOX 125 MCG tablet, TAKE 1 TABLET BY MOUTH ONCE EVERY MORNING, Disp: 30 tablet, Rfl: 6 .  dorzolamide-timolol (COSOPT) 22.3-6.8 MG/ML ophthalmic solution, Place 1 drop into the right eye 2 (two) times daily. , Disp: , Rfl:  .  ELIQUIS 5 MG TABS tablet, TAKE 1 TABLET BY MOUTH TWICE DAILY, Disp: 60 tablet, Rfl: 6 .  estrogens, conjugated, (PREMARIN) 0.625 MG tablet, Take 1 tablet (0.625 mg total) by mouth daily., Disp: 90 tablet, Rfl: 3 .  Magnesium 400 MG CAPS, Take 400 mg by mouth 2 (two) times daily., Disp: , Rfl:  .  metoprolol succinate (TOPROL-XL) 25 MG 24 hr tablet, Take 0.5 tablets (12.5 mg total) by mouth daily., Disp: 90 tablet, Rfl: 1 .  Multiple Vitamin (MULTIVITAMIN) tablet, Take 1 tablet by mouth daily., Disp: , Rfl:  .  Omega-3 Fatty Acids (FISH OIL) 1200 MG CAPS, Take 1,200 mg by mouth 2 (two) times daily., Disp: , Rfl:  .  omeprazole (PRILOSEC) 40 MG capsule, Take 40 mg by mouth  every morning. , Disp: , Rfl:  .  zolpidem (AMBIEN) 10 MG tablet, Take 1 tablet (10 mg total) by mouth at bedtime as needed for sleep., Disp: 30 tablet, Rfl: 3  Patient Care Team: [name], PA-C as PCP - General (Family Medicine) Wellington Hampshire, MD as Consulting Physician (Cardiology) Eulogio Bear, MD as Consulting Physician (Ophthalmology) Marry Guan, Laurice Record, MD as Consulting Physician (Orthopedic Surgery) Watt Climes, PA as Physician Assistant (Physician Assistant) Sharlet Salina, MD as Referring Physician (Physical Medicine and Rehabilitation)     Objective:   Vitals: BP 130/86 (BP Location: Left Arm, Patient Position: Sitting, Cuff Size: Large)   Pulse 78   Temp 97.9 F (36.6 C) (  Oral)   Resp 16   Ht 5\' 8"  (1.727 m)   Wt 183 lb 9.6 oz (83.3 kg)   SpO2 97%   BMI 27.92 kg/m   Physical Exam  Constitutional: She is oriented to person, place, and time. She appears well-developed and well-nourished. No distress.  HENT:  Head: Normocephalic and atraumatic.  Right Ear: Hearing, tympanic membrane, external ear and ear canal normal.  Left Ear: Hearing, tympanic membrane, external ear and ear canal normal.  Nose: Nose normal.  Mouth/Throat: Uvula is midline, oropharynx is clear and moist and mucous membranes are normal. No oropharyngeal exudate.  Eyes: Conjunctivae and EOM are normal. Pupils are equal, round, and reactive to light. Right eye exhibits no discharge. Left eye exhibits no discharge. No scleral icterus.  Neck: Normal range of motion. Neck supple. No JVD present. Carotid bruit is not present. No tracheal deviation present. No thyromegaly present.  Cardiovascular: Normal rate, regular rhythm, normal heart sounds and intact distal pulses. Exam reveals no gallop and no friction rub.  No murmur heard. Pulmonary/Chest: Effort normal and breath sounds normal. No respiratory distress. She has no wheezes. She has no rales. She exhibits no tenderness. Right  breast exhibits no inverted nipple, no mass, no nipple discharge, no skin change and no tenderness. Left breast exhibits no inverted nipple, no mass, no nipple discharge, no skin change and no tenderness. Breasts are symmetrical.  Abdominal: Soft. Bowel sounds are normal. She exhibits no distension and no mass. There is no tenderness. There is no rebound and no guarding.  Genitourinary: Pelvic exam was performed with patient supine.  Musculoskeletal: Normal range of motion. She exhibits no edema or tenderness.  Lymphadenopathy:    She has no cervical adenopathy.  Neurological: She is alert and oriented to person, place, and time. She has normal reflexes. No cranial nerve deficit. Coordination normal.  Skin: Skin is warm and dry. No rash noted. She is not diaphoretic.  Psychiatric: She has a normal mood and affect. Her behavior is normal. Judgment and thought content normal.  Vitals reviewed.   Activities of Daily Living In your present state of health, do you have any difficulty performing the following activities: 12/28/2017  Hearing? N  Vision? Y  Comment Due to glaucoma and cataracts.  Difficulty concentrating or making decisions? N  Walking or climbing stairs? N  Dressing or bathing? N  Doing errands, shopping? N  Preparing Food and eating ? N  Using the Toilet? N  In the past six months, have you accidently leaked urine? N  Do you have problems with loss of bowel control? N  Managing your Medications? N  Managing your Finances? N  Housekeeping or managing your Housekeeping? N  Some recent data might be hidden    Fall Risk Assessment Fall Risk  12/28/2017 12/21/2016 11/27/2015  Falls in the past year? No No No     Depression Screen PHQ 2/9 Scores 12/28/2017 10/05/2017 12/21/2016 11/27/2015  PHQ - 2 Score 0 1 0 0     Assessment & Plan:     Annual Wellness Visit  Reviewed patient's Family Medical History Reviewed and updated list of patient's medical providers Assessment  of cognitive impairment was done Assessed patient's functional ability Established a written schedule for health screening [place] Completed and Reviewed  Exercise Activities and Dietary recommendations Goals    . DIET - INCREASE WATER INTAKE     Recommend increasing water intake to 6-8 glasses a day.  Immunization History  Administered Date(s) Administered  . Influenza, High Dose Seasonal PF 08/26/2016, 09/07/2017  . Influenza,inj,Quad PF,6+ Mos 09/09/2014  . Influenza-Unspecified 10/08/2015  . Pneumococcal Conjugate-13 11/11/2014  . Pneumococcal Polysaccharide-23 10/27/2012  . Tdap 12/03/2010    Health Maintenance  Topic Date Due  . COLONOSCOPY  12/30/2019  . TETANUS/TDAP  12/03/2020  . INFLUENZA VACCINE  Completed  . DEXA SCAN  Completed  . PNA vac Low Risk Adult  Completed     Discussed health benefits of physical activity, and encouraged her to engage in regular exercise appropriate for her age and condition.    1. Annual physical exam Normal physical exam. Patient advised to call insurance about Shingrix vaccine.   2. Breast cancer screening Mammogram due in 06/2018.  3. Colon cancer screening Not due until 2021.   4. Essential hypertension Stable. Will check labs as below and f/u pending results. - CBC with Differential/Platelet - Comprehensive metabolic panel - Lipid Panel With LDL/HDL Ratio  5. Pure hypercholesterolemia Stable on fish oil. Will check labs as below and f/u pending results. - CBC with Differential/Platelet - Comprehensive metabolic panel - Lipid Panel With LDL/HDL Ratio  6. Avitaminosis D Stable. Will check labs as below and f/u pending results. - CBC with Differential/Platelet - VITAMIN D 25 Hydroxy (Vit-D Deficiency, Fractures)  7. Atrial fibrillation, chronic ([place]) Followed by Dr. Fletcher Anon. On Eliquis 5 mg BID for anticoagulation.   8. Arthritis Worsening. Will increase Celebrex to BID dosing prn for  arthritic pains.  - celecoxib (CELEBREX) 200 MG capsule; Take 1 capsule (200 mg total) by mouth 2 (two) times daily.  Dispense: 60 capsule; Refill: 5  9. Primary insomnia Worsening. Failed trazodone, temazepam, and zolpidem 5mg  and 10mg . Will change to [facility] as below. I will see her back in 4 weeks to see how she is doing.  - Eszopiclone 3 MG TABS; Take 1 tablet (3 mg total) by mouth at bedtime. Take immediately before bedtime  Dispense: 30 tablet; Refill: 0  10. High potassium Will check labs as below and f/u pending results. - Comprehensive metabolic panel  ------------------------------------------------------------------------------------------------------------    [name], PA-C  [facility]

## 2017-12-31 LAB — CBC WITH DIFFERENTIAL/PLATELET
Basophils Absolute: 0 10*3/uL (ref 0.0–0.2)
Basos: 0 %
EOS (ABSOLUTE): 0.1 10*3/uL (ref 0.0–0.4)
Eos: 2 %
Hematocrit: 42.3 % (ref 34.0–46.6)
Hemoglobin: 14.6 g/dL (ref 11.1–15.9)
Immature Grans (Abs): 0 10*3/uL (ref 0.0–0.1)
Immature Granulocytes: 0 %
Lymphocytes Absolute: 1.3 10*3/uL (ref 0.7–3.1)
Lymphs: 19 %
MCH: 31.7 pg (ref 26.6–33.0)
MCHC: 34.5 g/dL (ref 31.5–35.7)
MCV: 92 fL (ref 79–97)
Monocytes Absolute: 0.6 10*3/uL (ref 0.1–0.9)
Monocytes: 9 %
Neutrophils Absolute: 4.7 10*3/uL (ref 1.4–7.0)
Neutrophils: 70 %
Platelets: 226 10*3/uL (ref 150–379)
RBC: 4.6 x10E6/uL (ref 3.77–5.28)
RDW: 13.2 % (ref 12.3–15.4)
WBC: 6.8 10*3/uL (ref 3.4–10.8)

## 2017-12-31 LAB — COMPREHENSIVE METABOLIC PANEL
ALT: 20 IU/L (ref 0–32)
AST: 22 IU/L (ref 0–40)
Albumin/Globulin Ratio: 1.3 (ref 1.2–2.2)
Albumin: 4.1 g/dL (ref 3.5–4.8)
Alkaline Phosphatase: 58 IU/L (ref 39–117)
BUN/Creatinine Ratio: 21 (ref 12–28)
BUN: 13 mg/dL (ref 8–27)
Bilirubin Total: 0.5 mg/dL (ref 0.0–1.2)
CO2: 21 mmol/L (ref 20–29)
Calcium: 9.3 mg/dL (ref 8.7–10.3)
Chloride: 103 mmol/L (ref 96–106)
Creatinine, Ser: 0.63 mg/dL (ref 0.57–1.00)
GFR calc Af Amer: 101 mL/min/{1.73_m2} (ref >59)
GFR calc non Af Amer: 88 mL/min/{1.73_m2} (ref >59)
Globulin, Total: 3.2 g/dL (ref 1.5–4.5)
Glucose: 111 mg/dL — ABNORMAL HIGH (ref 65–99)
Potassium: 4.8 mmol/L (ref 3.5–5.2)
Sodium: 141 mmol/L (ref 134–144)
Total Protein: 7.3 g/dL (ref 6.0–8.5)

## 2017-12-31 LAB — VITAMIN D 25 HYDROXY (VIT D DEFICIENCY, FRACTURES): Vit D, 25-Hydroxy: 67.3 ng/mL (ref 30.0–100.0)

## 2017-12-31 LAB — LIPID PANEL WITH LDL/HDL RATIO
Cholesterol, Total: 170 mg/dL (ref 100–199)
HDL: 45 mg/dL (ref >39)
LDL Calculated: 76 mg/dL (ref 0–99)
LDl/HDL Ratio: 1.7 ratio (ref 0.0–3.2)
Triglycerides: 243 mg/dL — ABNORMAL HIGH (ref 0–149)
VLDL Cholesterol Cal: 49 mg/dL — ABNORMAL HIGH (ref 5–40)

## 2018-01-02 ENCOUNTER — Telehealth: Payer: Self-pay

## 2018-01-02 NOTE — Telephone Encounter (Signed)
Left message to call back  

## 2018-01-02 NOTE — Telephone Encounter (Signed)
-----   Message from [name], PA-C sent at 01/02/2018  9:51 AM EST ----- All labs are within normal limits and stable.  Thanks! -JB

## 2018-01-02 NOTE — Telephone Encounter (Signed)
Patient advised as below.  

## 2018-01-10 DIAGNOSIS — L570 Actinic keratosis: Secondary | ICD-10-CM | POA: Diagnosis not present

## 2018-01-10 DIAGNOSIS — L718 Other rosacea: Secondary | ICD-10-CM | POA: Diagnosis not present

## 2018-01-24 DIAGNOSIS — H401123 Primary open-angle glaucoma, left eye, severe stage: Secondary | ICD-10-CM | POA: Diagnosis not present

## 2018-01-24 DIAGNOSIS — M5030 Other cervical disc degeneration, unspecified cervical region: Secondary | ICD-10-CM | POA: Diagnosis not present

## 2018-01-24 DIAGNOSIS — M5412 Radiculopathy, cervical region: Secondary | ICD-10-CM | POA: Diagnosis not present

## 2018-01-26 ENCOUNTER — Other Ambulatory Visit: Payer: Self-pay | Admitting: Family Medicine

## 2018-01-26 DIAGNOSIS — F5101 Primary insomnia: Secondary | ICD-10-CM

## 2018-01-26 NOTE — Telephone Encounter (Signed)
I think you see her in next few days.

## 2018-01-27 ENCOUNTER — Ambulatory Visit
Admission: RE | Admit: 2018-01-27 | Discharge: 2018-01-27 | Disposition: A | Discharge status: Home / Self Care | Payer: PPO | Source: Ambulatory Visit | Attending: Physician Assistant | Admitting: Physician Assistant

## 2018-01-27 ENCOUNTER — Encounter: Payer: Self-pay | Admitting: Physician Assistant

## 2018-01-27 ENCOUNTER — Telehealth: Payer: Self-pay

## 2018-01-27 ENCOUNTER — Ambulatory Visit (INDEPENDENT_AMBULATORY_CARE_PROVIDER_SITE_OTHER): Payer: PPO | Admitting: Physician Assistant

## 2018-01-27 VITALS — BP 130/90 | HR 76 | Temp 97.7°F | Resp 16 | Wt 184.6 lb

## 2018-01-27 DIAGNOSIS — F5101 Primary insomnia: Secondary | ICD-10-CM

## 2018-01-27 DIAGNOSIS — M533 Sacrococcygeal disorders, not elsewhere classified: Secondary | ICD-10-CM | POA: Diagnosis not present

## 2018-01-27 HISTORY — DX: Sacrococcygeal disorders, not elsewhere classified: M53.3

## 2018-01-27 MED ORDER — ZOLPIDEM TARTRATE ER 12.5 MG PO TBCR: 12.5000 mg | EXTENDED_RELEASE_TABLET | Freq: Every evening | ORAL | 0 refills | Status: DC | PRN

## 2018-01-27 NOTE — Patient Instructions (Signed)
Zolpidem extended-release tablets What is this medicine? ZOLPIDEM (zole PI dem) is used to treat insomnia. This medicine helps you to fall asleep and sleep through the night. This medicine may be used for other purposes; ask your health care provider or pharmacist if you have questions. COMMON BRAND NAME(S): Ambien CR What should I tell my health care provider before I take this medicine? They need to know if you have any of these conditions: -depression -history of drug abuse or addiction -if you often drink alcohol -liver disease -lung or breathing disease -myasthenia gravis -sleep apnea -suicidal thoughts, plans, or attempt; a previous suicide attempt by you or a family member -an unusual or allergic reaction to zolpidem, other medicines, foods, dyes, or preservatives -pregnant or trying to get pregnant -breast-feeding How should I use this medicine? Take this medicine by mouth with a glass of water. Follow the directions on the prescription label. Do not crush, split, or chew the tablet before swallowing. It is better to take this medicine on an empty stomach and only when you are ready for bed. Do not take your medicine more often than directed. If you have been taking this medicine for several weeks and suddenly stop taking it, you may get unpleasant withdrawal symptoms. Your doctor or health care professional may want to gradually reduce the dose. Do not stop taking this medicine on your own. Always follow your doctor or health care professional's advice. A special MedGuide will be given to you by the pharmacist with each prescription and refill. Be sure to read this information carefully each time. Talk to your pediatrician regarding the use of this medicine in children. Special care may be needed. Overdosage: If you think you have taken too much of this medicine contact a poison control center or emergency room at once. NOTE: This medicine is only for you. Do not share this medicine  with others. What if I miss a dose? This does not apply. This medicine should only be taken immediately before going to sleep. Do not take double or extra doses. What may interact with this medicine? -alcohol -antihistamines for allergy, cough and cold -certain medicines for anxiety or sleep -certain medicines for depression, like amitriptyline, fluoxetine, sertraline -certain medicines for fungal infections like ketoconazole and itraconazole -certain medicines for seizures like phenobarbital, primidone -ciprofloxacin -dietary supplements for sleep, like valerian or kava kava -general anesthetics like halothane, isoflurane, methoxyflurane, propofol -local anesthetics like lidocaine, pramoxine, tetracaine -medicines that relax muscles for surgery -narcotic medicines for pain -phenothiazines like chlorpromazine, mesoridazine, prochlorperazine, thioridazine -rifampin This list may not describe all possible interactions. Give your health care provider a list of all the medicines, herbs, non-prescription drugs, or dietary supplements you use. Also tell them if you smoke, drink alcohol, or use illegal drugs. Some items may interact with your medicine. What should I watch for while using this medicine? Visit your doctor or health care professional for regular checks on your progress. Keep a regular sleep schedule by going to bed at about the same time each night. Avoid caffeine-containing drinks in the evening hours. When sleep medicines are used every night for more than a few weeks, they may stop working. Talk to your doctor if your insomnia worsens or is not better within 7 to 10 days. After taking this medicine for sleep, you may get up out of bed while not being fully awake and do an activity that you do not know you are doing. The next morning, you may have no memory of  the event. Activities such as driving a car ("sleep-driving"), making and eating food, talking on the phone, sexual activity,  and sleep-walking have been reported. Call your doctor right away if you find out you have done any of these activities. Do not take this medicine if you have used alcohol that evening or before bed or taken another medicine for sleep since your risk of doing these sleep-related activities will be increased. Do not take this medicine unless you are able to stay in bed for a full night (7 to 8 hours) before you must be active again. Do not drive, use machinery, or do anything that needs mental alertness the day after you take this medicine. You may have a decrease in mental alertness the day after use, even if you feel that you are fully awake. Tell your doctor if you will need to perform activities requiring full alertness, such as driving, the next day. Do not stand or sit up quickly, especially if you are an older patient. This reduces the risk of dizzy or fainting spells. If you or your family notice any changes in your moods or behavior, such as new or worsening depression, thoughts of harming yourself, anxiety, other unusual or disturbing thoughts, or memory loss, call your doctor right away. After you stop taking this medicine, you may have trouble falling asleep. This is called rebound insomnia. This problem usually goes away on its own after 1 or 2 nights. What side effects may I notice from receiving this medicine? Side effects that you should report to your doctor or health care professional as soon as possible: -allergic reactions like skin rash, itching or hives, swelling of the face, lips, or tongue -breathing problems -changes in vision -confusion -depressed mood or other changes in moods or emotions -feeling faint or lightheaded, falls -hallucinations -loss of balance or coordination -loss of memory -numbness or tingling of the tongue -restlessness, excitability, or feelings of anxiety or agitation -signs and symptoms of liver injury like dark yellow or brown urine; general ill  feeling or flu-like symptoms; light-colored stools; loss of appetite; nausea; right upper belly pain; unusually weak or tired; yellowing of the eyes or skin -suicidal thoughts -unusual activities while asleep like driving, eating, making phone calls, or sexual activity Side effects that usually do not require medical attention (report to your doctor or health care professional if they continue or are bothersome): -dizziness -drowsiness the day after you take this medicine -headache This list may not describe all possible side effects. Call your doctor for medical advice about side effects. You may report side effects to FDA at 1-800-FDA-1088. Where should I keep my medicine? Keep out of the reach of children. This medicine can be abused. Keep your medicine in a safe place to protect it from theft. Do not share this medicine with anyone. Selling or giving away this medicine is dangerous and against the law. This medicine may cause accidental overdose and death if taken by other adults, children, or pets. Mix any unused medicine with a substance like cat litter or coffee grounds. Then throw the medicine away in a sealed container like a sealed bag or a coffee can with a lid. Do not use the medicine after the expiration date. Store at controlled room temperature between 15 and 25 degrees C (59 and 77 degrees F). NOTE: This sheet is a summary. It may not cover all possible information. If you have questions about this medicine, talk to your doctor, pharmacist, or health care provider.  2018 Elsevier/Gold Standard (2016-02-25 14:31:21)

## 2018-01-27 NOTE — Telephone Encounter (Signed)
-----   Message from [name], [place] sent at 01/27/2018  4:49 PM EST ----- Sacrum and coccyx appear normal. There is degenerative disc disease noted in the lumbar spine and some arthritis of the sacroiliac joints which is where the sacrum and pelvis meet. I feel this may just be soft tissue or some pain possibly referring down from the lumbar spine or the SI joint arthritis as noted above. I think that you could see if Dr. Sharlet Salina or your neurosurgeon have any thoughts on this. I feel physical therapy may help. I can set that up if you wish.

## 2018-01-27 NOTE — Telephone Encounter (Signed)
Patient advised as below. Mailed out a copy of note. Per pt request.

## 2018-01-27 NOTE — Progress Notes (Signed)
Patient: Teresa Harris Female    DOB: [birth year]   77 y.o.   MRN: [id number] Visit Date: 01/27/2018  Today's Provider: [name], PA-C   Chief Complaint  Patient presents with  . Follow-up    Insomnia   Subjective:    HPI Primary insomnia: Patient is coming in to follow-up Insomnia. Last office visit insomnia was worsening. Failed trazodone, temazepam, and zolpidem 5mg  and 10mg .  Teresa Harris was prescribed. She reports the Lunesta -side effect-stomach upset. She reports she went back on taking the Ambien even thou it doesn't help but needs something at least to sleep 1-2 hours, occasionally 4 hours.  She is also complaining of a dull throbbing pain in the right gluteal region. This has been worsening over the last month. She reports she can get it calmed down with using a pillow to sit on, then will forget and sit on a hard surface, re-aggravate it, then take another 4-5 days to calm it down. It does not radiate anywhere. She denies any rash or lesion.     Allergies  Allergen Reactions  . [name] [Moxifloxacin Hcl In Nacl]   . Bimatoprost     Other reaction(s): Other (See Comments) Made my eyes red, and hurt  . Brimonidine     Other reaction(s): Other (See Comments) Made my eyes red, and hurt  . Keflex [Cephalexin]     headache  . Mobic [Meloxicam] Other (See Comments)    headache  . Moxifloxacin     Other reaction(s): Headache  . Propofol     Other reaction(s): Other (See Comments) Had difficulty waking up  . Travoprost   . Vicodin [Hydrocodone-Acetaminophen] Nausea And Vomiting    Sick on stomach Sick on stomach     Current Outpatient Medications:  .  calcium carbonate (OS-CAL) 600 MG TABS, Take 600 mg by mouth 2 (two) times daily with a meal. , Disp: , Rfl:  .  celecoxib (CELEBREX) 200 MG capsule, Take 1 capsule (200 mg total) by mouth 2 (two) times daily., Disp: 60 capsule, Rfl: 5 .  cycloSPORINE (RESTASIS) 0.05 % ophthalmic emulsion, Place 1 drop  into both eyes 2 (two) times daily. , Disp: , Rfl:  .  DIGOX 125 MCG tablet, TAKE 1 TABLET BY MOUTH ONCE EVERY MORNING, Disp: 30 tablet, Rfl: 6 .  dorzolamide-timolol (COSOPT) 22.3-6.8 MG/ML ophthalmic solution, Place 1 drop into the right eye 2 (two) times daily. , Disp: , Rfl:  .  ELIQUIS 5 MG TABS tablet, TAKE 1 TABLET BY MOUTH TWICE DAILY, Disp: 60 tablet, Rfl: 6 .  estrogens, conjugated, (PREMARIN) 0.625 MG tablet, Take 1 tablet (0.625 mg total) by mouth daily., Disp: 90 tablet, Rfl: 3 .  Eszopiclone 3 MG TABS, Take 1 tablet (3 mg total) by mouth at bedtime. Take immediately before bedtime, Disp: 30 tablet, Rfl: 0 .  Magnesium 400 MG CAPS, Take 400 mg by mouth 2 (two) times daily., Disp: , Rfl:  .  metoprolol succinate (TOPROL-XL) 25 MG 24 hr tablet, Take 0.5 tablets (12.5 mg total) by mouth daily., Disp: 90 tablet, Rfl: 1 .  Multiple Vitamin (MULTIVITAMIN) tablet, Take 1 tablet by mouth daily., Disp: , Rfl:  .  Omega-3 Fatty Acids (FISH OIL) 1200 MG CAPS, Take 1,200 mg by mouth 2 (two) times daily., Disp: , Rfl:  .  omeprazole (PRILOSEC) 40 MG capsule, Take 40 mg by mouth every morning. , Disp: , Rfl:   Review of Systems  Constitutional: Positive  for fatigue.  Respiratory: Negative for cough, chest tightness and shortness of breath.   Cardiovascular: Negative for chest pain, palpitations and leg swelling.  Gastrointestinal: Negative for abdominal distention, abdominal pain, anal bleeding, blood in stool, constipation, diarrhea, nausea, rectal pain and vomiting.  Musculoskeletal: Positive for arthralgias, myalgias, neck pain and neck stiffness.  Psychiatric/Behavioral: Positive for sleep disturbance.    Social History   Tobacco Use  . Smoking status: Former Smoker    Packs/day: 1.00    Years: 30.00    Pack years: 30.00    Types: Cigarettes    Last attempt to quit: 02/09/1996    Years since quitting: 21.9  . Smokeless tobacco: Never Used  Substance Use Topics  . Alcohol use: No    Objective:   BP 130/90 (BP Location: Left Arm, Patient Position: Sitting, Cuff Size: Normal)   Pulse 76   Temp 97.7 F (36.5 C) (Oral)   Resp 16   Wt 184 lb 9.6 oz (83.7 kg)   SpO2 97%   BMI 28.07 kg/m     Physical Exam  Constitutional: She appears well-developed and well-nourished. No distress.  Neck: Normal range of motion. Neck supple.  Cardiovascular: Normal rate, regular rhythm and normal heart sounds. Exam reveals no gallop and no friction rub.  No murmur heard. Pulmonary/Chest: Effort normal and breath sounds normal. No respiratory distress. She has no wheezes. She has no rales.  Musculoskeletal:       Back:  Skin: She is not diaphoretic.  Psychiatric: She has a normal mood and affect. Her behavior is normal. Judgment and thought content normal.  Vitals reviewed.       Assessment & Plan:     1. Primary insomnia Will try ambien CR as below. Sleep study referral made as well. If no improvements will refer to Dr. Carlena Sax in [place], sleep medicine for further evaluation. She is in agreement. She will call if ambien CR does not work either.  - zolpidem (AMBIEN CR) 12.5 MG CR tablet; Take 1 tablet (12.5 mg total) by mouth at bedtime as needed for sleep.  Dispense: 30 tablet; Refill: 0  - Ambulatory referral to Sleep Studies  2. Coccyalgia Unsure of cause but will image as below to r/o any bony abnormality. Continue cushioning seat. Moist heat prn. Celebrex prn.  - DG Sacrum/Coccyx; Future       [name], PA-C  [facility]

## 2018-01-30 ENCOUNTER — Telehealth: Payer: Self-pay | Admitting: Physician Assistant

## 2018-01-30 NOTE — Telephone Encounter (Signed)
Pt has questions regarding Zolpidem 12.5.  Pt states when she went to pick up at the pharmacy she was told she needed to be very careful taking this medication.  She is concerned she will be "out of it" when she takes this medication.

## 2018-01-30 NOTE — Telephone Encounter (Signed)
lmtcb

## 2018-01-30 NOTE — Telephone Encounter (Signed)
She has been taking zolpidem 10mg  already. This is only 2.5mg  more. She should be ok

## 2018-02-01 NOTE — Telephone Encounter (Signed)
LMTCB. Need to know if patient has filled the Zolpidem 12.5 mg yet?

## 2018-02-01 NOTE — Telephone Encounter (Signed)
Pt advised.  She states she only takes 5mg  (of Zolpidem (she cuts the tablets in half) at night.  She says "10mg  is too strong and 5mg  is not enough"  Would it be okay if she cuts 12.mg in half?  Thanks,   -Mickel Baas

## 2018-02-01 NOTE — Telephone Encounter (Signed)
Has she filled the 12.5?

## 2018-02-02 NOTE — Telephone Encounter (Signed)
Patient reports she did fill medication. Patient is willing to try one and call Monday to report any adverse reactions.

## 2018-02-14 DIAGNOSIS — M5412 Radiculopathy, cervical region: Secondary | ICD-10-CM | POA: Diagnosis not present

## 2018-02-14 DIAGNOSIS — M79601 Pain in right arm: Secondary | ICD-10-CM | POA: Diagnosis not present

## 2018-02-14 DIAGNOSIS — Z981 Arthrodesis status: Secondary | ICD-10-CM | POA: Diagnosis not present

## 2018-02-14 DIAGNOSIS — G8929 Other chronic pain: Secondary | ICD-10-CM | POA: Diagnosis not present

## 2018-02-14 DIAGNOSIS — M79602 Pain in left arm: Secondary | ICD-10-CM | POA: Diagnosis not present

## 2018-02-21 ENCOUNTER — Ambulatory Visit (INDEPENDENT_AMBULATORY_CARE_PROVIDER_SITE_OTHER): Payer: PPO | Admitting: Cardiovascular Disease

## 2018-02-21 ENCOUNTER — Encounter: Payer: Self-pay | Admitting: Cardiovascular Disease

## 2018-02-21 VITALS — BP 120/80 | HR 88 | Ht 68.0 in | Wt 182.0 lb

## 2018-02-21 DIAGNOSIS — I482 Chronic atrial fibrillation, unspecified: Secondary | ICD-10-CM

## 2018-02-21 DIAGNOSIS — I10 Essential (primary) hypertension: Secondary | ICD-10-CM

## 2018-02-21 NOTE — Patient Instructions (Signed)
Medication Instructions: Continue same medications.   Labwork: None.   Procedures/Testing: None.   Follow-Up: 6 months with Dr. Arida.   Any Additional Special Instructions Will Be Listed Below (If Applicable).     If you need a refill on your cardiac medications before your next appointment, please call your pharmacy.   

## 2018-02-21 NOTE — Progress Notes (Signed)
Cardiology Office Note   Date:  02/21/2018   ID:  Teresa Harris, DOB [birth year], MRN [id number]  PCP:  Teresa Daring, PA-C  Cardiologist:   Teresa Sacramento, MD   Chief Complaint  Patient presents with  . other    6 month f/u rapid heart beat and bilateral arm pain/numbness. Meds reviewed verbally with pt.      History of Present Illness: Teresa Harris is a 77 y.o. female who presents for a follow-up regarding chronic atrial fibrillation.  She has borderline diabetes and hyperlipidemia not requiring medications. She smoked in the past more than 30 years ago. There is no family history of coronary artery disease or arrhythmia.  Nuclear stress test in November 2015 which showed no evidence of ischemia with normal ejection fraction. Echocardiogram showed normal EF with mild to moderate mitral regurgitation and mildly dilated left atrium.  She is only able to tolerate small dose metoprolol due to extreme fatigue.  Digoxin is being used for rate control.  She has been stable from a cardiac standpoint with no recent chest pain, shortness of breath or palpitations. She is having significant right sided neck pain with right shoulder and right arm discomfort.  She is getting evaluation by neurosurgery.  She had an epidural injection with no improvement in symptoms.  Past Medical History:  Diagnosis Date  . Arthritis   . Atrial fibrillation ([place]) 10/01/14  . Complication of anesthesia    trouble waking up with Propofol  . Depression    h/o  . Dysrhythmia    a fib  . Family history of adverse reaction to anesthesia   . GERD (gastroesophageal reflux disease)   . Glaucoma    left eye  . Headache    H/O MIGRAINES  . Hemorrhoids   . Hiatal hernia   . History of migraine headaches   . Hx MRSA infection   . Hypercholesteremia   . Kidney calculi   . Osteopenia   . PONV (postoperative nausea and vomiting)     Past Surgical History:  Procedure Laterality Date  .  ABDOMINAL HYSTERECTOMY    . AUGMENTATION MAMMAPLASTY Bilateral 2004  . CARPAL TUNNEL RELEASE Right 02/16/2016   Procedure: CARPAL TUNNEL RELEASE;  Surgeon: Dereck Leep, MD;  Location: [facility] ORS;  Service: Orthopedics;  Laterality: Right;  . CARPAL TUNNEL RELEASE Left 04/12/2016   Procedure: CARPAL TUNNEL RELEASE;  Surgeon: Dereck Leep, MD;  Location: [facility] ORS;  Service: Orthopedics;  Laterality: Left;  . COLONOSCOPY  12/29/09   Dr Dionne Milo  . EYE SURGERY Left    shunt placed  . FOOT SURGERY     bilateral   . JOINT REPLACEMENT    . NASAL SEPTUM SURGERY    . REPLACEMENT TOTAL KNEE     bilateral  . SHOULDER SURGERY     right shoulder     Current Outpatient Medications  Medication Sig Dispense Refill  . calcium carbonate (OS-CAL) 600 MG TABS Take 600 mg by mouth 2 (two) times daily with a meal.     . celecoxib (CELEBREX) 200 MG capsule Take 1 capsule (200 mg total) by mouth 2 (two) times daily. 60 capsule 5  . cycloSPORINE (RESTASIS) 0.05 % ophthalmic emulsion Place 1 drop into both eyes 2 (two) times daily.     Marland Kitchen DIGOX 125 MCG tablet TAKE 1 TABLET BY MOUTH ONCE EVERY MORNING 30 tablet 6  . dorzolamide-timolol (COSOPT) 22.3-6.8 MG/ML ophthalmic solution Place 1 drop into the right  eye 2 (two) times daily.     Marland Kitchen ELIQUIS 5 MG TABS tablet TAKE 1 TABLET BY MOUTH TWICE DAILY 60 tablet 6  . estrogens, conjugated, (PREMARIN) 0.625 MG tablet Take 1 tablet (0.625 mg total) by mouth daily. 90 tablet 3  . Magnesium 400 MG CAPS Take 400 mg by mouth 2 (two) times daily.    . metoprolol succinate (TOPROL-XL) 25 MG 24 hr tablet Take 0.5 tablets (12.5 mg total) by mouth daily. 90 tablet 1  . Multiple Vitamin (MULTIVITAMIN) tablet Take 1 tablet by mouth daily.    . Omega-3 Fatty Acids (FISH OIL) 1200 MG CAPS Take 1,200 mg by mouth 2 (two) times daily.    Marland Kitchen omeprazole (PRILOSEC) 40 MG capsule Take 40 mg by mouth every morning.     . zolpidem (AMBIEN CR) 12.5 MG CR tablet Take 1 tablet (12.5 mg total)  by mouth at bedtime as needed for sleep. 30 tablet 0   No current facility-administered medications for this visit.     Allergies:   [name] [moxifloxacin hcl in nacl]; Bimatoprost; Brimonidine; Keflex [cephalexin]; Mobic [meloxicam]; Moxifloxacin; Propofol; Travoprost; and Vicodin [hydrocodone-acetaminophen]    Social History:  The patient  reports that she quit smoking about 22 years ago. Her smoking use included cigarettes. She has a 30.00 pack-year smoking history. she has never used smokeless tobacco. She reports that she does not drink alcohol or use drugs.   Family History:  The patient's family history includes Arthritis in her father and mother.    ROS:  Please see the history of present illness.   Otherwise, review of systems are positive for none.   All other systems are reviewed and negative.    PHYSICAL EXAM: VS:  BP 120/80 (BP Location: Left Arm, Patient Position: Sitting, Cuff Size: Normal)   Pulse 88   Ht 5\' 8"  (1.727 m)   Wt 182 lb (82.6 kg)   BMI 27.67 kg/m  , BMI Body mass index is 27.67 kg/m. GEN: Well nourished, well developed, in no acute distress  HEENT: normal  Neck: no JVD, carotid bruits, or masses Cardiac: Irregularly irregular ; no murmurs, rubs, or gallops,no edema  Respiratory:  clear to auscultation bilaterally, normal work of breathing GI: soft, nontender, nondistended, + BS MS: no deformity or atrophy  Skin: warm and dry, no rash Neuro:  Strength and sensation are intact Psych: euthymic mood, full affect   EKG:  EKG is ordered today. The ekg ordered today demonstrates atrial fibrillation with nonspecific ST and T wave changes.  Recent Labs: 12/30/2017: ALT 20; BUN 13; Creatinine, Ser 0.63; Hemoglobin 14.6; Platelets 226; Potassium 4.8; Sodium 141    Lipid Panel    Component Value Date/Time   CHOL 170 12/30/2017 1118   TRIG 243 (H) 12/30/2017 1118   HDL 45 12/30/2017 1118   CHOLHDL 4.0 12/21/2016 1017   LDLCALC 76 12/30/2017 1118       Wt Readings from Last 3 Encounters:  02/21/18 182 lb (82.6 kg)  01/27/18 184 lb 9.6 oz (83.7 kg)  12/30/17 183 lb 9.6 oz (83.3 kg)       ASSESSMENT AND PLAN:  1. Chronic atrial fibrillation: Ventricular rate is reasonably controlled with small dose digoxin and metoprolol. She is tolerating anticoagulation with no side effects.   I reviewed her labs done in January which were unremarkable.  Digoxin level last year was normal at 0.6.  2.  Pain management: She is having significant discomfort affecting her right shoulder and right arm  and currently undergoing further workup by neurosurgery.  Given that she is not on long-term anticoagulation, I strongly advised her to avoid NSAIDs.  If her pain is not controlled with acetaminophen, tramadol is likely a good option.  Disposition:   FU with me in 6 months  Signed,  Teresa Sacramento, MD  02/21/2018 4:22 PM    [facility]

## 2018-02-23 DIAGNOSIS — H401123 Primary open-angle glaucoma, left eye, severe stage: Secondary | ICD-10-CM | POA: Diagnosis not present

## 2018-02-28 DIAGNOSIS — G5603 Carpal tunnel syndrome, bilateral upper limbs: Secondary | ICD-10-CM | POA: Diagnosis not present

## 2018-03-08 DIAGNOSIS — L2084 Intrinsic (allergic) eczema: Secondary | ICD-10-CM | POA: Diagnosis not present

## 2018-03-08 DIAGNOSIS — J300 Vasomotor rhinitis: Secondary | ICD-10-CM | POA: Diagnosis not present

## 2018-03-09 ENCOUNTER — Ambulatory Visit
Admission: RE | Admit: 2018-03-09 | Discharge: 2018-03-09 | Disposition: A | Discharge status: Home / Self Care | Payer: PPO | Source: Ambulatory Visit | Attending: Neurosurgery | Admitting: Neurosurgery

## 2018-03-09 ENCOUNTER — Other Ambulatory Visit: Payer: Self-pay

## 2018-03-09 ENCOUNTER — Encounter
Admission: RE | Admit: 2018-03-09 | Discharge: 2018-03-09 | Disposition: A | Discharge status: Home / Self Care | Payer: PPO | Source: Ambulatory Visit | Attending: Neurosurgery | Admitting: Neurosurgery

## 2018-03-09 ENCOUNTER — Inpatient Hospital Stay: Admission: RE | Admit: 2018-03-09 | Payer: PPO | Source: Ambulatory Visit

## 2018-03-09 DIAGNOSIS — Z01812 Encounter for preprocedural laboratory examination: Secondary | ICD-10-CM | POA: Insufficient documentation

## 2018-03-09 DIAGNOSIS — Z01818 Encounter for other preprocedural examination: Secondary | ICD-10-CM | POA: Diagnosis not present

## 2018-03-09 DIAGNOSIS — Z01810 Encounter for preprocedural cardiovascular examination: Secondary | ICD-10-CM | POA: Diagnosis not present

## 2018-03-09 DIAGNOSIS — I499 Cardiac arrhythmia, unspecified: Secondary | ICD-10-CM

## 2018-03-09 DIAGNOSIS — J984 Other disorders of lung: Secondary | ICD-10-CM | POA: Diagnosis not present

## 2018-03-09 DIAGNOSIS — G5601 Carpal tunnel syndrome, right upper limb: Secondary | ICD-10-CM | POA: Insufficient documentation

## 2018-03-09 HISTORY — DX: Personal history of urinary calculi: Z87.442

## 2018-03-09 LAB — CBC
HCT: 43 % (ref 35.0–47.0)
Hemoglobin: 14.2 g/dL (ref 12.0–16.0)
MCH: 30.8 pg (ref 26.0–34.0)
MCHC: 32.9 g/dL (ref 32.0–36.0)
MCV: 93.6 fL (ref 80.0–100.0)
Platelets: 205 10*3/uL (ref 150–440)
RBC: 4.6 MIL/uL (ref 3.80–5.20)
RDW: 13.1 % (ref 11.5–14.5)
WBC: 6.4 10*3/uL (ref 3.6–11.0)

## 2018-03-09 LAB — URINALYSIS, ROUTINE W REFLEX MICROSCOPIC
Bacteria, UA: NONE SEEN
Bilirubin Urine: NEGATIVE
Glucose, UA: NEGATIVE mg/dL
Hgb urine dipstick: NEGATIVE
Ketones, ur: NEGATIVE mg/dL
Leukocytes, UA: NEGATIVE
Nitrite: NEGATIVE
Protein, ur: 30 mg/dL — AB
Specific Gravity, Urine: 1.016 (ref 1.005–1.030)
pH: 7 (ref 5.0–8.0)

## 2018-03-09 LAB — BASIC METABOLIC PANEL
Anion gap: 7 (ref 5–15)
BUN: 13 mg/dL (ref 6–20)
CO2: 25 mmol/L (ref 22–32)
Calcium: 9 mg/dL (ref 8.9–10.3)
Chloride: 104 mmol/L (ref 101–111)
Creatinine, Ser: 0.5 mg/dL (ref 0.44–1.00)
GFR calc Af Amer: >60 mL/min (ref >60)
GFR calc non Af Amer: >60 mL/min (ref >60)
Glucose, Bld: 95 mg/dL (ref 65–99)
Potassium: 4.3 mmol/L (ref 3.5–5.1)
Sodium: 136 mmol/L (ref 135–145)

## 2018-03-09 LAB — APTT: aPTT: 28 seconds (ref 24–36)

## 2018-03-09 LAB — PROTIME-INR
INR: 1.09
Prothrombin Time: 14 seconds (ref 11.4–15.2)

## 2018-03-09 LAB — SURGICAL PCR SCREEN
MRSA, PCR: NEGATIVE
Staphylococcus aureus: POSITIVE — AB

## 2018-03-09 NOTE — Patient Instructions (Signed)
Your procedure is scheduled on: 03/13/18 Report to Day Surgery. [facility]OND FLOOR To find out your arrival time please call [phone] between 1PM - 3PM on 03/10/18.  Remember: Instructions that are not followed completely may result in serious medical risk, up to and including death, or upon the discretion of your surgeon and anesthesiologist your surgery may need to be rescheduled.     _X__ 1. Do not eat food after midnight the night before your procedure.                 No gum chewing or hard candies. You may drink clear liquids up to 2 hours                 before you are scheduled to arrive for your surgery- DO not drink clear                 liquids within 2 hours of the start of your surgery.                 Clear Liquids include:  water, apple juice without pulp, clear carbohydrate                 drink such as Clearfast of Gartorade, Black Coffee or Tea (Do not add                 anything to coffee or tea).  __X__2.  On the morning of surgery brush your teeth with toothpaste and water, you                 may rinse your mouth with mouthwash if you wish.  Do not swallow any              toothpaste of mouthwash.     _X__ 3.  No Alcohol for 24 hours before or after surgery.   _X__ 4.  Do Not Smoke or use e-cigarettes For 24 Hours Prior to Your Surgery.                 Do not use any chewable tobacco products for at least 6 hours prior to                 surgery.  ____  5.  Bring all medications with you on the day of surgery if instructed.   __X__  6.  Notify your doctor if there is any change in your medical condition      (cold, fever, infections).     Do not wear jewelry, make-up, hairpins, clips or nail polish. Do not wear lotions, powders, or perfumes. You may wear deodorant. Do not shave 48 hours prior to surgery. Men may shave face and neck. Do not bring valuables to the hospital.    [facility] is not responsible for any belongings or  valuables.  Contacts, dentures or bridgework may not be worn into surgery. Leave your suitcase in the car. After surgery it may be brought to your room. For patients admitted to the hospital, discharge time is determined by your treatment team.   Patients discharged the day of surgery will not be allowed to drive home.   Please read over the following fact sheets that you were given:   Surgical Site Infection Prevention  / MRSA  __X__ Take these medicines the morning of surgery with A SIP OF WATER:    1. CELECOXIB  2. DIGOX  3. MAGNESIUM  4. OMEPRAZOLE AT BEDTIME 03/12/18 AND  AM OF SURGERY  5.  6.  ____ Fleet Enema (as directed)   _X___ Use CHG Soap as directed  ____ Use inhalers on the day of surgery  ____ Stop metformin 2 days prior to surgery    ____ Take 1/2 of usual insulin dose the night before surgery. No insulin the morning          of surgery.   _X___ Stop Coumadin/Plavix/aspirin on     LAST DOSE OF PRADAXA 03/08/18  ____ Stop Anti-inflammatories on     __X__ Stop supplements until after surgery.   STOP FISH OIL 03/09/18 UNTIL AFTER SURGERY  ____ Bring C-Pap to the hospital.

## 2018-03-10 NOTE — Pre-Procedure Instructions (Signed)
Urine results and "positive" staph results sent to Dr. Lacinda Axon for review.  Asked if wanted any treatment?

## 2018-03-13 ENCOUNTER — Ambulatory Visit
Admission: RE | Admit: 2018-03-13 | Discharge: 2018-03-13 | Disposition: A | Discharge status: Home / Self Care | Payer: PPO | Source: Ambulatory Visit | Attending: Neurosurgery | Admitting: Neurosurgery

## 2018-03-13 ENCOUNTER — Ambulatory Visit: Payer: PPO | Admitting: Certified Registered Nurse Anesthetist

## 2018-03-13 ENCOUNTER — Other Ambulatory Visit: Payer: Self-pay

## 2018-03-13 ENCOUNTER — Encounter
Admission: RE | Disposition: A | Discharge status: Home / Self Care | Payer: Self-pay | Source: Ambulatory Visit | Attending: Neurosurgery

## 2018-03-13 DIAGNOSIS — G5601 Carpal tunnel syndrome, right upper limb: Secondary | ICD-10-CM | POA: Insufficient documentation

## 2018-03-13 DIAGNOSIS — I10 Essential (primary) hypertension: Secondary | ICD-10-CM | POA: Diagnosis not present

## 2018-03-13 DIAGNOSIS — E785 Hyperlipidemia, unspecified: Secondary | ICD-10-CM | POA: Diagnosis not present

## 2018-03-13 DIAGNOSIS — Z7901 Long term (current) use of anticoagulants: Secondary | ICD-10-CM | POA: Diagnosis not present

## 2018-03-13 DIAGNOSIS — I4891 Unspecified atrial fibrillation: Secondary | ICD-10-CM | POA: Diagnosis not present

## 2018-03-13 DIAGNOSIS — Z79890 Hormone replacement therapy (postmenopausal): Secondary | ICD-10-CM | POA: Diagnosis not present

## 2018-03-13 DIAGNOSIS — K219 Gastro-esophageal reflux disease without esophagitis: Secondary | ICD-10-CM | POA: Diagnosis not present

## 2018-03-13 DIAGNOSIS — Z87891 Personal history of nicotine dependence: Secondary | ICD-10-CM | POA: Diagnosis not present

## 2018-03-13 DIAGNOSIS — Z79899 Other long term (current) drug therapy: Secondary | ICD-10-CM | POA: Insufficient documentation

## 2018-03-13 DIAGNOSIS — E7800 Pure hypercholesterolemia, unspecified: Secondary | ICD-10-CM | POA: Diagnosis not present

## 2018-03-13 DIAGNOSIS — F329 Major depressive disorder, single episode, unspecified: Secondary | ICD-10-CM | POA: Insufficient documentation

## 2018-03-13 HISTORY — PX: CARPAL TUNNEL RELEASE: SHX101

## 2018-03-13 SURGERY — CARPAL TUNNEL RELEASE
Anesthesia: General | Site: Wrist | Laterality: Right | Wound class: Clean

## 2018-03-13 MED ORDER — PROPOFOL 500 MG/50ML IV EMUL
INTRAVENOUS | Status: DC | PRN
  Administered 2018-03-13: 20 ug/kg/min via INTRAVENOUS

## 2018-03-13 MED ORDER — LIDOCAINE HCL (CARDIAC) 20 MG/ML IV SOLN
INTRAVENOUS | Status: DC | PRN
  Administered 2018-03-13: 60 mg via INTRAVENOUS

## 2018-03-13 MED ORDER — FENTANYL CITRATE (PF) 100 MCG/2ML IJ SOLN
INTRAMUSCULAR | Status: AC
  Administered 2018-03-13: 25 ug via INTRAVENOUS
  Filled 2018-03-13: qty 2

## 2018-03-13 MED ORDER — FENTANYL CITRATE (PF) 100 MCG/2ML IJ SOLN
INTRAMUSCULAR | Status: AC
  Filled 2018-03-13: qty 2

## 2018-03-13 MED ORDER — FENTANYL CITRATE (PF) 100 MCG/2ML IJ SOLN
INTRAMUSCULAR | Status: DC | PRN
  Administered 2018-03-13 (×4): 25 ug via INTRAVENOUS

## 2018-03-13 MED ORDER — DEXAMETHASONE SODIUM PHOSPHATE 10 MG/ML IJ SOLN
INTRAMUSCULAR | Status: DC | PRN
  Administered 2018-03-13: 10 mg via INTRAVENOUS

## 2018-03-13 MED ORDER — ONDANSETRON HCL 4 MG/2ML IJ SOLN
INTRAMUSCULAR | Status: DC | PRN
  Administered 2018-03-13: 4 mg via INTRAVENOUS

## 2018-03-13 MED ORDER — LIDOCAINE HCL 1 % IJ SOLN
INTRAMUSCULAR | Status: DC | PRN
  Administered 2018-03-13: 5 mL

## 2018-03-13 MED ORDER — TRAMADOL HCL 50 MG PO TABS: 50.0000 mg | ORAL_TABLET | Freq: Four times a day (QID) | ORAL | 0 refills | Status: DC | PRN

## 2018-03-13 MED ORDER — LACTATED RINGERS IV SOLN
INTRAVENOUS | Status: DC
  Administered 2018-03-13: 08:00:00 via INTRAVENOUS

## 2018-03-13 MED ORDER — FENTANYL CITRATE (PF) 100 MCG/2ML IJ SOLN
25.0000 ug | INTRAMUSCULAR | Status: AC | PRN
  Administered 2018-03-13 (×6): 25 ug via INTRAVENOUS

## 2018-03-13 MED ORDER — ONDANSETRON HCL 4 MG/2ML IJ SOLN: 4.0000 mg | Freq: Once | INTRAMUSCULAR | Status: DC | PRN

## 2018-03-13 MED ORDER — VANCOMYCIN HCL 10 G IV SOLR
1250.0000 mg | INTRAVENOUS | Status: AC
  Administered 2018-03-13: 1250 mg via INTRAVENOUS
  Filled 2018-03-13: qty 1250

## 2018-03-13 MED ORDER — LIDOCAINE HCL (PF) 1 % IJ SOLN
INTRAMUSCULAR | Status: AC
  Filled 2018-03-13: qty 30

## 2018-03-13 SURGICAL SUPPLY — 33 items
ADH SKN CLS APL DERMABOND .7 (GAUZE/BANDAGES/DRESSINGS) ×1
BNDG GAUZE 4.5X4.1 6PLY STRL (MISCELLANEOUS) ×3 IMPLANT
CANISTER SUCT 1200ML W/VALVE (MISCELLANEOUS) ×3 IMPLANT
CHLORAPREP W/TINT 26ML (MISCELLANEOUS) ×6 IMPLANT
CORD BIP STRL DISP 12FT (MISCELLANEOUS) ×3 IMPLANT
DERMABOND ADVANCED (GAUZE/BANDAGES/DRESSINGS) ×2
DERMABOND ADVANCED .7 DNX12 (GAUZE/BANDAGES/DRESSINGS) ×1 IMPLANT
ELECT CAUTERY BLADE TIP 2.5 (TIP) ×3
ELECTRODE CAUTERY BLDE TIP 2.5 (TIP) ×1 IMPLANT
FORCEPS JEWEL BIP 4-3/4 STR (INSTRUMENTS) ×3 IMPLANT
GAUZE PETRO XEROFOAM 1X8 (MISCELLANEOUS) ×3 IMPLANT
GAUZE SPONGE 4X4 12PLY STRL (GAUZE/BANDAGES/DRESSINGS) ×6 IMPLANT
GLOVE BIOGEL PI IND STRL 8 (GLOVE) ×1 IMPLANT
GLOVE BIOGEL PI INDICATOR 8 (GLOVE) ×2
GLOVE SURG SYN 8.0 (GLOVE) ×3 IMPLANT
GLOVE SURG SYN 8.0 PF PI (GLOVE) ×1 IMPLANT
GOWN STRL REUS W/ TWL LRG LVL3 (GOWN DISPOSABLE) ×2 IMPLANT
GOWN STRL REUS W/ TWL XL LVL3 (GOWN DISPOSABLE) ×1 IMPLANT
GOWN STRL REUS W/TWL LRG LVL3 (GOWN DISPOSABLE) ×6
GOWN STRL REUS W/TWL XL LVL3 (GOWN DISPOSABLE) ×3
KIT TURNOVER KIT A (KITS) ×3 IMPLANT
NS IRRIG 1000ML POUR BTL (IV SOLUTION) ×3 IMPLANT
PACK EXTREMITY ARMC (MISCELLANEOUS) ×3 IMPLANT
SOL PREP PVP 2OZ (MISCELLANEOUS) ×3
SOLUTION PREP PVP 2OZ (MISCELLANEOUS) ×1 IMPLANT
STOCKINETTE 48X4 2 PLY STRL (GAUZE/BANDAGES/DRESSINGS) ×1 IMPLANT
STOCKINETTE STRL 4IN 9604848 (GAUZE/BANDAGES/DRESSINGS) ×3 IMPLANT
SUT ETHILON 3-0 FS-10 30 BLK (SUTURE) ×3
SUT VIC AB 2-0 SH 27 (SUTURE) ×9
SUT VIC AB 2-0 SH 27XBRD (SUTURE) ×3 IMPLANT
SUT VIC AB 3-0 SH 27 (SUTURE) ×3
SUT VIC AB 3-0 SH 27X BRD (SUTURE) ×1 IMPLANT
SUTURE EHLN 3-0 FS-10 30 BLK (SUTURE) ×1 IMPLANT

## 2018-03-13 NOTE — Progress Notes (Signed)
Pharmacy consult for Antibiotic dosing for Surgical Prophylaxis  77 yo F ordered Vancomycin for surgical prophylaxis  Wt 82.6 kg  Ht 68 in   Scr 0.50 on 03/09/18  Will order Vancomycin 15 mg/kg IV x 1 (1250 mg) as pre-op dose  Chinita Greenland PharmD Clinical Pharmacist 03/13/2018

## 2018-03-13 NOTE — Op Note (Signed)
SURGERY DATE:03/13/2018  PRE-OP DIAGNOSIS: Carpal tunnel syndrome of right wrist [G56.01]   POST-OP DIAGNOSIS:Post-Op Diagnosis Codes: * Carpal tunnel syndrome of right wrist [G56.01]  Procedure(s): NEUROPLASTY AND/OR TRANSPOSITION; MEDIAN NERVE AT CARPAL TUNNEL (Right) Revision surgery  SURGEON: Surgeon(s) and Role: Malen Gauze, MD - Primary       Marin Olp, [place] - Assisting  ANESTHESIA:Monitor Anesthesia Care  OPERATIVE FINDINGS:Compressive ligament/scar tissue at right wrist  OPERATIVE REPORT: Indication Ms. Coots was seen in clinic on3/12 and found to have recurrent and worsening bilateralhand numbness and pain. An EMG/ NCS showed bilateral carpal tunnel syndrome. This was interfering with herdaily lifestyle. Shehad failed conservative management previously and underwent decompression in 2017.She had also had injections earlier. The risks of surgery including numbness and weakness in hand, pain, infection, and hematoma were discussed. She understood there is increased risk given the prior surgery. She elected to proceed with surgery  Procedure The patient was transferred to the operating room. The patient was given preoperative prophylactic IV antibiotics. For anesthesia, IV conscious sedation was delivered by the anesthesia service.The patient was positioned supine, positioned with therightarm extended resting on an armboard. All pressure points were carefully padded. The planned incision was demarcated based at the wrist at the volar crease of the righthand and the interspace between the third and fourth digit. A TIME OUT was performed   The entireright arm was prepped and draped in standard sterile technique. The previous incision was marked and extended proximally. This was opened sharply and scar tissue seen. The incision was deepened proximally to expose perineural fat and the nerve was identified. There was considerable overlying  ligament here that was thickened. The nerve was followed distally, taking care to dissect free from the scar tissue overlying this. This took considerable time to remove adhesions. The epineurium was followed and seen to be free after lysis. The median nerve was preserved intact throughout the entire procedure. A blunt dissector was passed proximally and distally to ensure that the entire length of the flexor retinaculum had been incised and the nerve decompressed. There were no other points of nerve compression.   The wound was irrigated with antibiotic saline solution until clear and hemostasis was meticulously achieved with bipolar electrocautery. The skin was closed with a interrupted 3-0 nylon with vertical mattress sutures. The incision was dressed in a clean dry sterile dressing. All sponge counts, needle counts, and instrument counts were correct at the end of the case x 2. All pressure points remained padded throughout the entire case. The patient tolerated the procedure well without any complications and was transferred in stable condition to the PACU.    ESTIMATED BLOOD LOSS:5 cc   SPECIMENS: None  IMPLANT None  ATTESTATION  I performed the case in its entiretywith assistance of PA, Corrie Mckusick, [place]

## 2018-03-13 NOTE — Anesthesia Post-op Follow-up Note (Signed)
Anesthesia QCDR form completed.        

## 2018-03-13 NOTE — H&P (Signed)
Anitta Tenny Stahlecker is an 77 y.o. female.   Chief Complaint: bilateral hand numbness and pain HPI:  Ms. Pesola is here for evaluation of ongoing neck and arm symptoms.  She states she did have a carpal tunnel release performed on her bilateral hands in 2017.  She was experiencing some numbness prior to this surgery and it did get better afterwards.  She states she has had some chronic neck pain over the last couple years will radiate towards her shoulders.  More recently in the last couple months, she is started having pain in both arms and will travel down to her hands and is noted diffusely throughout all her fingers.  She is also noted numbness in bilateral fingertips.  She is right-handed and she feels that she is losing strength and unable to perform fine motor tasks such as picking up coins and holding paper.  She notes the pain in her arms will wake her up at nighttime but is also constant throughout the day.  She has had 2 cervical steroid injections which improved her symptoms for 1 day each.  She does not note any difficulty walking or numbness in her legs, however she does have some chronic sciatic pain in her right leg.  She had an EMG that showed grade III carpal tunnel syndrome    Past Medical History:  Diagnosis Date  . Arthritis   . Atrial fibrillation ([place]) 10/01/14  . Complication of anesthesia    trouble waking up with Propofol  . Depression    h/o  . Dysrhythmia    a fib  . Family history of adverse reaction to anesthesia    daughter PONV  . Glaucoma    left eye  . Headache    H/O MIGRAINES  . Hemorrhoids   . Hiatal hernia   . History of kidney stones   . History of migraine headaches   . Hx MRSA infection   . Hypercholesteremia   . Kidney calculi   . Osteopenia   . PONV (postoperative nausea and vomiting)     Past Surgical History:  Procedure Laterality Date  . ABDOMINAL HYSTERECTOMY    . AUGMENTATION MAMMAPLASTY Bilateral 2004  . CARPAL TUNNEL RELEASE  Right 02/16/2016   Procedure: CARPAL TUNNEL RELEASE;  Surgeon: Dereck Leep, MD;  Location: [facility] ORS;  Service: Orthopedics;  Laterality: Right;  . CARPAL TUNNEL RELEASE Left 04/12/2016   Procedure: CARPAL TUNNEL RELEASE;  Surgeon: Dereck Leep, MD;  Location: [facility] ORS;  Service: Orthopedics;  Laterality: Left;  . COLONOSCOPY  12/29/09   Dr Dionne Milo  . EYE SURGERY Left    shunt placed  . FOOT SURGERY     bilateral   . JOINT REPLACEMENT    . NASAL SEPTUM SURGERY    . REPLACEMENT TOTAL KNEE     bilateral  . SHOULDER SURGERY     right shoulder    Family History  Problem Relation Age of Onset  . Arthritis Mother   . Arthritis Father    Social History:  reports that she quit smoking about 22 years ago. Her smoking use included cigarettes. She has a 30.00 pack-year smoking history. She has never used smokeless tobacco. She reports that she does not drink alcohol or use drugs.  Allergies:  Allergies  Allergen Reactions  . [name] [Moxifloxacin Hcl In Nacl] Other (See Comments)    Headache  . Bimatoprost Other (See Comments)    Made my eyes red, and hurt  . Brimonidine Other (See  Comments)    Made my eyes red, and hurt  . Keflex [Cephalexin] Other (See Comments)    headache  . Mobic [Meloxicam] Other (See Comments)    headache  . Propofol Other (See Comments)    Had difficulty waking up  . Travoprost Other (See Comments)    Unknown  . Vicodin [Hydrocodone-Acetaminophen] Nausea And Vomiting and Other (See Comments)    Sick on stomach    Medications Prior to Admission  Medication Sig Dispense Refill  . acetaminophen (TYLENOL) 500 MG tablet Take 500-1,000 mg by mouth every 6 (six) hours as needed for moderate pain or headache.    . Calcium Carbonate-Vitamin D (CALCIUM-VITAMIN D3 PO) Take 1 tablet by mouth 2 (two) times daily.    . celecoxib (CELEBREX) 200 MG capsule Take 1 capsule (200 mg total) by mouth 2 (two) times daily. (Patient taking differently: Take 200 mg by mouth  daily. ) 60 capsule 5  . cholecalciferol (VITAMIN D) 400 units TABS tablet Take 400 Units by mouth daily.    . cycloSPORINE (RESTASIS) 0.05 % ophthalmic emulsion Place 1 drop into both eyes 2 (two) times daily.     Marland Kitchen DIGOX 125 MCG tablet TAKE 1 TABLET BY MOUTH ONCE EVERY MORNING (Patient taking differently: TAKE 125 MCG BY MOUTH ONCE EVERY MORNING) 30 tablet 6  . dorzolamide-timolol (COSOPT) 22.3-6.8 MG/ML ophthalmic solution Place 1 drop into both eyes 2 (two) times daily.     Marland Kitchen ELIQUIS 5 MG TABS tablet TAKE 1 TABLET BY MOUTH TWICE DAILY (Patient taking differently: TAKE 5 MG BY MOUTH TWICE DAILY) 60 tablet 6  . estrogens, conjugated, (PREMARIN) 0.625 MG tablet Take 1 tablet (0.625 mg total) by mouth daily. 90 tablet 3  . Magnesium 400 MG CAPS Take 400 mg by mouth 2 (two) times daily.    . metoprolol succinate (TOPROL-XL) 25 MG 24 hr tablet Take 0.5 tablets (12.5 mg total) by mouth daily. 90 tablet 1  . Multiple Vitamin (MULTIVITAMIN) tablet Take 1 tablet by mouth daily.    . Omega-3 Fatty Acids (FISH OIL) 1200 MG CAPS Take 1,200 mg by mouth daily.     Marland Kitchen omeprazole (PRILOSEC) 40 MG capsule Take 40 mg by mouth every morning.     . zolpidem (AMBIEN) 5 MG tablet Take 5 mg by mouth at bedtime.       No results found for this or any previous visit (from the past 48 hour(s)). No results found.  ROS  General ROS: Negative Psychological ROS: Negative Ophthalmic ROS: Negative ENT ROS: Negative Hematological and Lymphatic ROS: Negative  Endocrine ROS: Negative Respiratory ROS: Negative Cardiovascular ROS: Negative Gastrointestinal ROS: Negative Genito-Urinary ROS: Negative Musculoskeletal ROS: Positive for neck pain Neurological ROS: Positive for bilateral arm pain, numbness, weakness Dermatological ROS: Negative    Height 5\' 8"  (1.727 m), weight 82.6 kg (182 lb). Physical Exam  General appearance: Alert, cooperative, in no acute distress Head: Normocephalic, atraumatic Eyes: Normal,  EOM intact Oropharynx: Moist without lesions Neck: Supple, some decreased range of motion noted Pulm: Normal effort CV: atrial fibrillation Ext: No edema in LE bilaterally, warm extremities  Neurologic exam:  Mental status: alertness: alert, affect: normal Speech: fluent and clear Motor: 5 out of 5 strength in bilateral deltoid, bicep, tricep.  She is 5 out of 5 in left wrist extension and 4+ out of 5 in right wrist extension.  APB appears 4+ out of 5 bilaterally.  She has decreased grip with 4 out of 5 bilaterally.  Interossei appear  5 out of 5. 5 out of 5 strength noted in bilateral lower extremity's in all motor groups Sensory: Decreased light touch over bilateral hands in all fingertips and interspaces.  Intact in proximal arms and throughout lower extremities Reflexes: 1+ and symmetrical bilateral bicep, brachioradialis, patella.  Negative Hoffmann's, no clonus at ankle    Assessment/Plan Plan for right carpal tunnel release  Deetta Perla, MD 03/13/2018, 8:31 AM

## 2018-03-13 NOTE — Interval H&P Note (Signed)
History and Physical Interval Note:  03/13/2018 8:34 AM  Teresa Harris  has presented today for surgery, with the diagnosis of CARPAL TUNNEL RIGHT HAND  The various methods of treatment have been discussed with the patient and family. After consideration of risks, benefits and other options for treatment, the patient has consented to  Procedure(s): CARPAL TUNNEL RELEASE (Right) as a surgical intervention .  The patient's history has been reviewed, patient examined, no change in status, stable for surgery.  I have reviewed the patient's chart and labs.  Questions were answered to the patient's satisfaction.     Deetta Perla

## 2018-03-13 NOTE — Discharge Instructions (Addendum)
NEUROSURGERY DISCHARGE INSTRUCTIONS  Admission Diagnosis: Carpal Tunnel Syndrome  Discharge Diagnosis: Carpal tunnel syndrome  Operative procedure: Carpal tunnel release  The following are instructions to help in your recovery once you have been discharged from the hospital. Even if you feel well, it is important that you follow these activity guidelines.  What to do after you leave the hospital:  Recommended diet:  Increase protein intake to promote wound healing. You may return to your usual diet. Be sure to stay hydrated.   Recommended activity:  Increase physical activity slowly as tolerated.  Wrist and finger exercises as instructed.   Until released by your doctor, you should not return to work or school. You should rest at home and let your body heal.   Do not take a tub bath or go swimming until approved by your doctor at your follow-up appointment.   If you smoke, we strongly recommend that you quit. Smoking has been proven to interfere with normal bone healing and will dramatically reduce the success rate of your surgery. Please contact [facility] (800-QUIT-NOW) and use the resources at www.[facility].com for assistance in stopping smoking.   Medications  Do not restart Aspirin until seven days after surgery You may resume Celebrex 24 hours following procedure You may resume Eliquis 5 days following procedure   You may take anti-inflammatory medications for pain.  You may restart home medications.  Wound Care Instructions  If you have a dressing on your incision, remove it two days after your surgery. Keep your incision area clean and dry.   If you have staples or stitches on your incision, you should have a follow up scheduled for removal. If you do not have staples or stitches, you will have steri-strips (small pieces of surgical tape) or Dermabond glue. The steri-strips/glue should begin to peel away within about a week (it is fine if the steri-strips fall off before  then). If the strips are still in place one week after your surgery, you may gently remove them.    Please Report any of the following:  Should you experience any of the following, contact us immediately:   New numbness or weakness   Pain that is progressively getting worse, and is not relieved by your pain medication, muscle relaxers, rest, and warm compresses   Bleeding, redness, swelling, pain, or drainage from surgical incision   Chills or flu-like symptoms   Fever greater than 101.0 F (38.3 C)   Inability to eat, drink fluids, or take medications   Problems with bowel or bladder functions   Difficulty breathing or shortness of breath   Warmth, tenderness, or swelling in your calf    Additional Follow up appointments During office hours (Monday-Friday 9 am to 5 pm), please call your physician at [phone]  After hours and weekends, please call the [facility] Operator at  [phone] and ask for the Neurosurgeon On Call   For a life-threatening emergency, call 911    Please see below for scheduled appointments:    AMBULATORY SURGERY  DISCHARGE INSTRUCTIONS   1) The drugs that you were given will stay in your system until tomorrow so for the next 24 hours you should not:  A) Drive an automobile B) Make any legal decisions C) Drink any alcoholic beverage   2) You may resume regular meals tomorrow.  Today it is better to start with liquids and gradually work up to solid foods.  You may eat anything you prefer, but it is better to start with  liquids, then soup and crackers, and gradually work up to solid foods.   3) Please notify your doctor immediately if you have any unusual bleeding, trouble breathing, redness and pain at the surgery site, drainage, fever, or pain not relieved by medication.    4) Additional Instructions:        Please contact your physician with any problems or Same Day Surgery at [phone], Monday through Friday 6 am  to 4 pm, or [place] at [facility] number at [phone].

## 2018-03-13 NOTE — Anesthesia Postprocedure Evaluation (Signed)
Anesthesia Post Note  Patient: Teresa Harris Vital  Procedure(s) Performed: CARPAL TUNNEL RELEASE (Right Wrist)  Patient location during evaluation: PACU Anesthesia Type: General Level of consciousness: awake and alert Pain management: pain level controlled Vital Signs Assessment: post-procedure vital signs reviewed and stable Respiratory status: spontaneous breathing and respiratory function stable Cardiovascular status: stable Anesthetic complications: no     Last Vitals:  Vitals:   03/13/18 1001 03/13/18 1016  BP:  (!) 150/83  Pulse:  73  Resp:  17  Temp:    SpO2: 100% 96%    Last Pain:  Vitals:   03/13/18 1025  TempSrc:   PainSc: Asleep                 Keona Bilyeu K

## 2018-03-13 NOTE — Transfer of Care (Signed)
Immediate Anesthesia Transfer of Care Note  Patient: Teresa Harris  Procedure(s) Performed: CARPAL TUNNEL RELEASE (Right Wrist)  Patient Location: PACU  Anesthesia Type:MAC  Level of Consciousness: awake, alert , oriented and patient cooperative  Airway & Oxygen Therapy: Patient Spontanous Breathing and Patient connected to face mask oxygen  Post-op Assessment: Report given to RN, Post -op Vital signs reviewed and stable and Patient moving all extremities  Post vital signs: stable  Last Vitals:  Vitals Value Taken Time  BP 133/83 03/13/2018  9:46 AM  Temp 35.8 C 03/13/2018  9:46 AM  Pulse 76 03/13/2018  9:49 AM  Resp 19 03/13/2018  9:49 AM  SpO2 100 % 03/13/2018  9:49 AM  Vitals shown include unvalidated device data.  Last Pain:  Vitals:   03/13/18 0857  TempSrc: Oral  PainSc:          Complications: No apparent anesthesia complications

## 2018-03-13 NOTE — Anesthesia Preprocedure Evaluation (Signed)
Anesthesia Evaluation  Patient identified by MRN, date of birth, ID band Patient awake    Reviewed: Allergy & Precautions, NPO status , Patient's Chart, lab work & pertinent test results, reviewed documented beta blocker date and time   History of Anesthesia Complications (+) PONV and PROLONGED EMERGENCE  Airway Mallampati: III       Dental   Pulmonary neg sleep apnea, neg COPD, former smoker,           Cardiovascular hypertension, Pt. on medications and Pt. on home beta blockers (-) Past MI and (-) CHF + dysrhythmias Atrial Fibrillation (-) Valvular Problems/Murmurs     Neuro/Psych neg Seizures Depression    GI/Hepatic Neg liver ROS, hiatal hernia, GERD  Medicated,  Endo/Other  neg diabetes  Renal/GU Renal disease (stones)     Musculoskeletal   Abdominal   Peds  Hematology   Anesthesia Other Findings   Reproductive/Obstetrics                             Anesthesia Physical Anesthesia Plan  ASA: III  Anesthesia Plan: MAC   Post-op Pain Management:    Induction:   PONV Risk Score and Plan:   Airway Management Planned: Nasal Cannula  Additional Equipment:   Intra-op Plan:   Post-operative Plan:   Informed Consent: I have reviewed the patients History and Physical, chart, labs and discussed the procedure including the risks, benefits and alternatives for the proposed anesthesia with the patient or authorized representative who has indicated his/her understanding and acceptance.     Plan Discussed with:   Anesthesia Plan Comments:         Anesthesia Quick Evaluation

## 2018-03-15 ENCOUNTER — Telehealth: Payer: Self-pay | Admitting: Physician Assistant

## 2018-03-15 NOTE — Telephone Encounter (Signed)
FYI-Pt refused sleep study that was ordered Feb 2019.She told [facility] she was in to much pain at the time and would contact them when feeling better.As of 03/15/18 pt has not contacted their office

## 2018-03-15 NOTE — Telephone Encounter (Signed)
Noted  

## 2018-03-22 DIAGNOSIS — H401123 Primary open-angle glaucoma, left eye, severe stage: Secondary | ICD-10-CM | POA: Diagnosis not present

## 2018-04-19 DIAGNOSIS — H401111 Primary open-angle glaucoma, right eye, mild stage: Secondary | ICD-10-CM | POA: Diagnosis not present

## 2018-04-19 DIAGNOSIS — H401123 Primary open-angle glaucoma, left eye, severe stage: Secondary | ICD-10-CM | POA: Insufficient documentation

## 2018-04-19 DIAGNOSIS — H25813 Combined forms of age-related cataract, bilateral: Secondary | ICD-10-CM | POA: Diagnosis not present

## 2018-04-19 DIAGNOSIS — H04123 Dry eye syndrome of bilateral lacrimal glands: Secondary | ICD-10-CM | POA: Diagnosis not present

## 2018-05-02 DIAGNOSIS — M25631 Stiffness of right wrist, not elsewhere classified: Secondary | ICD-10-CM | POA: Diagnosis not present

## 2018-05-02 DIAGNOSIS — Z9889 Other specified postprocedural states: Secondary | ICD-10-CM | POA: Diagnosis not present

## 2018-05-02 DIAGNOSIS — M79641 Pain in right hand: Secondary | ICD-10-CM | POA: Diagnosis not present

## 2018-05-02 DIAGNOSIS — R29898 Other symptoms and signs involving the musculoskeletal system: Secondary | ICD-10-CM | POA: Diagnosis not present

## 2018-05-10 DIAGNOSIS — Z9889 Other specified postprocedural states: Secondary | ICD-10-CM | POA: Diagnosis not present

## 2018-05-10 DIAGNOSIS — M79641 Pain in right hand: Secondary | ICD-10-CM | POA: Diagnosis not present

## 2018-05-11 ENCOUNTER — Encounter: Payer: Self-pay | Admitting: Physician Assistant

## 2018-05-11 ENCOUNTER — Ambulatory Visit (INDEPENDENT_AMBULATORY_CARE_PROVIDER_SITE_OTHER): Payer: PPO | Admitting: Physician Assistant

## 2018-05-11 VITALS — BP 122/80 | HR 62 | Temp 97.9°F | Resp 16 | Wt 180.2 lb

## 2018-05-11 DIAGNOSIS — F5101 Primary insomnia: Secondary | ICD-10-CM | POA: Diagnosis not present

## 2018-05-11 DIAGNOSIS — G8929 Other chronic pain: Secondary | ICD-10-CM

## 2018-05-11 DIAGNOSIS — G629 Polyneuropathy, unspecified: Secondary | ICD-10-CM | POA: Diagnosis not present

## 2018-05-11 DIAGNOSIS — M5442 Lumbago with sciatica, left side: Secondary | ICD-10-CM

## 2018-05-11 DIAGNOSIS — M533 Sacrococcygeal disorders, not elsewhere classified: Secondary | ICD-10-CM | POA: Diagnosis not present

## 2018-05-11 DIAGNOSIS — M5416 Radiculopathy, lumbar region: Secondary | ICD-10-CM

## 2018-05-11 MED ORDER — PREGABALIN 50 MG PO CAPS: 50.0000 mg | ORAL_CAPSULE | Freq: Every day | ORAL | 0 refills | Status: DC

## 2018-05-11 NOTE — Progress Notes (Signed)
Patient: Teresa Harris Female    DOB: Aug 17, 1941   77 y.o.   MRN: [id number] Visit Date: 05/11/2018  Today's Provider: [name], PA-C   Chief Complaint  Patient presents with  . Leg Pain   Subjective:     Patient here today with c/o of pain in legs and feet and trouble sleeping.  HPI  Leg Pain: Reports the leg pain is something chronic and is worsening. She also reports that her pain on her feet is chronic but worsening. She reports that her restless legs are the cause of her not sleeping well.  Trouble Sleeping: Patient with hx of insomnia. Patient has failed trazodone, temazepam, and zolpidem 5mg  and 10mg .  Johnnye Sima was prescribed. She reports the Lunesta -side effect-stomach upset She also reported 3 months ago going back on Ambien even thou it doesn't helped but needs something at least to sleep 1-2 hours, occasionally 4 hours. A prescription was sent for patient to try Ambien CR and a sleep study referral was made as well.Per patient Ambien CR was very strong for her. She reports her sleep study was rescheduled and she was not able to go because she was pain in your hands and they advised her not to come.Per patient she needs a new referral for the sleep study again. Reports that she started back on Ambien 5 mg prn 2 weeks ago, but reports she wants to come off of it. Reports that when she had surgery in July she was able to sleep with out any sleeping meds.  Stomach problems: Reports that every time she eats she goes straight to the bathroom. Reports that her bowel movements have been runny to small "balls" in the past two month and it smell really bad. Treatment tried: none.    Allergies  Allergen Reactions  . [name] [Moxifloxacin Hcl In Nacl] Other (See Comments)    Headache  . Bimatoprost Other (See Comments)    Made my eyes red, and hurt  . Brimonidine Other (See Comments)    Made my eyes red, and hurt  . Keflex [Cephalexin] Other (See Comments)   headache  . Mobic [Meloxicam] Other (See Comments)    headache  . Propofol Other (See Comments)    Had difficulty waking up  . Travoprost Other (See Comments)    Unknown  . Vicodin [Hydrocodone-Acetaminophen] Nausea And Vomiting and Other (See Comments)    Sick on stomach     Current Outpatient Medications:  .  Calcium Carbonate-Vitamin D (CALCIUM-VITAMIN D3 PO), Take 1 tablet by mouth 2 (two) times daily., Disp: , Rfl:  .  celecoxib (CELEBREX) 200 MG capsule, Take 1 capsule (200 mg total) by mouth 2 (two) times daily. (Patient taking differently: Take 200 mg by mouth daily. ), Disp: 60 capsule, Rfl: 5 .  cholecalciferol (VITAMIN D) 400 units TABS tablet, Take 400 Units by mouth daily., Disp: , Rfl:  .  cycloSPORINE (RESTASIS) 0.05 % ophthalmic emulsion, Place 1 drop into both eyes 2 (two) times daily. , Disp: , Rfl:  .  [place] 125 MCG tablet, TAKE 1 TABLET BY MOUTH ONCE EVERY MORNING (Patient taking differently: TAKE 125 MCG BY MOUTH ONCE EVERY MORNING), Disp: 30 tablet, Rfl: 6 .  dorzolamide-timolol (COSOPT) 22.3-6.8 MG/ML ophthalmic solution, Place 1 drop into both eyes 2 (two) times daily. , Disp: , Rfl:  .  ELIQUIS 5 MG TABS tablet, TAKE 1 TABLET BY MOUTH TWICE DAILY (Patient taking differently: TAKE 5 MG BY  MOUTH TWICE DAILY), Disp: 60 tablet, Rfl: 6 .  estrogens, conjugated, (PREMARIN) 0.625 MG tablet, Take 1 tablet (0.625 mg total) by mouth daily., Disp: 90 tablet, Rfl: 3 .  Magnesium 400 MG CAPS, Take 400 mg by mouth 2 (two) times daily., Disp: , Rfl:  .  metoprolol succinate (TOPROL-XL) 25 MG 24 hr tablet, Take 0.5 tablets (12.5 mg total) by mouth daily., Disp: 90 tablet, Rfl: 1 .  Multiple Vitamin (MULTIVITAMIN) tablet, Take 1 tablet by mouth daily., Disp: , Rfl:  .  omeprazole (PRILOSEC) 40 MG capsule, Take 40 mg by mouth every morning. , Disp: , Rfl:  .  acetaminophen (TYLENOL) 500 MG tablet, Take 500-1,000 mg by mouth every 6 (six) hours as needed for moderate pain or headache.,  Disp: , Rfl:  .  traMADol (ULTRAM) 50 MG tablet, Take 1 tablet (50 mg total) by mouth every 6 (six) hours as needed for severe pain. (Patient not taking: Reported on 05/11/2018), Disp: 15 tablet, Rfl: 0 .  zolpidem (AMBIEN) 5 MG tablet, Take 5 mg by mouth at bedtime. , Disp: , Rfl:   Review of Systems  Constitutional: Positive for fatigue.  Respiratory: Negative.   Cardiovascular: Negative.   Gastrointestinal: Negative.   Musculoskeletal: Positive for arthralgias and myalgias.  Neurological: Negative.   Psychiatric/Behavioral: Positive for sleep disturbance.    Social History   Tobacco Use  . Smoking status: Former Smoker    Packs/day: 1.00    Years: 30.00    Pack years: 30.00    Types: Cigarettes    Last attempt to quit: 02/09/1996    Years since quitting: 22.2  . Smokeless tobacco: Never Used  Substance Use Topics  . Alcohol use: No   Objective:   BP 122/80 (BP Location: Left Arm, Patient Position: Sitting, Cuff Size: Normal)   Pulse 62   Temp 97.9 F (36.6 C) (Oral)   Resp 16   Wt 180 lb 3.2 oz (81.7 kg)   SpO2 98%   BMI 27.40 kg/m    Physical Exam  Constitutional: She appears well-developed and well-nourished. No distress.  Neck: Normal range of motion. Neck supple. No JVD present. No tracheal deviation present. No thyromegaly present.  Cardiovascular: Normal rate, regular rhythm and normal heart sounds. Exam reveals no gallop and no friction rub.  No murmur heard. Pulmonary/Chest: Effort normal and breath sounds normal. No respiratory distress. She has no wheezes. She has no rales.  Lymphadenopathy:    She has no cervical adenopathy.  Skin: She is not diaphoretic.  Vitals reviewed.       Assessment & Plan:     1. Neuropathy Will try Lyrica as below to take at bedtime to se if this will help the RLS and neuropathic pain she has in her lower extremities at bedtime. These may also help her sleep due to drowsiness side effect. She is not to take other sleep aids  with this. Referral placed back to Dr. Sharlet Salina. Patient has not been seen by him in one year but is wanting to re-establish care with him. I will see her back in 2 weeks to see how she is sleeping and how she is tolerating Lyrica. May consider changing to Requip if ineffective.  - pregabalin (LYRICA) 50 MG capsule; Take 1 capsule (50 mg total) by mouth at bedtime. May increase to 2 tabs PO q hs if needed after 1-2 weeks  Dispense: 60 capsule; Refill: 0 - Ambulatory referral to Physical Medicine Rehab  2. Left lumbar radiculitis  See above medical treatment plan. - Ambulatory referral to Physical Medicine Rehab  3. Chronic left-sided low back pain with left-sided sciatica See above medical treatment plan. - Ambulatory referral to Physical Medicine Rehab  4. Coccyalgia See above medical treatment plan. - Ambulatory referral to Physical Medicine Rehab  5. Primary insomnia See above medical treatment plan.       [name], PA-C  [facility]

## 2018-05-11 NOTE — Patient Instructions (Signed)
Pregabalin capsules What is this medicine? PREGABALIN (pre GAB a lin) is used to treat nerve pain from diabetes, shingles, spinal cord injury, and fibromyalgia. It is also used to control seizures in epilepsy. This medicine may be used for other purposes; ask your health care provider or pharmacist if you have questions. COMMON BRAND NAME(S): Lyrica What should I tell my health care provider before I take this medicine? They need to know if you have any of these conditions: -bleeding problems -heart disease, including heart failure -history of alcohol or drug abuse -kidney disease -suicidal thoughts, plans, or attempt; a previous suicide attempt by you or a family member -an unusual or allergic reaction to pregabalin, gabapentin, other medicines, foods, dyes, or preservatives -pregnant or trying to get pregnant or trying to conceive with your partner -breast-feeding How should I use this medicine? Take this medicine by mouth with a glass of water. Follow the directions on the prescription label. You can take this medicine with or without food. Take your doses at regular intervals. Do not take your medicine more often than directed. Do not stop taking except on your doctor's advice. A special MedGuide will be given to you by the pharmacist with each prescription and refill. Be sure to read this information carefully each time. Talk to your pediatrician regarding the use of this medicine in children. Special care may be needed. Overdosage: If you think you have taken too much of this medicine contact a poison control center or emergency room at once. NOTE: This medicine is only for you. Do not share this medicine with others. What if I miss a dose? If you miss a dose, take it as soon as you can. If it is almost time for your next dose, take only that dose. Do not take double or extra doses. What may interact with this medicine? -alcohol -certain medicines for blood pressure like captopril,  enalapril, or lisinopril -certain medicines for diabetes, like pioglitazone or rosiglitazone -certain medicines for anxiety or sleep -narcotic medicines for pain This list may not describe all possible interactions. Give your health care provider a list of all the medicines, herbs, non-prescription drugs, or dietary supplements you use. Also tell them if you smoke, drink alcohol, or use illegal drugs. Some items may interact with your medicine. What should I watch for while using this medicine? Tell your doctor or healthcare professional if your symptoms do not start to get better or if they get worse. Visit your doctor or health care professional for regular checks on your progress. Do not stop taking except on your doctor's advice. You may develop a severe reaction. Your doctor will tell you how much medicine to take. Wear a medical identification bracelet or chain if you are taking this medicine for seizures, and carry a card that describes your disease and details of your medicine and dosage times. You may get drowsy or dizzy. Do not drive, use machinery, or do anything that needs mental alertness until you know how this medicine affects you. Do not stand or sit up quickly, especially if you are an older patient. This reduces the risk of dizzy or fainting spells. Alcohol may interfere with the effect of this medicine. Avoid alcoholic drinks. If you have a heart condition, like congestive heart failure, and notice that you are retaining water and have swelling in your hands or feet, contact your health care provider immediately. The use of this medicine may increase the chance of suicidal thoughts or actions. Pay special attention   to how you are responding while on this medicine. Any worsening of mood, or thoughts of suicide or dying should be reported to your health care professional right away. This medicine has caused reduced sperm counts in some men. This may interfere with the ability to father a  child. You should talk to your doctor or health care professional if you are concerned about your fertility. Women who become pregnant while using this medicine for seizures may enroll in the [place] Antiepileptic Drug Pregnancy Registry by calling 1-888-233-2334. This registry collects information about the safety of antiepileptic drug use during pregnancy. What side effects may I notice from receiving this medicine? Side effects that you should report to your doctor or health care professional as soon as possible: -allergic reactions like skin rash, itching or hives, swelling of the face, lips, or tongue -breathing problems -changes in vision -chest pain -confusion -jerking or unusual movements of any part of your body -loss of memory -muscle pain, tenderness, or weakness -suicidal thoughts or other mood changes -swelling of the ankles, feet, hands -unusual bruising or bleeding Side effects that usually do not require medical attention (report to your doctor or health care professional if they continue or are bothersome): -dizziness -drowsiness -dry mouth -headache -nausea -tremors -trouble sleeping -weight gain This list may not describe all possible side effects. Call your doctor for medical advice about side effects. You may report side effects to FDA at 1-800-FDA-1088. Where should I keep my medicine? Keep out of the reach of children. This medicine can be abused. Keep your medicine in a safe place to protect it from theft. Do not share this medicine with anyone. Selling or giving away this medicine is dangerous and against the law. This medicine may cause accidental overdose and death if it taken by other adults, children, or pets. Mix any unused medicine with a substance like cat litter or coffee grounds. Then throw the medicine away in a sealed container like a sealed bag or a coffee can with a lid. Do not use the medicine after the expiration date. Store at room  temperature between 15 and 30 degrees C (59 and 86 degrees F). NOTE: This sheet is a summary. It may not cover all possible information. If you have questions about this medicine, talk to your doctor, pharmacist, or health care provider.  2018 Elsevier/Gold Standard (2015-12-25 10:26:12)  

## 2018-05-15 DIAGNOSIS — Z9889 Other specified postprocedural states: Secondary | ICD-10-CM | POA: Diagnosis not present

## 2018-05-18 DIAGNOSIS — R05 Cough: Secondary | ICD-10-CM | POA: Diagnosis not present

## 2018-05-18 DIAGNOSIS — J019 Acute sinusitis, unspecified: Secondary | ICD-10-CM | POA: Diagnosis not present

## 2018-05-18 DIAGNOSIS — K219 Gastro-esophageal reflux disease without esophagitis: Secondary | ICD-10-CM | POA: Diagnosis not present

## 2018-05-22 ENCOUNTER — Other Ambulatory Visit: Payer: Self-pay | Admitting: Physician Assistant

## 2018-05-22 DIAGNOSIS — N951 Menopausal and female climacteric states: Secondary | ICD-10-CM

## 2018-05-25 ENCOUNTER — Ambulatory Visit (INDEPENDENT_AMBULATORY_CARE_PROVIDER_SITE_OTHER): Payer: PPO | Admitting: Physician Assistant

## 2018-05-25 ENCOUNTER — Encounter: Payer: Self-pay | Admitting: Physician Assistant

## 2018-05-25 VITALS — BP 120/70 | HR 88 | Temp 98.1°F | Resp 16 | Wt 181.4 lb

## 2018-05-25 DIAGNOSIS — F5101 Primary insomnia: Secondary | ICD-10-CM | POA: Diagnosis not present

## 2018-05-25 DIAGNOSIS — G8929 Other chronic pain: Secondary | ICD-10-CM | POA: Diagnosis not present

## 2018-05-25 DIAGNOSIS — M5442 Lumbago with sciatica, left side: Secondary | ICD-10-CM | POA: Diagnosis not present

## 2018-05-25 MED ORDER — ZOLPIDEM TARTRATE 10 MG PO TABS: 5.0000 mg | ORAL_TABLET | Freq: Every evening | ORAL | 5 refills | Status: DC | PRN

## 2018-05-25 NOTE — Progress Notes (Signed)
Patient: Teresa Harris Female    DOB: 28-Feb-1941   77 y.o.   MRN: [id number] Visit Date: 05/25/2018  Today's Provider: [name], PA-C   No chief complaint on file.  Subjective:    HPI  Neuropahy, Follow up:  The patient was last seen for Neuropathy 2 weeks ago. Changes made since that visit include try Lyrica to take at bedtime to see if this will help the RLS and neuropathic pain she has in her lower extremities at bedtime. These may also help her sleep due to drowsiness side effect. She is not to take other sleep aids with this.  Referral placed back to Dr. Sharlet Salina. Patient has not been seen by him in one year but is wanting to re-establish care with him. She reports good compliance with treatment. She is not having side effects. Per patient the Lyrica is helping with the RLS and pain. Reports that it help with her sleep the first day only. ------------------------------------------------------------------------      Allergies  Allergen Reactions  . [name] [Moxifloxacin Hcl In Nacl] Other (See Comments)    Headache  . Bimatoprost Other (See Comments)    Made my eyes red, and hurt  . Brimonidine Other (See Comments)    Made my eyes red, and hurt  . Keflex [Cephalexin] Other (See Comments)    headache  . Mobic [Meloxicam] Other (See Comments)    headache  . Propofol Other (See Comments)    Had difficulty waking up  . Travoprost Other (See Comments)    Unknown  . Vicodin [Hydrocodone-Acetaminophen] Nausea And Vomiting and Other (See Comments)    Sick on stomach     Current Outpatient Medications:  .  acetaminophen (TYLENOL) 500 MG tablet, Take 500-1,000 mg by mouth every 6 (six) hours as needed for moderate pain or headache., Disp: , Rfl:  .  amoxicillin-clavulanate (AUGMENTIN) 875-125 MG tablet, , Disp: , Rfl:  .  Calcium Carbonate-Vitamin D (CALCIUM-VITAMIN D3 PO), Take 1 tablet by mouth 2 (two) times daily., Disp: , Rfl:  .  celecoxib  (CELEBREX) 200 MG capsule, Take 1 capsule (200 mg total) by mouth 2 (two) times daily. (Patient taking differently: Take 200 mg by mouth daily. ), Disp: 60 capsule, Rfl: 5 .  cholecalciferol (VITAMIN D) 400 units TABS tablet, Take 400 Units by mouth daily., Disp: , Rfl:  .  cycloSPORINE (RESTASIS) 0.05 % ophthalmic emulsion, Place 1 drop into both eyes 2 (two) times daily. , Disp: , Rfl:  .  [place] 125 MCG tablet, TAKE 1 TABLET BY MOUTH ONCE EVERY MORNING (Patient taking differently: TAKE 125 MCG BY MOUTH ONCE EVERY MORNING), Disp: 30 tablet, Rfl: 6 .  dorzolamide-timolol (COSOPT) 22.3-6.8 MG/ML ophthalmic solution, Place 1 drop into both eyes 2 (two) times daily. , Disp: , Rfl:  .  ELIQUIS 5 MG TABS tablet, TAKE 1 TABLET BY MOUTH TWICE DAILY (Patient taking differently: TAKE 5 MG BY MOUTH TWICE DAILY), Disp: 60 tablet, Rfl: 6 .  Magnesium 400 MG CAPS, Take 400 mg by mouth 2 (two) times daily., Disp: , Rfl:  .  metoprolol succinate (TOPROL-XL) 25 MG 24 hr tablet, Take 0.5 tablets (12.5 mg total) by mouth daily., Disp: 90 tablet, Rfl: 1 .  Multiple Vitamin (MULTIVITAMIN) tablet, Take 1 tablet by mouth daily., Disp: , Rfl:  .  omeprazole (PRILOSEC) 40 MG capsule, Take 40 mg by mouth every morning. , Disp: , Rfl:  .  pregabalin (LYRICA) 50 MG capsule,  Take 1 capsule (50 mg total) by mouth at bedtime. May increase to 2 tabs PO q hs if needed after 1-2 weeks, Disp: 60 capsule, Rfl: 0 .  PREMARIN 0.625 MG tablet, TAKE 1 TABLET BY MOUTH ONCE DAILY, Disp: 90 tablet, Rfl: 3  Review of Systems  Constitutional: Negative.   Respiratory: Negative.   Cardiovascular: Negative.   Musculoskeletal: Positive for back pain.  Neurological: Positive for numbness.  Psychiatric/Behavioral: Positive for sleep disturbance.    Social History   Tobacco Use  . Smoking status: Former Smoker    Packs/day: 1.00    Years: 30.00    Pack years: 30.00    Types: Cigarettes    Last attempt to quit: 02/09/1996    Years since  quitting: 22.3  . Smokeless tobacco: Never Used  Substance Use Topics  . Alcohol use: No   Objective:   BP 120/70 (BP Location: Right Arm, Patient Position: Sitting, Cuff Size: Normal)   Pulse 88   Temp 98.1 F (36.7 C) (Oral)   Resp 16   Wt 181 lb 6.4 oz (82.3 kg)   BMI 27.58 kg/m    Physical Exam  Constitutional: She appears well-developed and well-nourished. No distress.  Neck: Normal range of motion. Neck supple.  Cardiovascular: Normal rate and normal heart sounds. An irregularly irregular rhythm present. Exam reveals no gallop and no friction rub.  No murmur heard. Pulmonary/Chest: Effort normal and breath sounds normal. No respiratory distress. She has no wheezes. She has no rales.  Skin: She is not diaphoretic.  Psychiatric: She has a normal mood and affect. Her behavior is normal. Judgment and thought content normal.  Vitals reviewed.      Assessment & Plan:     1. Primary insomnia Adding back Ambien as below for prn use. Patient still not sleeping well.  - zolpidem (AMBIEN) 10 MG tablet; Take 0.5-1 tablets (5-10 mg total) by mouth at bedtime as needed for sleep.  Dispense: 30 tablet; Refill: 5  2. Chronic left-sided low back pain with left-sided sciatica Improved with Lyrica. Will increase to Lyrica 100mg  (2- 50mg  tabs) q hs. Drowsiness precautions discussed with taking with Ambien. She voices understanding. She is to call if this dose works better. If not may consider changing therapy to gabapentin or requip.       [name], PA-C  [facility]

## 2018-06-03 ENCOUNTER — Other Ambulatory Visit: Payer: Self-pay | Admitting: Physician Assistant

## 2018-06-03 DIAGNOSIS — G629 Polyneuropathy, unspecified: Secondary | ICD-10-CM

## 2018-06-06 DIAGNOSIS — R05 Cough: Secondary | ICD-10-CM | POA: Diagnosis not present

## 2018-06-06 DIAGNOSIS — J329 Chronic sinusitis, unspecified: Secondary | ICD-10-CM | POA: Diagnosis not present

## 2018-06-07 DIAGNOSIS — M79641 Pain in right hand: Secondary | ICD-10-CM | POA: Diagnosis not present

## 2018-06-07 DIAGNOSIS — Z9889 Other specified postprocedural states: Secondary | ICD-10-CM | POA: Diagnosis not present

## 2018-06-12 ENCOUNTER — Other Ambulatory Visit: Payer: Self-pay | Admitting: Cardiovascular Disease

## 2018-06-13 DIAGNOSIS — Z9889 Other specified postprocedural states: Secondary | ICD-10-CM | POA: Diagnosis not present

## 2018-06-13 DIAGNOSIS — M79641 Pain in right hand: Secondary | ICD-10-CM | POA: Diagnosis not present

## 2018-06-14 ENCOUNTER — Telehealth: Payer: Self-pay | Admitting: Physician Assistant

## 2018-06-14 NOTE — Telephone Encounter (Signed)
Pt called saying she does not want to increase the lyrica dosage.  She said it made her feel unsteady  Pt's call back [phone]  [facility]

## 2018-06-15 NOTE — Telephone Encounter (Signed)
She does not have to increase. Is current dose helping her RLS symptoms?

## 2018-06-15 NOTE — Telephone Encounter (Signed)
LMTCB  Thanks,  -Joseline 

## 2018-06-15 NOTE — Telephone Encounter (Signed)
Please Review.  Thanks,  -Joseline 

## 2018-06-15 NOTE — Telephone Encounter (Signed)
Patient is returning a call to [place].  Please call her.

## 2018-06-15 NOTE — Telephone Encounter (Signed)
Patient reports that currently she is doing well.  Thanks,  -Teresa Harris

## 2018-06-27 DIAGNOSIS — M79641 Pain in right hand: Secondary | ICD-10-CM | POA: Diagnosis not present

## 2018-06-27 DIAGNOSIS — Z9889 Other specified postprocedural states: Secondary | ICD-10-CM | POA: Diagnosis not present

## 2018-06-29 DIAGNOSIS — Z888 Allergy status to other drugs, medicaments and biological substances status: Secondary | ICD-10-CM | POA: Diagnosis not present

## 2018-06-29 DIAGNOSIS — M1990 Unspecified osteoarthritis, unspecified site: Secondary | ICD-10-CM | POA: Diagnosis not present

## 2018-06-29 DIAGNOSIS — E669 Obesity, unspecified: Secondary | ICD-10-CM | POA: Diagnosis not present

## 2018-06-29 DIAGNOSIS — H401123 Primary open-angle glaucoma, left eye, severe stage: Secondary | ICD-10-CM | POA: Diagnosis not present

## 2018-06-29 DIAGNOSIS — Z79899 Other long term (current) drug therapy: Secondary | ICD-10-CM | POA: Diagnosis not present

## 2018-06-29 DIAGNOSIS — Z96659 Presence of unspecified artificial knee joint: Secondary | ICD-10-CM | POA: Diagnosis not present

## 2018-06-29 DIAGNOSIS — Z7901 Long term (current) use of anticoagulants: Secondary | ICD-10-CM | POA: Diagnosis not present

## 2018-07-07 DIAGNOSIS — M79641 Pain in right hand: Secondary | ICD-10-CM | POA: Diagnosis not present

## 2018-07-07 DIAGNOSIS — Z9889 Other specified postprocedural states: Secondary | ICD-10-CM | POA: Diagnosis not present

## 2018-07-10 ENCOUNTER — Other Ambulatory Visit: Payer: Self-pay | Admitting: Cardiovascular Disease

## 2018-07-11 NOTE — Telephone Encounter (Signed)
Please review for refill, Thanks !  

## 2018-07-21 DIAGNOSIS — M533 Sacrococcygeal disorders, not elsewhere classified: Secondary | ICD-10-CM | POA: Diagnosis not present

## 2018-07-21 DIAGNOSIS — M5136 Other intervertebral disc degeneration, lumbar region: Secondary | ICD-10-CM | POA: Diagnosis not present

## 2018-07-21 DIAGNOSIS — M5416 Radiculopathy, lumbar region: Secondary | ICD-10-CM | POA: Diagnosis not present

## 2018-07-24 ENCOUNTER — Other Ambulatory Visit: Payer: Self-pay | Admitting: Physician Assistant

## 2018-07-24 DIAGNOSIS — Z1231 Encounter for screening mammogram for malignant neoplasm of breast: Secondary | ICD-10-CM

## 2018-08-14 ENCOUNTER — Ambulatory Visit
Admission: RE | Admit: 2018-08-14 | Discharge: 2018-08-14 | Disposition: A | Discharge status: Home / Self Care | Payer: PPO | Source: Ambulatory Visit | Attending: Physician Assistant | Admitting: Physician Assistant

## 2018-08-14 DIAGNOSIS — Z1231 Encounter for screening mammogram for malignant neoplasm of breast: Secondary | ICD-10-CM | POA: Insufficient documentation

## 2018-08-15 ENCOUNTER — Telehealth: Payer: Self-pay

## 2018-08-15 NOTE — Telephone Encounter (Signed)
-----   Message from [name], PA-C sent at 08/15/2018 10:14 AM EDT ----- Normal mammogram. Repeat screening in one year.

## 2018-08-15 NOTE — Telephone Encounter (Signed)
LMTCB  Thanks,  -Joseline 

## 2018-08-16 NOTE — Telephone Encounter (Signed)
Pt returned call

## 2018-08-17 ENCOUNTER — Ambulatory Visit (INDEPENDENT_AMBULATORY_CARE_PROVIDER_SITE_OTHER): Payer: PPO | Admitting: Physician Assistant

## 2018-08-17 ENCOUNTER — Encounter: Payer: Self-pay | Admitting: Physician Assistant

## 2018-08-17 VITALS — BP 130/70 | HR 72 | Temp 97.7°F | Resp 20 | Wt 183.0 lb

## 2018-08-17 DIAGNOSIS — G629 Polyneuropathy, unspecified: Secondary | ICD-10-CM

## 2018-08-17 DIAGNOSIS — E1142 Type 2 diabetes mellitus with diabetic polyneuropathy: Secondary | ICD-10-CM | POA: Insufficient documentation

## 2018-08-17 DIAGNOSIS — R21 Rash and other nonspecific skin eruption: Secondary | ICD-10-CM

## 2018-08-17 DIAGNOSIS — Z23 Encounter for immunization: Secondary | ICD-10-CM | POA: Diagnosis not present

## 2018-08-17 DIAGNOSIS — M5416 Radiculopathy, lumbar region: Secondary | ICD-10-CM | POA: Diagnosis not present

## 2018-08-17 DIAGNOSIS — G8929 Other chronic pain: Secondary | ICD-10-CM

## 2018-08-17 DIAGNOSIS — M5442 Lumbago with sciatica, left side: Secondary | ICD-10-CM | POA: Diagnosis not present

## 2018-08-17 MED ORDER — PREGABALIN 100 MG PO CAPS: 100.0000 mg | ORAL_CAPSULE | Freq: Every day | ORAL | 1 refills | Status: DC

## 2018-08-17 MED ORDER — MOMETASONE FUROATE 0.1 % EX CREA: 1.0000 "application " | TOPICAL_CREAM | Freq: Every day | CUTANEOUS | 0 refills | Status: DC

## 2018-08-17 NOTE — Progress Notes (Signed)
Patient: Teresa Harris Female    DOB: Apr 09, 1941   77 y.o.   MRN: [id number] Visit Date: 08/17/2018  Today's Provider: [name], PA-C   Chief Complaint  Patient presents with  . Insect Bite   Subjective:    HPI Rash: Patient complains of rash involving the left lower leg. Rash started 1 week ago. Appearance of rash at onset: Color of lesion(s): dark and purple. Rash has changed over time Initial distribution: left lower leg.  Discomfort associated with rash: is painful and is pruritic.  Associated symptoms: none. Denies: fever and headache. Patient has not had previous evaluation of rash. Patient has not had previous treatment. Patient has not had contacts with similar rash. Patient has not identified precipitant. Patient has not had new exposures (soaps, lotions, laundry detergents, foods, medications, plants, insects or animals.) Patient has been using hydrocortisone and clobetasol cream. Patient is now concerned about shingles.  Reports was in bed Saturday almost 2 weeks ago now and felt something like a bite on the back of her leg. She jumped up out of bed and looked for a spider but could not find anything. The leg started aching in the spot of the bite almost immediately. Then the next day it was achy and very itchy. The rash spread around the area where she thought there was a bite. Since then it worsened over the following week but has been improving this week. She denies any vesicular lesions and they have not spread from where there are currently.     Allergies  Allergen Reactions  . [name] [Moxifloxacin Hcl In Nacl] Other (See Comments)    Headache  . Bimatoprost Other (See Comments)    Made my eyes red, and hurt  . Brimonidine Other (See Comments)    Made my eyes red, and hurt  . Keflex [Cephalexin] Other (See Comments)    headache  . Mobic [Meloxicam] Other (See Comments)    headache  . Propofol Other (See Comments)    Had difficulty waking up  .  Travoprost Other (See Comments)    Unknown  . Vicodin [Hydrocodone-Acetaminophen] Nausea And Vomiting and Other (See Comments)    Sick on stomach     Current Outpatient Medications:  .  acetaminophen (TYLENOL) 500 MG tablet, Take 500-1,000 mg by mouth every 6 (six) hours as needed for moderate pain or headache., Disp: , Rfl:  .  Calcium Carbonate-Vitamin D (CALCIUM-VITAMIN D3 PO), Take 1 tablet by mouth 2 (two) times daily., Disp: , Rfl:  .  celecoxib (CELEBREX) 200 MG capsule, Take 1 capsule (200 mg total) by mouth 2 (two) times daily. (Patient taking differently: Take 200 mg by mouth daily. ), Disp: 60 capsule, Rfl: 5 .  cholecalciferol (VITAMIN D) 400 units TABS tablet, Take 400 Units by mouth daily., Disp: , Rfl:  .  cycloSPORINE (RESTASIS) 0.05 % ophthalmic emulsion, Place 1 drop into both eyes 2 (two) times daily. , Disp: , Rfl:  .  digoxin (LANOXIN) 0.125 MG tablet, TAKE 1 TABLET BY MOUTH ONCE EVERY MORNING, Disp: 30 tablet, Rfl: 3 .  dorzolamide-timolol (COSOPT) 22.3-6.8 MG/ML ophthalmic solution, Place 1 drop into both eyes 2 (two) times daily. , Disp: , Rfl:  .  ELIQUIS 5 MG TABS tablet, TAKE 1 TABLET BY MOUTH TWICE DAILY, Disp: 60 tablet, Rfl: 6 .  latanoprost (XALATAN) 0.005 % ophthalmic solution, Apply 1 drop to eye daily., Disp: , Rfl:  .  LYRICA 50 MG capsule, TAKE  1 CAPSULE BY MOUTH AT BEDTIME. MAY INCREASE TO 2 BY MOUTH AT BEDTIME IFNEEDED AFTER 1-2 WKS, Disp: 60 capsule, Rfl: 5 .  Magnesium 400 MG CAPS, Take 400 mg by mouth 2 (two) times daily., Disp: , Rfl:  .  metoprolol succinate (TOPROL-XL) 25 MG 24 hr tablet, Take 0.5 tablets (12.5 mg total) by mouth daily., Disp: 90 tablet, Rfl: 1 .  Multiple Vitamin (MULTIVITAMIN) tablet, Take 1 tablet by mouth daily., Disp: , Rfl:  .  omeprazole (PRILOSEC) 40 MG capsule, Take 40 mg by mouth every morning. , Disp: , Rfl:  .  prednisoLONE acetate (PRED FORTE) 1 % ophthalmic suspension, Administer 1 drop into the left eye Four (4) times a  day., Disp: , Rfl:  .  PREMARIN 0.625 MG tablet, TAKE 1 TABLET BY MOUTH ONCE DAILY, Disp: 90 tablet, Rfl: 3 .  zolpidem (AMBIEN) 10 MG tablet, Take 0.5-1 tablets (5-10 mg total) by mouth at bedtime as needed for sleep., Disp: 30 tablet, Rfl: 5  Review of Systems  Constitutional: Negative.   Respiratory: Negative.   Cardiovascular: Negative.   Musculoskeletal: Negative.   Skin: Positive for color change and rash.    Social History   Tobacco Use  . Smoking status: Former Smoker    Packs/day: 1.00    Years: 30.00    Pack years: 30.00    Types: Cigarettes    Last attempt to quit: 02/09/1996    Years since quitting: 22.5  . Smokeless tobacco: Never Used  Substance Use Topics  . Alcohol use: No   Objective:   BP 130/70 (BP Location: Left Arm, Patient Position: Sitting, Cuff Size: Normal)   Pulse 72   Temp 97.7 F (36.5 C) (Oral)   Resp 20   Wt 183 lb (83 kg)   SpO2 98%   BMI 27.83 kg/m  Vitals:   08/17/18 0842  BP: 130/70  Pulse: 72  Resp: 20  Temp: 97.7 F (36.5 C)  TempSrc: Oral  SpO2: 98%  Weight: 183 lb (83 kg)     Physical Exam  Constitutional: She appears well-developed and well-nourished. No distress.  Neck: Normal range of motion. Neck supple. No JVD present. No tracheal deviation present. No thyromegaly present.  Cardiovascular: Normal rate, regular rhythm and normal heart sounds. Exam reveals no gallop and no friction rub.  No murmur heard. Pulmonary/Chest: Effort normal and breath sounds normal. No respiratory distress. She has no wheezes. She has no rales.  Lymphadenopathy:    She has no cervical adenopathy.  Skin: Skin is warm and dry. Purpura and rash noted. She is not diaphoretic.  See image below  Vitals reviewed.         Assessment & Plan:     1. Rash and nonspecific skin eruption Unsure of cause, possible bite. Rash inconsistent with shingles. Offered oral prednisone but patient declines stating she cannot sleep with prednisone. Will use  mometasone cream as below instead. She is to call if symptoms worsen. If no changes by Monday she is to call and we may try valtrex to r/o shingles. - mometasone (ELOCON) 0.1 % cream; Apply 1 application topically daily.  Dispense: 45 g; Refill: 0  2. Need for influenza vaccination Flu vaccine given today without complication. Patient sat upright for 15 minutes to check for adverse reaction before being released. - Flu vaccine HIGH DOSE PF (Fluzone High dose)  3. Chronic left-sided low back pain with left-sided sciatica Stable. Diagnosis pulled for medication refill. Continue current medical treatment plan. -  pregabalin (LYRICA) 100 MG capsule; Take 1 capsule (100 mg total) by mouth daily.  Dispense: 90 capsule; Refill: 1  4. Left lumbar radiculitis Stable. Diagnosis pulled for medication refill. Continue current medical treatment plan. - pregabalin (LYRICA) 100 MG capsule; Take 1 capsule (100 mg total) by mouth daily.  Dispense: 90 capsule; Refill: 1  5. Neuropathy Stable. Diagnosis pulled for medication refill. Continue current medical treatment plan. - pregabalin (LYRICA) 100 MG capsule; Take 1 capsule (100 mg total) by mouth daily.  Dispense: 90 capsule; Refill: [place], PA-C  [facility]

## 2018-08-17 NOTE — Telephone Encounter (Signed)
Patient advised as below.  

## 2018-08-22 DIAGNOSIS — M5416 Radiculopathy, lumbar region: Secondary | ICD-10-CM | POA: Diagnosis not present

## 2018-08-22 DIAGNOSIS — M5136 Other intervertebral disc degeneration, lumbar region: Secondary | ICD-10-CM | POA: Diagnosis not present

## 2018-08-28 DIAGNOSIS — Z1283 Encounter for screening for malignant neoplasm of skin: Secondary | ICD-10-CM | POA: Diagnosis not present

## 2018-08-28 DIAGNOSIS — L821 Other seborrheic keratosis: Secondary | ICD-10-CM | POA: Diagnosis not present

## 2018-08-28 DIAGNOSIS — Z872 Personal history of diseases of the skin and subcutaneous tissue: Secondary | ICD-10-CM | POA: Diagnosis not present

## 2018-08-28 DIAGNOSIS — Z86018 Personal history of other benign neoplasm: Secondary | ICD-10-CM | POA: Diagnosis not present

## 2018-08-28 DIAGNOSIS — L718 Other rosacea: Secondary | ICD-10-CM | POA: Diagnosis not present

## 2018-08-28 DIAGNOSIS — L578 Other skin changes due to chronic exposure to nonionizing radiation: Secondary | ICD-10-CM | POA: Diagnosis not present

## 2018-09-07 ENCOUNTER — Telehealth: Payer: Self-pay | Admitting: Physician Assistant

## 2018-09-07 DIAGNOSIS — M1990 Unspecified osteoarthritis, unspecified site: Secondary | ICD-10-CM

## 2018-09-07 MED ORDER — CELECOXIB 200 MG PO CAPS: 200.0000 mg | ORAL_CAPSULE | Freq: Two times a day (BID) | ORAL | 5 refills | Status: DC

## 2018-09-07 NOTE — Telephone Encounter (Signed)
It has not been filled by [place] since January. We had filled it for 60 with 5 refills. Her pharmacy most likely gave her a few to hold over until she contacted [place] for refill. I have sent in 60 w/ 7 refills as is standard

## 2018-09-07 NOTE — Telephone Encounter (Signed)
Pt wanting to know why she only got 30 pills and 1 refill of her celecoxib (CELEBREX) 200 MG capsules.  Usually has 7 refills for 60 pills.   Please call pt back to let her know on this.  Thanks, [employer]

## 2018-09-07 NOTE — Telephone Encounter (Signed)
LM explaining as directed below.

## 2018-09-07 NOTE — Telephone Encounter (Signed)
Please Review

## 2018-09-22 DIAGNOSIS — M5416 Radiculopathy, lumbar region: Secondary | ICD-10-CM | POA: Diagnosis not present

## 2018-09-22 DIAGNOSIS — M5136 Other intervertebral disc degeneration, lumbar region: Secondary | ICD-10-CM | POA: Diagnosis not present

## 2018-09-22 DIAGNOSIS — M533 Sacrococcygeal disorders, not elsewhere classified: Secondary | ICD-10-CM | POA: Diagnosis not present

## 2018-10-06 DIAGNOSIS — H401131 Primary open-angle glaucoma, bilateral, mild stage: Secondary | ICD-10-CM | POA: Diagnosis not present

## 2018-10-06 DIAGNOSIS — H2513 Age-related nuclear cataract, bilateral: Secondary | ICD-10-CM | POA: Diagnosis not present

## 2018-10-20 ENCOUNTER — Other Ambulatory Visit: Payer: Self-pay | Admitting: Physician Assistant

## 2018-10-20 DIAGNOSIS — I482 Chronic atrial fibrillation, unspecified: Secondary | ICD-10-CM

## 2018-10-26 DIAGNOSIS — Z87891 Personal history of nicotine dependence: Secondary | ICD-10-CM | POA: Diagnosis not present

## 2018-10-26 DIAGNOSIS — I4891 Unspecified atrial fibrillation: Secondary | ICD-10-CM | POA: Diagnosis not present

## 2018-10-26 DIAGNOSIS — H2512 Age-related nuclear cataract, left eye: Secondary | ICD-10-CM | POA: Diagnosis not present

## 2018-10-30 DIAGNOSIS — L718 Other rosacea: Secondary | ICD-10-CM | POA: Diagnosis not present

## 2018-11-09 DIAGNOSIS — Z87891 Personal history of nicotine dependence: Secondary | ICD-10-CM | POA: Diagnosis not present

## 2018-11-09 DIAGNOSIS — I4891 Unspecified atrial fibrillation: Secondary | ICD-10-CM | POA: Diagnosis not present

## 2018-11-09 DIAGNOSIS — H2511 Age-related nuclear cataract, right eye: Secondary | ICD-10-CM | POA: Diagnosis not present

## 2018-11-10 ENCOUNTER — Other Ambulatory Visit: Payer: Self-pay | Admitting: Cardiovascular Disease

## 2018-11-27 ENCOUNTER — Other Ambulatory Visit: Payer: Self-pay | Admitting: Physician Assistant

## 2018-11-27 DIAGNOSIS — G629 Polyneuropathy, unspecified: Secondary | ICD-10-CM

## 2018-11-27 DIAGNOSIS — M5416 Radiculopathy, lumbar region: Secondary | ICD-10-CM

## 2018-11-27 DIAGNOSIS — M5442 Lumbago with sciatica, left side: Principal | ICD-10-CM

## 2018-11-27 DIAGNOSIS — G8929 Other chronic pain: Secondary | ICD-10-CM

## 2018-12-04 ENCOUNTER — Telehealth: Payer: Self-pay | Admitting: Cardiovascular Disease

## 2018-12-04 NOTE — Telephone Encounter (Signed)
Patient c/o Palpitations:  High priority if patient c/o lightheadedness, shortness of breath, or chest pain  1) How long have you had palpitations/irregular HR/ Afib? Are you having the symptoms now? Just happened yesterday   2) Are you currently experiencing lightheadedness, SOB or CP? Sob   3) Do you have a history of afib (atrial fibrillation) or irregular heart rhythm? yes  4) Have you checked your BP or HR? (document readings if available): no   5) Are you experiencing any other symptoms? Patient was climbing stairs at [employer] and thinks she may have had a panic attack but is not sure if she needs to be seen

## 2018-12-05 NOTE — Telephone Encounter (Signed)
Patient returning call.

## 2018-12-05 NOTE — Telephone Encounter (Signed)
Left a message for the patient to call back.  

## 2018-12-05 NOTE — Telephone Encounter (Signed)
Patient calling States that she will be going out to an appointment Best to contact cell number at [phone]

## 2018-12-05 NOTE — Telephone Encounter (Signed)
Returned the call to the patient. She stated that on Sunday she was at the [facility] when she experienced palpitations and shortness of breath while she was walking.  She has not experienced that occurrence again but stated that she is still having shortness of breath on exertion. Se denies chest pain or discomfort.   She requested an appointment with Dr. Fletcher Anon and has been made aware that the soonest appointment is on 1/7. The patient stated that this was fine and she felt comfortable waiting.   She has been made aware that if she becomes symptomatic or feels the palpitations again that she needs to go to the ED or an urgent care for further assessment. She has verbalized her understanding.

## 2018-12-07 ENCOUNTER — Encounter: Payer: Self-pay | Admitting: Cardiovascular Disease

## 2018-12-07 ENCOUNTER — Other Ambulatory Visit
Admission: RE | Admit: 2018-12-07 | Discharge: 2018-12-07 | Disposition: A | Discharge status: Home / Self Care | Payer: PPO | Source: Ambulatory Visit | Attending: Cardiovascular Disease | Admitting: Cardiovascular Disease

## 2018-12-07 ENCOUNTER — Ambulatory Visit: Payer: PPO | Admitting: Cardiovascular Disease

## 2018-12-07 VITALS — BP 158/80 | HR 64 | Ht 68.0 in | Wt 194.8 lb

## 2018-12-07 DIAGNOSIS — I509 Heart failure, unspecified: Secondary | ICD-10-CM

## 2018-12-07 DIAGNOSIS — I482 Chronic atrial fibrillation, unspecified: Secondary | ICD-10-CM | POA: Diagnosis not present

## 2018-12-07 LAB — BASIC METABOLIC PANEL
Anion gap: 5 (ref 5–15)
BUN: 15 mg/dL (ref 8–23)
CO2: 27 mmol/L (ref 22–32)
Calcium: 9 mg/dL (ref 8.9–10.3)
Chloride: 107 mmol/L (ref 98–111)
Creatinine, Ser: 0.89 mg/dL (ref 0.44–1.00)
GFR calc Af Amer: >60 mL/min (ref >60)
GFR calc non Af Amer: >60 mL/min (ref >60)
Glucose, Bld: 98 mg/dL (ref 70–99)
Potassium: 4.4 mmol/L (ref 3.5–5.1)
Sodium: 139 mmol/L (ref 135–145)

## 2018-12-07 LAB — CBC
HCT: 43 % (ref 36.0–46.0)
Hemoglobin: 14.2 g/dL (ref 12.0–15.0)
MCH: 32.4 pg (ref 26.0–34.0)
MCHC: 33 g/dL (ref 30.0–36.0)
MCV: 98.2 fL (ref 80.0–100.0)
Platelets: 161 10*3/uL (ref 150–400)
RBC: 4.38 MIL/uL (ref 3.87–5.11)
RDW: 13.1 % (ref 11.5–15.5)
WBC: 6.8 10*3/uL (ref 4.0–10.5)
nRBC: 0 % (ref 0.0–0.2)

## 2018-12-07 LAB — BRAIN NATRIURETIC PEPTIDE: B Natriuretic Peptide: 285 pg/mL — ABNORMAL HIGH (ref 0.0–100.0)

## 2018-12-07 LAB — DIGOXIN LEVEL: Digoxin Level: 0.5 ng/mL — ABNORMAL LOW (ref 0.8–2.0)

## 2018-12-07 LAB — TSH: TSH: 1.901 u[IU]/mL (ref 0.350–4.500)

## 2018-12-07 MED ORDER — METOPROLOL SUCCINATE ER 25 MG PO TB24: 12.5000 mg | ORAL_TABLET | Freq: Every day | ORAL | 1 refills | Status: DC

## 2018-12-07 NOTE — Progress Notes (Signed)
Cardiology Office Note   Date:  12/07/2018   ID:  Teresa Harris, DOB May 07, 1941, MRN [id number]  PCP:  [name], PA-C  Cardiologist:   Kathlyn Sacramento, MD   Chief Complaint  Patient presents with  . other    Palpitations and sob. Meds reviewed verbally with pt.      History of Present Illness: Teresa Harris is a 78 y.o. female who presents for a follow-up regarding chronic atrial fibrillation.  She has borderline diabetes and hyperlipidemia not requiring medications. She smoked in the past more than 30 years ago. There is no family history of coronary artery disease or arrhythmia.  Nuclear stress test in November 2015 which showed no evidence of ischemia with normal ejection fraction. Echocardiogram showed normal EF with mild to moderate mitral regurgitation and mildly dilated left atrium.  She is only able to tolerate small dose metoprolol due to extreme fatigue.  Digoxin is being used for rate control.   She was diagnosed with bilateral carpal tunnel syndrome over the last year and underwent surgery.  She also has been dealing with eye problems related to glaucoma that also required multiple surgeries.  Over the last month, she has experienced more shortness of breath with leg edema and weight gain.  She was visiting [place] in [place] recently and was trying to go upstairs.  She had an episode of palpitations with significant dyspnea and she could not continue.  She was wheezing and she had to rest.  No chest pain.  She gained 11 pounds over the last 3 months with worsening leg edema and some orthopnea.   Past Medical History:  Diagnosis Date  . Arthritis   . Atrial fibrillation ([place]) 10/01/14  . Complication of anesthesia    trouble waking up with Propofol  . Depression    h/o  . Dysrhythmia    a fib  . Family history of adverse reaction to anesthesia    daughter PONV  . Glaucoma    left eye  . Headache    H/O MIGRAINES  . Hemorrhoids   .  Hiatal hernia   . History of kidney stones   . History of migraine headaches   . Hx MRSA infection   . Hypercholesteremia   . Kidney calculi   . Osteopenia   . PONV (postoperative nausea and vomiting)     Past Surgical History:  Procedure Laterality Date  . ABDOMINAL HYSTERECTOMY    . AUGMENTATION MAMMAPLASTY Bilateral 2004  . CARPAL TUNNEL RELEASE Right 02/16/2016   Procedure: CARPAL TUNNEL RELEASE;  Surgeon: Dereck Leep, MD;  Location: [facility] ORS;  Service: Orthopedics;  Laterality: Right;  . CARPAL TUNNEL RELEASE Left 04/12/2016   Procedure: CARPAL TUNNEL RELEASE;  Surgeon: Dereck Leep, MD;  Location: [facility] ORS;  Service: Orthopedics;  Laterality: Left;  . CARPAL TUNNEL RELEASE Right 03/13/2018   Procedure: CARPAL TUNNEL RELEASE;  Surgeon: Deetta Perla, MD;  Location: [facility] ORS;  Service: Neurosurgery;  Laterality: Right;  . COLONOSCOPY  12/29/09   Dr Dionne Milo  . EYE SURGERY Left    shunt placed  . FOOT SURGERY     bilateral   . JOINT REPLACEMENT    . NASAL SEPTUM SURGERY    . REPLACEMENT TOTAL KNEE     bilateral  . SHOULDER SURGERY     right shoulder     Current Outpatient Medications  Medication Sig Dispense Refill  . acetaminophen (TYLENOL) 500 MG tablet Take 500-1,000 mg by  mouth every 6 (six) hours as needed for moderate pain or headache.    . Calcium Carbonate-Vitamin D (CALCIUM-VITAMIN D3 PO) Take 1 tablet by mouth 2 (two) times daily.    . celecoxib (CELEBREX) 200 MG capsule Take 1 capsule (200 mg total) by mouth 2 (two) times daily. (Patient taking differently: Take 200 mg by mouth 2 (two) times daily as needed. ) 60 capsule 5  . cholecalciferol (VITAMIN D) 400 units TABS tablet Take 400 Units by mouth 2 (two) times daily.     . cycloSPORINE (RESTASIS) 0.05 % ophthalmic emulsion Place 1 drop into both eyes 2 (two) times daily.     . digoxin (LANOXIN) 0.125 MG tablet TAKE 1 TABLET BY MOUTH ONCE EVERY MORNING 30 tablet 3  . dorzolamide-timolol (COSOPT) 22.3-6.8 MG/ML  ophthalmic solution Place 1 drop into both eyes 2 (two) times daily.     Marland Kitchen ELIQUIS 5 MG TABS tablet TAKE 1 TABLET BY MOUTH TWICE DAILY 60 tablet 6  . latanoprost (XALATAN) 0.005 % ophthalmic solution Apply 1 drop to eye daily.    Marland Kitchen LYRICA 100 MG capsule TAKE 1 CAPSULE BY MOUTH ONCE DAILY 90 capsule 1  . Magnesium 400 MG CAPS Take 400 mg by mouth 2 (two) times daily.    . metoprolol succinate (TOPROL-XL) 25 MG 24 hr tablet TAKE 1/2 TABLET BY MOUTH ONCE DAILY 90 tablet 1  . Multiple Vitamin (MULTIVITAMIN) tablet Take 1 tablet by mouth daily.    Marland Kitchen omeprazole (PRILOSEC) 40 MG capsule Take 40 mg by mouth every morning.     Marland Kitchen PREMARIN 0.625 MG tablet TAKE 1 TABLET BY MOUTH ONCE DAILY 90 tablet 3  . zolpidem (AMBIEN) 10 MG tablet Take 0.5-1 tablets (5-10 mg total) by mouth at bedtime as needed for sleep. 30 tablet 5   No current facility-administered medications for this visit.     Allergies:   [name] [moxifloxacin hcl in nacl]; Bimatoprost; Brimonidine; Keflex [cephalexin]; Mobic [meloxicam]; Propofol; Travoprost; and Vicodin [hydrocodone-acetaminophen]    Social History:  The patient  reports that she quit smoking about 22 years ago. Her smoking use included cigarettes. She has a 30.00 pack-year smoking history. She has never used smokeless tobacco. She reports that she does not drink alcohol or use drugs.   Family History:  The patient's family history includes Arthritis in her father and mother.    ROS:  Please see the history of present illness.   Otherwise, review of systems are positive for none.   All other systems are reviewed and negative.    PHYSICAL EXAM: VS:  BP (!) 158/80 (BP Location: Left Arm, Patient Position: Sitting, Cuff Size: Normal)   Pulse 64   Ht 5\' 8"  (1.727 m)   Wt 194 lb 12 oz (88.3 kg)   BMI 29.61 kg/m  , BMI Body mass index is 29.61 kg/m. GEN: Well nourished, well developed, in no acute distress  HEENT: normal  Neck: Moderate JVD, carotid bruits, or  masses Cardiac: Irregularly irregular ; no murmurs, rubs, or gallops, mild to moderate bilateral leg edema edema  Respiratory:  clear to auscultation bilaterally, normal work of breathing GI: soft, nontender, nondistended, + BS MS: no deformity or atrophy  Skin: warm and dry, no rash Neuro:  Strength and sensation are intact Psych: euthymic mood, full affect   EKG:  EKG is ordered today. The ekg ordered today demonstrates atrial fibrillation with low voltage.  Inferolateral ST changes suggestive of ischemia.  Recent Labs: 12/30/2017: ALT 20 03/09/2018: BUN  13; Creatinine, Ser 0.50; Hemoglobin 14.2; Platelets 205; Potassium 4.3; Sodium 136    Lipid Panel    Component Value Date/Time   CHOL 170 12/30/2017 1118   TRIG 243 (H) 12/30/2017 1118   HDL 45 12/30/2017 1118   CHOLHDL 4.0 12/21/2016 1017   LDLCALC 76 12/30/2017 1118      Wt Readings from Last 3 Encounters:  12/07/18 194 lb 12 oz (88.3 kg)  08/17/18 183 lb (83 kg)  05/25/18 181 lb 6.4 oz (82.3 kg)       ASSESSMENT AND PLAN:  1. Chronic atrial fibrillation: Ventricular rate is reasonably controlled with small dose digoxin and metoprolol. She is tolerating anticoagulation with no side effects.   She had recent episode of palpitation and tachycardia with exertion which likely was due to atrial fibrillation with RVR.  Ventricular rate today is well controlled.  We might need to consider increasing the dose of Toprol.  I am going to check her routine labs today.  2.  Acute heart failure: No recent EF evaluation and thus I requested an echocardiogram.  The patient is 11 pounds above her weight in September and her exam is suggestive of volume overload.  I am going to request routine labs today including BNP before prescribing any diuretic.  We need to know her baseline renal function. She does have some features worrisome for possible cardiac amyloidosis including bilateral carpal tunnel syndrome in addition to low voltage on  her EKG.  Evaluation for this will be considered after echocardiogram.  Disposition:   FU with me in 1 months  Signed,  Kathlyn Sacramento, MD  12/07/2018 3:58 PM    [place]

## 2018-12-07 NOTE — Patient Instructions (Addendum)
Medication Instructions:  No changes If you need a refill on your cardiac medications before your next appointment, please call your pharmacy.   Lab work: Your provider would like for you to have the following labs today: Bmet, CBC, TSH, Digoxin, BNP   If you have labs (blood work) drawn today and your tests are completely normal, you will receive your results only by: Marland Kitchen MyChart Message (if you have MyChart) OR . A paper copy in the mail If you have any lab test that is abnormal or we need to change your treatment, we will call you to review the results.  Testing/Procedures: Your physician has requested that you have an echocardiogram. Echocardiography is a painless test that uses sound waves to create images of your heart. It provides your doctor with information about the size and shape of your heart and how well your heart's chambers and valves are working. You may receive an ultrasound enhancing agent through an IV if needed to better visualize your heart during the echo.This procedure takes approximately one hour. There are no restrictions for this procedure. This will take place at the [facility] clinic.    Follow-Up: At [facility], you and your health needs are our priority.  As part of our continuing mission to provide you with exceptional heart care, we have created designated Provider Care Teams.  These Care Teams include your primary Cardiologist (physician) and Advanced Practice Providers (APPs -  Physician Assistants and Nurse Practitioners) who all work together to provide you with the care you need, when you need it. You will need a follow up appointment in 1 months with Dr. Fletcher Anon or one of the following Advanced Practice Providers on your designated Care Team:   Murray Hodgkins, NP Christell Faith, PA-C . Marrianne Mood, PA-C

## 2018-12-08 ENCOUNTER — Telehealth: Payer: Self-pay | Admitting: *Deleted

## 2018-12-08 DIAGNOSIS — I10 Essential (primary) hypertension: Secondary | ICD-10-CM

## 2018-12-08 MED ORDER — FUROSEMIDE 20 MG PO TABS: 20.0000 mg | ORAL_TABLET | Freq: Every day | ORAL | 1 refills | Status: DC

## 2018-12-08 NOTE — Telephone Encounter (Signed)
Patient made aware of results and verbalized understanding.  Orders placed and furosemide has been called in for her.

## 2018-12-08 NOTE — Telephone Encounter (Signed)
Left a message for the patient to call back.  

## 2018-12-08 NOTE — Telephone Encounter (Signed)
-----   Message from Wellington Hampshire, MD sent at 12/07/2018  5:58 PM EST ----- Inform patient that labs were normal so far except for mildly elevated BNP.  Recommend adding furosemide 20 mg once daily.  Recheck basic metabolic profile in 1 week.

## 2018-12-12 ENCOUNTER — Ambulatory Visit: Payer: PPO | Admitting: Cardiovascular Disease

## 2018-12-15 ENCOUNTER — Other Ambulatory Visit (INDEPENDENT_AMBULATORY_CARE_PROVIDER_SITE_OTHER): Payer: PPO

## 2018-12-15 DIAGNOSIS — I10 Essential (primary) hypertension: Secondary | ICD-10-CM | POA: Diagnosis not present

## 2018-12-16 LAB — BASIC METABOLIC PANEL
BUN/Creatinine Ratio: 22 (ref 12–28)
BUN: 19 mg/dL (ref 8–27)
CO2: 26 mmol/L (ref 20–29)
Calcium: 9.8 mg/dL (ref 8.7–10.3)
Chloride: 97 mmol/L (ref 96–106)
Creatinine, Ser: 0.87 mg/dL (ref 0.57–1.00)
GFR calc Af Amer: 75 mL/min/{1.73_m2} (ref >59)
GFR calc non Af Amer: 65 mL/min/{1.73_m2} (ref >59)
Glucose: 104 mg/dL — ABNORMAL HIGH (ref 65–99)
Potassium: 4.8 mmol/L (ref 3.5–5.2)
Sodium: 139 mmol/L (ref 134–144)

## 2018-12-22 ENCOUNTER — Telehealth: Payer: Self-pay

## 2018-12-22 NOTE — Telephone Encounter (Signed)
LMTCB and schedule AWV for this year. -MM 

## 2018-12-27 ENCOUNTER — Telehealth: Payer: Self-pay | Admitting: Physician Assistant

## 2018-12-27 NOTE — Telephone Encounter (Signed)
Pt needing to speak with [employer] regarding her AWV.  Please call pt back after 11:30 today. Pt will be home then.  Thanks, [employer]

## 2018-12-27 NOTE — Telephone Encounter (Signed)
Scheduled AWV and CPE for 01/26/19 @ 1:20 and 2:00 PM.  -MM

## 2018-12-27 NOTE — Telephone Encounter (Signed)
Pt returning call to schedule AWV. See previous open encunter.  -MM

## 2018-12-28 ENCOUNTER — Ambulatory Visit (INDEPENDENT_AMBULATORY_CARE_PROVIDER_SITE_OTHER): Payer: PPO

## 2018-12-28 DIAGNOSIS — I509 Heart failure, unspecified: Secondary | ICD-10-CM

## 2019-01-01 ENCOUNTER — Telehealth: Payer: Self-pay | Admitting: *Deleted

## 2019-01-01 NOTE — Telephone Encounter (Signed)
Patient made aware of results and verbalized understanding.  

## 2019-01-01 NOTE — Telephone Encounter (Signed)
Left a message for the patient to call back.  

## 2019-01-01 NOTE — Telephone Encounter (Signed)
-----   Message from Wellington Hampshire, MD sent at 01/01/2019 11:53 AM EST ----- Inform patient that echo was fine.  Normal EF with mild pulmonary hypertension which should improve with small dose Lasix which was started recently.

## 2019-01-16 DIAGNOSIS — M5136 Other intervertebral disc degeneration, lumbar region: Secondary | ICD-10-CM | POA: Diagnosis not present

## 2019-01-16 DIAGNOSIS — M5416 Radiculopathy, lumbar region: Secondary | ICD-10-CM | POA: Diagnosis not present

## 2019-01-22 ENCOUNTER — Other Ambulatory Visit: Payer: Self-pay | Admitting: Cardiovascular Disease

## 2019-01-22 NOTE — Telephone Encounter (Signed)
Refill Request.  

## 2019-01-26 ENCOUNTER — Ambulatory Visit (INDEPENDENT_AMBULATORY_CARE_PROVIDER_SITE_OTHER): Payer: PPO

## 2019-01-26 ENCOUNTER — Encounter: Payer: Self-pay | Admitting: Physician Assistant

## 2019-01-26 ENCOUNTER — Ambulatory Visit (INDEPENDENT_AMBULATORY_CARE_PROVIDER_SITE_OTHER): Payer: PPO | Admitting: Physician Assistant

## 2019-01-26 VITALS — BP 134/88 | HR 76 | Temp 97.7°F | Ht 68.0 in | Wt 193.8 lb

## 2019-01-26 DIAGNOSIS — Z0000 Encounter for general adult medical examination without abnormal findings: Secondary | ICD-10-CM

## 2019-01-26 DIAGNOSIS — I10 Essential (primary) hypertension: Secondary | ICD-10-CM

## 2019-01-26 DIAGNOSIS — I482 Chronic atrial fibrillation, unspecified: Secondary | ICD-10-CM

## 2019-01-26 DIAGNOSIS — E7800 Pure hypercholesterolemia, unspecified: Secondary | ICD-10-CM

## 2019-01-26 DIAGNOSIS — I4811 Longstanding persistent atrial fibrillation: Secondary | ICD-10-CM | POA: Insufficient documentation

## 2019-01-26 DIAGNOSIS — M5416 Radiculopathy, lumbar region: Secondary | ICD-10-CM | POA: Diagnosis not present

## 2019-01-26 DIAGNOSIS — M5442 Lumbago with sciatica, left side: Secondary | ICD-10-CM | POA: Diagnosis not present

## 2019-01-26 DIAGNOSIS — G629 Polyneuropathy, unspecified: Secondary | ICD-10-CM | POA: Diagnosis not present

## 2019-01-26 DIAGNOSIS — G8929 Other chronic pain: Secondary | ICD-10-CM

## 2019-01-26 MED ORDER — PREGABALIN 100 MG PO CAPS: 100.0000 mg | ORAL_CAPSULE | Freq: Every day | ORAL | 0 refills | Status: DC

## 2019-01-26 NOTE — Patient Instructions (Addendum)
Teresa Harris , Thank you for taking time to come for your Medicare Wellness Visit. I appreciate your ongoing commitment to your health goals. Please review the following plan we discussed and let me know if I can assist you in the future.   Screening recommendations/referrals: Colonoscopy: No longer required.  Mammogram: No longer required.  Bone Density: Up to date, due 02/2022 Recommended yearly ophthalmology/optometry visit for glaucoma screening and checkup Recommended yearly dental visit for hygiene and checkup  Vaccinations: Influenza vaccine: Up to date Pneumococcal vaccine: Completed series Tdap vaccine: Up to date, due 11/2020 Shingles vaccine: Pt declines today.     Advanced directives: Please bring a copy of your POA (Power of Attorney) and/or Living Will to your next appointment.   Conditions/risks identified: Recommend to continue current diet plan of cutting down on calories and carbohydrates to help aid in weight loss.   Next appointment: 2:00 PM today with Fenton Malling. Declined scheduling the AWV and CPE for next year.    Preventive Care 20 Years and Older, Female Preventive care refers to lifestyle choices and visits with your health care provider that can promote health and wellness. What does preventive care include?  A yearly physical exam. This is also called an annual well check.  Dental exams once or twice a year.  Routine eye exams. Ask your health care provider how often you should have your eyes checked.  Personal lifestyle choices, including:  Daily care of your teeth and gums.  Regular physical activity.  Eating a healthy diet.  Avoiding tobacco and drug use.  Limiting alcohol use.  Practicing safe sex.  Taking low-dose aspirin every day.  Taking vitamin and mineral supplements as recommended by your health care provider. What happens during an annual well check? The services and screenings done by your health care provider during  your annual well check will depend on your age, overall health, lifestyle risk factors, and family history of disease. Counseling  Your health care provider may ask you questions about your:  Alcohol use.  Tobacco use.  Drug use.  Emotional well-being.  Home and relationship well-being.  Sexual activity.  Eating habits.  History of falls.  Memory and ability to understand (cognition).  Work and work Statistician.  Reproductive health. Screening  You may have the following tests or measurements:  Height, weight, and BMI.  Blood pressure.  Lipid and cholesterol levels. These may be checked every 5 years, or more frequently if you are over 20 years old.  Skin check.  Lung cancer screening. You may have this screening every year starting at age 60 if you have a 30-pack-year history of smoking and currently smoke or have quit within the past 15 years.  Fecal occult blood test (FOBT) of the stool. You may have this test every year starting at age 20.  Flexible sigmoidoscopy or colonoscopy. You may have a sigmoidoscopy every 5 years or a colonoscopy every 10 years starting at age 74.  Hepatitis C blood test.  Hepatitis B blood test.  Sexually transmitted disease (STD) testing.  Diabetes screening. This is done by checking your blood sugar (glucose) after you have not eaten for a while (fasting). You may have this done every 1-3 years.  Bone density scan. This is done to screen for osteoporosis. You may have this done starting at age 56.  Mammogram. This may be done every 1-2 years. Talk to your health care provider about how often you should have regular mammograms. Talk with your health  care provider about your test results, treatment options, and if necessary, the need for more tests. Vaccines  Your health care provider may recommend certain vaccines, such as:  Influenza vaccine. This is recommended every year.  Tetanus, diphtheria, and acellular pertussis (Tdap,  Td) vaccine. You may need a Td booster every 10 years.  Zoster vaccine. You may need this after age 65.  Pneumococcal 13-valent conjugate (PCV13) vaccine. One dose is recommended after age 63.  Pneumococcal polysaccharide (PPSV23) vaccine. One dose is recommended after age 46. Talk to your health care provider about which screenings and vaccines you need and how often you need them. This information is not intended to replace advice given to you by your health care provider. Make sure you discuss any questions you have with your health care provider. Document Released: 12/19/2015 Document Revised: 08/11/2016 Document Reviewed: 09/23/2015 Elsevier Interactive Patient Education  2017 [place] Prevention in the Home Falls can cause injuries. They can happen to people of all ages. There are many things you can do to make your home safe and to help prevent falls. What can I do on the outside of my home?  Regularly fix the edges of walkways and driveways and fix any cracks.  Remove anything that might make you trip as you walk through a door, such as a raised step or threshold.  Trim any bushes or trees on the path to your home.  Use bright outdoor lighting.  Clear any walking paths of anything that might make someone trip, such as rocks or tools.  Regularly check to see if handrails are loose or broken. Make sure that both sides of any steps have handrails.  Any raised decks and porches should have guardrails on the edges.  Have any leaves, snow, or ice cleared regularly.  Use sand or salt on walking paths during winter.  Clean up any spills in your garage right away. This includes oil or grease spills. What can I do in the bathroom?  Use night lights.  Install grab bars by the toilet and in the tub and shower. Do not use towel bars as grab bars.  Use non-skid mats or decals in the tub or shower.  If you need to sit down in the shower, use a plastic, non-slip  stool.  Keep the floor dry. Clean up any water that spills on the floor as soon as it happens.  Remove soap buildup in the tub or shower regularly.  Attach bath mats securely with double-sided non-slip rug tape.  Do not have throw rugs and other things on the floor that can make you trip. What can I do in the bedroom?  Use night lights.  Make sure that you have a light by your bed that is easy to reach.  Do not use any sheets or blankets that are too big for your bed. They should not hang down onto the floor.  Have a firm chair that has side arms. You can use this for support while you get dressed.  Do not have throw rugs and other things on the floor that can make you trip. What can I do in the kitchen?  Clean up any spills right away.  Avoid walking on wet floors.  Keep items that you use a lot in easy-to-reach places.  If you need to reach something above you, use a strong step stool that has a grab bar.  Keep electrical cords out of the way.  Do not use floor  polish or wax that makes floors slippery. If you must use wax, use non-skid floor wax.  Do not have throw rugs and other things on the floor that can make you trip. What can I do with my stairs?  Do not leave any items on the stairs.  Make sure that there are handrails on both sides of the stairs and use them. Fix handrails that are broken or loose. Make sure that handrails are as long as the stairways.  Check any carpeting to make sure that it is firmly attached to the stairs. Fix any carpet that is loose or worn.  Avoid having throw rugs at the top or bottom of the stairs. If you do have throw rugs, attach them to the floor with carpet tape.  Make sure that you have a light switch at the top of the stairs and the bottom of the stairs. If you do not have them, ask someone to add them for you. What else can I do to help prevent falls?  Wear shoes that:  Do not have high heels.  Have rubber bottoms.  Are  comfortable and fit you well.  Are closed at the toe. Do not wear sandals.  If you use a stepladder:  Make sure that it is fully opened. Do not climb a closed stepladder.  Make sure that both sides of the stepladder are locked into place.  Ask someone to hold it for you, if possible.  Clearly mark and make sure that you can see:  Any grab bars or handrails.  First and last steps.  Where the edge of each step is.  Use tools that help you move around (mobility aids) if they are needed. These include:  Canes.  Walkers.  Scooters.  Crutches.  Turn on the lights when you go into a dark area. Replace any light bulbs as soon as they burn out.  Set up your furniture so you have a clear path. Avoid moving your furniture around.  If any of your floors are uneven, fix them.  If there are any pets around you, be aware of where they are.  Review your medicines with your doctor. Some medicines can make you feel dizzy. This can increase your chance of falling. Ask your doctor what other things that you can do to help prevent falls. This information is not intended to replace advice given to you by your health care provider. Make sure you discuss any questions you have with your health care provider. Document Released: 09/18/2009 Document Revised: 04/29/2016 Document Reviewed: 12/27/2014 Elsevier Interactive Patient Education  2017 [employer].

## 2019-01-26 NOTE — Patient Instructions (Signed)
Health Maintenance After Age 77 After age 77, you are at a higher risk for certain long-term diseases and infections as well as injuries from falls. Falls are a major cause of broken bones and head injuries in people who are older than age 77. Getting regular preventive care can help to keep you healthy and well. Preventive care includes getting regular testing and making lifestyle changes as recommended by your health care provider. Talk with your health care provider about:  Which screenings and tests you should have. A screening is a test that checks for a disease when you have no symptoms.  A diet and exercise plan that is right for you. What should I know about screenings and tests to prevent falls? Screening and testing are the best ways to find a health problem early. Early diagnosis and treatment give you the best chance of managing medical conditions that are common after age 77. Certain conditions and lifestyle choices may make you more likely to have a fall. Your health care provider may recommend:  Regular vision checks. Poor vision and conditions such as cataracts can make you more likely to have a fall. If you wear glasses, make sure to get your prescription updated if your vision changes.  Medicine review. Work with your health care provider to regularly review all of the medicines you are taking, including over-the-counter medicines. Ask your health care provider about any side effects that may make you more likely to have a fall. Tell your health care provider if any medicines that you take make you feel dizzy or sleepy.  Osteoporosis screening. Osteoporosis is a condition that causes the bones to get weaker. This can make the bones weak and cause them to break more easily.  Blood pressure screening. Blood pressure changes and medicines to control blood pressure can make you feel dizzy.  Strength and balance checks. Your health care provider may recommend certain tests to check your  strength and balance while standing, walking, or changing positions.  Foot health exam. Foot pain and numbness, as well as not wearing proper footwear, can make you more likely to have a fall.  Depression screening. You may be more likely to have a fall if you have a fear of falling, feel emotionally low, or feel unable to do activities that you used to do.  Alcohol use screening. Using too much alcohol can affect your balance and may make you more likely to have a fall. What actions can I take to lower my risk of falls? General instructions  Talk with your health care provider about your risks for falling. Tell your health care provider if: ? You fall. Be sure to tell your health care provider about all falls, even ones that seem minor. ? You feel dizzy, sleepy, or off-balance.  Take over-the-counter and prescription medicines only as told by your health care provider. These include any supplements.  Eat a healthy diet and maintain a healthy weight. A healthy diet includes low-fat dairy products, low-fat (lean) meats, and fiber from whole grains, beans, and lots of fruits and vegetables. Home safety  Remove any tripping hazards, such as rugs, cords, and clutter.  Install safety equipment such as grab bars in bathrooms and safety rails on stairs.  Keep rooms and walkways well-lit. Activity   Follow a regular exercise program to stay fit. This will help you maintain your balance. Ask your health care provider what types of exercise are appropriate for you.  If you need a cane or   walker, use it as recommended by your health care provider.  Wear supportive shoes that have nonskid soles. Lifestyle  Do not drink alcohol if your health care provider tells you not to drink.  If you drink alcohol, limit how much you have: ? 0-1 drink a day for women. ? 0-2 drinks a day for men.  Be aware of how much alcohol is in your drink. In the [place] one drink equals one typical bottle of beer (12  oz), one-half glass of wine (5 oz), or one shot of hard liquor (1 oz).  Do not use any products that contain nicotine or tobacco, such as cigarettes and e-cigarettes. If you need help quitting, ask your health care provider. Summary  Having a healthy lifestyle and getting preventive care can help to protect your health and wellness after age 77.  Screening and testing are the best way to find a health problem early and help you avoid having a fall. Early diagnosis and treatment give you the best chance for managing medical conditions that are more common for people who are older than age 77.  Falls are a major cause of broken bones and head injuries in people who are older than age 77. Take precautions to prevent a fall at home.  Work with your health care provider to learn what changes you can make to improve your health and wellness and to prevent falls. This information is not intended to replace advice given to you by your health care provider. Make sure you discuss any questions you have with your health care provider. Document Released: 10/05/2017 Document Revised: 10/05/2017 Document Reviewed: 10/05/2017 Elsevier Interactive Patient Education  2019 [employer]  

## 2019-01-26 NOTE — Progress Notes (Signed)
Subjective:   GLENNETTE Harris is a 78 y.o. female who presents for Medicare Annual (Subsequent) preventive examination.  Review of Systems:  N/A  Cardiac Risk Factors include: advanced age (>14men, >79 women);dyslipidemia;hypertension     Objective:     Vitals: BP 134/88 (BP Location: Right Arm)   Pulse 76   Temp 97.7 F (36.5 C) (Oral)   Ht 5\' 8"  (1.727 m)   Wt 193 lb 12.8 oz (87.9 kg)   BMI 29.47 kg/m   Body mass index is 29.47 kg/m.  Advanced Directives 01/26/2019 03/13/2018 03/09/2018 12/28/2017 10/05/2017 12/21/2016 02/16/2016  Does Patient Have a Medical Advance Directive? Yes Yes Yes Yes Yes Yes Yes  Type of Advance Directive Living will;Healthcare Power of [place];Living will  Does patient want to make changes to medical advance directive? - No - Patient declined No - Patient declined - - - No - Patient declined  Copy of [place] in Chart? No - copy requested No - copy requested - - - - No - copy requested    Tobacco Social History   Tobacco Use  Smoking Status Former Smoker  . Packs/day: 1.00  . Years: 30.00  . Pack years: 30.00  . Types: Cigarettes  . Last attempt to quit: 02/09/1996  . Years since quitting: 22.9  Smokeless Tobacco Never Used     Counseling given: Not Answered   Clinical Intake:  Pre-visit preparation completed: Yes  Pain : No/denies pain Pain Score: 0-No pain     Nutritional Status: BMI 25 -29 Overweight Nutritional Risks: None Diabetes: No  How often do you need to have someone help you when you read instructions, pamphlets, or other written materials from your doctor or pharmacy?: 1 - Never  Interpreter Needed?: No  Information entered by :: [facility], LPN  Past Medical History:  Diagnosis Date  . Arthritis   . Atrial fibrillation ([place]) 10/01/14  . Complication of anesthesia    trouble waking up with Propofol    . Depression    h/o  . Dysrhythmia    a fib  . Family history of adverse reaction to anesthesia    daughter PONV  . Glaucoma    left eye  . Headache    H/O MIGRAINES  . Hemorrhoids   . Hiatal hernia   . History of kidney stones   . History of migraine headaches   . Hx MRSA infection   . Hypercholesteremia   . Kidney calculi   . Osteopenia   . PONV (postoperative nausea and vomiting)    Past Surgical History:  Procedure Laterality Date  . ABDOMINAL HYSTERECTOMY    . AUGMENTATION MAMMAPLASTY Bilateral 2004  . CARPAL TUNNEL RELEASE Right 02/16/2016   Procedure: CARPAL TUNNEL RELEASE;  Surgeon: Dereck Leep, MD;  Location: [facility] ORS;  Service: Orthopedics;  Laterality: Right;  . CARPAL TUNNEL RELEASE Left 04/12/2016   Procedure: CARPAL TUNNEL RELEASE;  Surgeon: Dereck Leep, MD;  Location: [facility] OR[facility];  Service: Orthopedics;  Laterality: Left;  . CARPAL TUNNEL RELEASE Right 03/13/2018   Procedure: CARPAL TUNNEL RELEASE;  Surgeon: Deetta Perla, MD;  Location: [facility] OR[facility];  Service: Neurosurgery;  Laterality: Right;  . CATARACT EXTRACTION, BILATERAL    . COLONOSCOPY  12/29/09   Dr Dionne Milo  . EYE SURGERY Left    shunt placed  . EYE SURGERY Left    tube placed  . FOOT SURGERY  bilateral   . JOINT REPLACEMENT    . NASAL SEPTUM SURGERY    . REPLACEMENT TOTAL KNEE     bilateral  . SHOULDER SURGERY     right shoulder   Family History  Problem Relation Age of Onset  . Arthritis Mother   . Arthritis Father   . Breast cancer Neg Hx    Social History   Socioeconomic History  . Marital status: Divorced    Spouse name: Not on file  . Number of children: 2  . Years of education: Not on file  . Highest education level: Some college, no degree  Occupational History  . Occupation: retired  Scientific laboratory technician  . Financial resource strain: Not hard at all  . Food insecurity:    Worry: Never true    Inability: Never true  . Transportation needs:    Medical: No    Non-medical: No   Tobacco Use  . Smoking status: Former Smoker    Packs/day: 1.00    Years: 30.00    Pack years: 30.00    Types: Cigarettes    Last attempt to quit: 02/09/1996    Years since quitting: 22.9  . Smokeless tobacco: Never Used  Substance and Sexual Activity  . Alcohol use: Yes    Comment: maybe twice a year- 1 mixed drink  . Drug use: No  . Sexual activity: Not on file  Lifestyle  . Physical activity:    Days per week: 0 days    Minutes per session: 0 min  . Stress: Very much  Relationships  . Social connections:    Talks on phone: Patient refused    Gets together: Patient refused    Attends religious service: Patient refused    Active member of club or organization: Patient refused    Attends meetings of clubs or organizations: Patient refused    Relationship status: Patient refused  Other Topics Concern  . Not on file  Social History Narrative  . Not on file    Outpatient Encounter Medications as of 01/26/2019  Medication Sig  . acetaminophen (TYLENOL) 500 MG tablet Take 500-1,000 mg by mouth every 6 (six) hours as needed for moderate pain or headache.  . Calcium Carbonate-Vitamin D (CALCIUM-VITAMIN D3 PO) Take 1 tablet by mouth 2 (two) times daily.  . celecoxib (CELEBREX) 200 MG capsule Take 1 capsule (200 mg total) by mouth 2 (two) times daily. (Patient taking differently: Take 200 mg by mouth 2 (two) times daily as needed. )  . cholecalciferol (VITAMIN D) 400 units TABS tablet Take 400 Units by mouth 2 (two) times daily.   . cycloSPORINE (RESTASIS) 0.05 % ophthalmic emulsion Place 1 drop into both eyes 2 (two) times daily.   . digoxin (LANOXIN) 0.125 MG tablet TAKE 1 TABLET BY MOUTH ONCE EVERY MORNING  . dorzolamide-timolol (COSOPT) 22.3-6.8 MG/ML ophthalmic solution Place 1 drop into both eyes 2 (two) times daily.   Marland Kitchen ELIQUIS 5 MG TABS tablet TAKE 1 TABLET BY MOUTH TWICE DAILY  . furosemide (LASIX) 20 MG tablet Take 1 tablet (20 mg total) by mouth daily.  Marland Kitchen latanoprost  (XALATAN) 0.005 % ophthalmic solution Place 1 drop into both eyes daily.   Marland Kitchen LYRICA 100 MG capsule TAKE 1 CAPSULE BY MOUTH ONCE DAILY  . Magnesium 400 MG CAPS Take 400 mg by mouth 2 (two) times daily.  . metoprolol succinate (TOPROL-XL) 25 MG 24 hr tablet Take 0.5 tablets (12.5 mg total) by mouth daily.  Marland Kitchen METRONIDAZOLE, TOPICAL, 0.75 %  LOTN Apply 1 application topically at bedtime.   . Multiple Vitamin (MULTIVITAMIN) tablet Take 1 tablet by mouth daily.  Marland Kitchen omeprazole (PRILOSEC) 40 MG capsule Take 40 mg by mouth every morning.   Marland Kitchen PREMARIN 0.625 MG tablet TAKE 1 TABLET BY MOUTH ONCE DAILY  . SOOLANTRA 1 % CREA Apply 1 application topically daily. Sometimes uses twice daily as needed for flare ups.  . zolpidem (AMBIEN) 10 MG tablet Take 0.5-1 tablets (5-10 mg total) by mouth at bedtime as needed for sleep.   No facility-administered encounter medications on file as of 01/26/2019.     Activities of Daily Living In your present state of health, do you have any difficulty performing the following activities: 01/26/2019 03/13/2018  Hearing? N N  Vision? Y N  Comment Due to Glaucoma, wears eye glasses daily.  -  Difficulty concentrating or making decisions? N N  Walking or climbing stairs? Y N  Comment Due to a-fib.  -  Dressing or bathing? N N  Doing errands, shopping? N -  Preparing Food and eating ? N -  Using the Toilet? N -  In the past six months, have you accidently leaked urine? N -  Do you have problems with loss of bowel control? N -  Managing your Medications? N -  Managing your Finances? N -  Some recent data might be hidden    Patient Care Team: [name], PA-C as PCP - General (Family Medicine) Wellington Hampshire, MD as Consulting Physician (Cardiology) Eulogio Bear, MD as Consulting Physician (Ophthalmology) Sharlet Salina, MD as Referring Physician (Physical Medicine and Rehabilitation) Donnie Mesa, MD as Consulting Physician (General  Surgery) Sudie Grumbling, MD as Referring Physician (Ophthalmology)    Assessment:   This is a routine wellness examination for Timya.  Exercise Activities and Dietary recommendations Current Exercise Habits: The patient does not participate in regular exercise at present, Exercise limited by: Other - see comments(eye surgeries in past year and limitations. )  Goals    . DIET - INCREASE WATER INTAKE     Recommend increasing water intake to 6-8 glasses a day.     Marland Kitchen DIET - REDUCE CALORIE INTAKE     Recommend to continue current diet plan of cutting down on calories and carbohydrates to help aid in weight loss.        Fall Risk Fall Risk  12/28/2017 12/21/2016 11/27/2015  Falls in the past year? No No No   FALL RISK PREVENTION PERTAINING TO THE HOME: Any stairs in or around the home? No  If so, do they handrails? N/A  Home free of loose throw rugs in walkways, pet beds, electrical cords, etc? Yes  Adequate lighting in your home to reduce risk of falls? Yes   ASSISTIVE DEVICES UTILIZED TO PREVENT FALLS:  Life alert? No  Use of a cane, walker or w/c? No  Grab bars in the bathroom? Yes  Shower chair or bench in shower? No  Elevated toilet seat or a handicapped toilet? Yes    TIMED UP AND GO:  Was the test performed? No .    Depression Screen PHQ 2/9 Scores 01/26/2019 12/28/2017 10/05/2017 12/21/2016  PHQ - 2 Score 0 0 1 0  PHQ- 9 Score 2 - - -     Cognitive Function: Declined today.         Immunization History  Administered Date(s) Administered  . Influenza, High Dose Seasonal PF 08/26/2016, 09/07/2017, 08/17/2018  . Influenza,inj,Quad PF,6+ Mos 09/09/2014  .  Influenza-Unspecified 10/08/2015  . Pneumococcal Conjugate-13 11/11/2014  . Pneumococcal Polysaccharide-23 10/27/2012  . Tdap 12/03/2010    Qualifies for Shingles Vaccine? Yes . Due for Shingrix. Education has been provided regarding the importance of this vaccine. Pt has been advised to call insurance  company to determine out of pocket expense. Advised may also receive vaccine at local pharmacy or Health Dept. Verbalized acceptance and understanding.  Tdap: Up to date  Flu Vaccine: Up to date  Pneumococcal Vaccine: Up to date  Screening Tests Health Maintenance  Topic Date Due  . TETANUS/TDAP  12/03/2020  . DEXA SCAN  02/08/2022  . INFLUENZA VACCINE  Completed  . PNA vac Low Risk Adult  Completed    Cancer Screenings:  Colorectal Screening: No longer required.   Mammogram: No longer required.   Bone Density: Completed 02/08/17. Results reflect OSTEOPENIA. Repeat every 5 years.  Lung Cancer Screening: (Low Dose CT Chest recommended if Age 60-80 years, 30 pack-year currently smoking OR have quit w/in 15years.) does not qualify.    Additional Screening:  Vision Screening: Recommended annual ophthalmology exams for early detection of glaucoma and other disorders of the eye.  Dental Screening: Recommended annual dental exams for proper oral hygiene  Community Resource Referral:  CRR required this visit?  No       Plan:  I have personally reviewed and addressed the Medicare Annual Wellness questionnaire and have noted the following in the patient's chart:  A. Medical and social history B. Use of alcohol, tobacco or illicit drugs  C. Current medications and supplements D. Functional ability and status E.  Nutritional status F.  Physical activity G. Advance directives H. List of other physicians I.  Hospitalizations, surgeries, and ER visits in previous 12 months J.  [place] such as hearing and vision if needed, cognitive and depression L. Referrals and appointments - none  In addition, I have reviewed and discussed with patient certain preventive protocols, quality metrics, and best practice recommendations. A written personalized care plan for preventive services as well as general preventive health recommendations were provided to patient.  See  attached scanned questionnaire for additional information.   Signed,  Fabio Neighbors, LPN Nurse Health Advisor   Nurse Recommendations: None.

## 2019-01-26 NOTE — Progress Notes (Signed)
Patient: Teresa Harris, Female    DOB: 1941-08-04, 78 y.o.   MRN: [id number] Visit Date: 01/26/2019  Today's Provider: [name], PA-C   Chief Complaint  Patient presents with  . Annual Exam   Subjective:     Complete Physical Teresa Harris is a 78 y.o. female. She feels well. She reports exercising none. She reports she is sleeping poorly, but better than before with using lyrica for RLS.  Mammogram:08/14/2018-Normal -----------------------------------------------------------   Review of Systems  Constitutional: Negative.   HENT: Negative.   Eyes: Positive for photophobia, pain, redness and visual disturbance.  Respiratory: Negative.   Cardiovascular: Negative.   Gastrointestinal: Negative.   Endocrine: Negative.   Genitourinary: Negative.   Musculoskeletal: Negative.   Skin: Negative.   Allergic/Immunologic: Negative.   Neurological: Negative.   Hematological: Negative.   Psychiatric/Behavioral: Negative for sleep disturbance.    Social History   Socioeconomic History  . Marital status: Divorced    Spouse name: Not on file  . Number of children: 2  . Years of education: Not on file  . Highest education level: Some college, no degree  Occupational History  . Occupation: retired  Scientific laboratory technician  . Financial resource strain: Not hard at all  . Food insecurity:    Worry: Never true    Inability: Never true  . Transportation needs:    Medical: No    Non-medical: No  Tobacco Use  . Smoking status: Former Smoker    Packs/day: 1.00    Years: 30.00    Pack years: 30.00    Types: Cigarettes    Last attempt to quit: 02/09/1996    Years since quitting: 22.9  . Smokeless tobacco: Never Used  Substance and Sexual Activity  . Alcohol use: Yes    Comment: maybe twice a year- 1 mixed drink  . Drug use: No  . Sexual activity: Not on file  Lifestyle  . Physical activity:    Days per week: 0 days    Minutes per session: 0 min  . Stress:  Very much  Relationships  . Social connections:    Talks on phone: Patient refused    Gets together: Patient refused    Attends religious service: Patient refused    Active member of club or organization: Patient refused    Attends meetings of clubs or organizations: Patient refused    Relationship status: Patient refused  . Intimate partner violence:    Fear of current or ex partner: Patient refused    Emotionally abused: Patient refused    Physically abused: Patient refused    Forced sexual activity: Patient refused  Other Topics Concern  . Not on file  Social History Narrative  . Not on file    Past Medical History:  Diagnosis Date  . Arthritis   . Atrial fibrillation ([place]) 10/01/14  . Complication of anesthesia    trouble waking up with Propofol  . Depression    h/o  . Dysrhythmia    a fib  . Family history of adverse reaction to anesthesia    daughter PONV  . Glaucoma    left eye  . Headache    H/O MIGRAINES  . Hemorrhoids   . Hiatal hernia   . History of kidney stones   . History of migraine headaches   . Hx MRSA infection   . Hypercholesteremia   . Kidney calculi   . Osteopenia   . PONV (postoperative nausea and vomiting)  Patient Active Problem List   Diagnosis Date Noted  . Neuropathy 08/17/2018  . Coccyalgia 01/27/2018  . Primary insomnia 01/27/2018  . Avitaminosis D 11/18/2015  . Osteopenia 11/18/2015  . HLD (hyperlipidemia) 11/18/2015  . High potassium 11/18/2015  . Post menopausal syndrome 11/18/2015  . 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 Atrial fibrillation, chronic 11/18/2015  . Adverse effect of anesthesia 11/18/2015  . Hypertension 06/23/2015  . Arthritis 05/22/2015  . Internal hemorrhoid 11/23/2014  . Rectal bleeding 11/23/2014  . Left-sided low back pain with left-sided sciatica 10/25/2014  . Pain in the chest 10/03/2014  . Hemorrhoid 10/03/2014  . Bursitis, trochanteric 07/16/2014  . Left lumbar radiculitis 07/16/2014  .  Chronic infection of sinus 08/10/2013  . Glaucoma 08/10/2013  . Osteoarthritis 08/10/2013  . PONV (postoperative nausea and vomiting) 08/10/2013  . Palpitations 07/05/2013    Past Surgical History:  Procedure Laterality Date  . ABDOMINAL HYSTERECTOMY    . AUGMENTATION MAMMAPLASTY Bilateral 2004  . CARPAL TUNNEL RELEASE Right 02/16/2016   Procedure: CARPAL TUNNEL RELEASE;  Surgeon: Dereck Leep, MD;  Location: [facility] OR[facility];  Service: Orthopedics;  Laterality: Right;  . CARPAL TUNNEL RELEASE Left 04/12/2016   Procedure: CARPAL TUNNEL RELEASE;  Surgeon: Dereck Leep, MD;  Location: [facility] OR[facility];  Service: Orthopedics;  Laterality: Left;  . CARPAL TUNNEL RELEASE Right 03/13/2018   Procedure: CARPAL TUNNEL RELEASE;  Surgeon: Deetta Perla, MD;  Location: [facility] OR[facility];  Service: Neurosurgery;  Laterality: Right;  . CATARACT EXTRACTION, BILATERAL    . COLONOSCOPY  12/29/09   Dr Dionne Milo  . EYE SURGERY Left    shunt placed  . EYE SURGERY Left    tube placed  . FOOT SURGERY     bilateral   . JOINT REPLACEMENT    . NASAL SEPTUM SURGERY    . REPLACEMENT TOTAL KNEE     bilateral  . SHOULDER SURGERY     right shoulder    Her family history includes Arthritis in her father and mother. There is no history of Breast cancer.   Current Outpatient Medications:  .  acetaminophen (TYLENOL) 500 MG tablet, Take 500-1,000 mg by mouth every 6 (six) hours as needed for moderate pain or headache., Disp: , Rfl:  .  Calcium Carbonate-Vitamin D (CALCIUM-VITAMIN D3 PO), Take 1 tablet by mouth 2 (two) times daily., Disp: , Rfl:  .  celecoxib (CELEBREX) 200 MG capsule, Take 1 capsule (200 mg total) by mouth 2 (two) times daily. (Patient taking differently: Take 200 mg by mouth 2 (two) times daily as needed. ), Disp: 60 capsule, Rfl: 5 .  cholecalciferol (VITAMIN D) 400 units TABS tablet, Take 400 Units by mouth 2 (two) times daily. , Disp: , Rfl:  .  cycloSPORINE (RESTASIS) 0.05 % ophthalmic emulsion, Place 1 drop into  both eyes 2 (two) times daily. , Disp: , Rfl:  .  digoxin (LANOXIN) 0.125 MG tablet, TAKE 1 TABLET BY MOUTH ONCE EVERY MORNING, Disp: 30 tablet, Rfl: 3 .  dorzolamide-timolol (COSOPT) 22.3-6.8 MG/ML ophthalmic solution, Place 1 drop into both eyes 2 (two) times daily. , Disp: , Rfl:  .  ELIQUIS 5 MG TABS tablet, TAKE 1 TABLET BY MOUTH TWICE DAILY, Disp: 60 tablet, Rfl: 6 .  furosemide (LASIX) 20 MG tablet, Take 1 tablet (20 mg total) by mouth daily., Disp: 90 tablet, Rfl: 1 .  latanoprost (XALATAN) 0.005 % ophthalmic solution, Place 1 drop into both eyes daily. , Disp: , Rfl:  .  LYRICA 100 MG capsule, TAKE  1 CAPSULE BY MOUTH ONCE DAILY, Disp: 90 capsule, Rfl: 1 .  Magnesium 400 MG CAPS, Take 400 mg by mouth 2 (two) times daily., Disp: , Rfl:  .  metoprolol succinate (TOPROL-XL) 25 MG 24 hr tablet, Take 0.5 tablets (12.5 mg total) by mouth daily., Disp: 90 tablet, Rfl: 1 .  METRONIDAZOLE, TOPICAL, 0.75 % LOTN, Apply 1 application topically at bedtime. , Disp: , Rfl:  .  Multiple Vitamin (MULTIVITAMIN) tablet, Take 1 tablet by mouth daily., Disp: , Rfl:  .  omeprazole (PRILOSEC) 40 MG capsule, Take 40 mg by mouth every morning. , Disp: , Rfl:  .  PREMARIN 0.625 MG tablet, TAKE 1 TABLET BY MOUTH ONCE DAILY, Disp: 90 tablet, Rfl: 3 .  SOOLANTRA 1 % CREA, Apply 1 application topically daily. Sometimes uses twice daily as needed for flare ups., Disp: , Rfl:  .  zolpidem (AMBIEN) 10 MG tablet, Take 0.5-1 tablets (5-10 mg total) by mouth at bedtime as needed for sleep., Disp: 30 tablet, Rfl: 5  Patient Care Team: [name], PA-C as PCP - General (Family Medicine) Wellington Hampshire, MD as Consulting Physician (Cardiology) Eulogio Bear, MD as Consulting Physician (Ophthalmology) Sharlet Salina, MD as Referring Physician (Physical Medicine and Rehabilitation) Donnie Mesa, MD as Consulting Physician (General Surgery) Sudie Grumbling, MD as Referring Physician  (Ophthalmology)     Objective:    Vitals: BP 134/88 (BP Location: Right Arm)   Pulse 76   Temp 97.7 F (36.5 C) (Oral)   Ht 5\' 8"  (1.727 m)   Wt 193 lb 12.8 oz (87.9 kg)   BMI 29.47 kg/m   Body mass index is 29.47 kg/m.  Physical Exam Vitals signs reviewed.  Constitutional:      General: She is not in acute distress.    Appearance: Normal appearance. She is well-developed. She is not diaphoretic.  HENT:     Head: Normocephalic and atraumatic.     Right Ear: Tympanic membrane, ear canal and external ear normal.     Left Ear: Tympanic membrane, ear canal and external ear normal.     Nose: Nose normal.     Mouth/Throat:     Mouth: Mucous membranes are moist.     Pharynx: Oropharynx is clear. No oropharyngeal exudate.  Eyes:     General: No scleral icterus.       Right eye: No discharge.        Left eye: No discharge.     Extraocular Movements: Extraocular movements intact.     Conjunctiva/sclera: Conjunctivae normal.     Pupils: Pupils are equal, round, and reactive to light.  Neck:     Musculoskeletal: Normal range of motion and neck supple.     Thyroid: No thyromegaly.     Vascular: No carotid bruit or JVD.     Trachea: No tracheal deviation.  Cardiovascular:     Rate and Rhythm: Normal rate. Rhythm irregularly irregular.     Pulses: Normal pulses.     Heart sounds: Normal heart sounds. No murmur. No friction rub. No gallop.   Pulmonary:     Effort: Pulmonary effort is normal. No respiratory distress.     Breath sounds: Normal breath sounds. No wheezing or rales.  Chest:     Chest wall: No tenderness.  Abdominal:     General: Bowel sounds are normal. There is no distension.     Palpations: Abdomen is soft. There is no mass.     Tenderness: There is no  abdominal tenderness. There is no guarding or rebound.  Musculoskeletal: Normal range of motion.        General: No tenderness.  Lymphadenopathy:     Cervical: No cervical adenopathy.  Skin:    General: Skin is  warm and dry.     Findings: No rash.  Neurological:     Mental Status: She is alert and oriented to person, place, and time.  Psychiatric:        Mood and Affect: Mood normal.        Behavior: Behavior normal.        Thought Content: Thought content normal.        Judgment: Judgment normal.     Activities of Daily Living In your present state of health, do you have any difficulty performing the following activities: 01/26/2019 03/13/2018  Hearing? N N  Vision? Y N  Comment Due to Glaucoma, wears eye glasses daily.  -  Difficulty concentrating or making decisions? N N  Walking or climbing stairs? Y N  Comment Due to a-fib.  -  Dressing or bathing? N N  Doing errands, shopping? N -  Preparing Food and eating ? N -  Using the Toilet? N -  In the past six months, have you accidently leaked urine? N -  Do you have problems with loss of bowel control? N -  Managing your Medications? N -  Managing your Finances? N -  Some recent data might be hidden    Fall Risk Assessment Fall Risk  12/28/2017 12/21/2016 11/27/2015  Falls in the past year? No No No     Depression Screen PHQ 2/9 Scores 01/26/2019 12/28/2017 10/05/2017 12/21/2016  PHQ - 2 Score 0 0 1 0  PHQ- 9 Score 2 - - -    No flowsheet data found.     Assessment & Plan:    Annual Physical Reviewed patient's Family Medical History Reviewed and updated list of patient's medical providers Assessment of cognitive impairment was done Assessed patient's functional ability Established a written schedule for health screening [place] Completed and Reviewed  Exercise Activities and Dietary recommendations Goals    . DIET - INCREASE WATER INTAKE     Recommend increasing water intake to 6-8 glasses a day.     Marland Kitchen DIET - REDUCE CALORIE INTAKE     Recommend to continue current diet plan of cutting down on calories and carbohydrates to help aid in weight loss.        Immunization History  Administered  Date(s) Administered  . Influenza, High Dose Seasonal PF 08/26/2016, 09/07/2017, 08/17/2018  . Influenza,inj,Quad PF,6+ Mos 09/09/2014  . Influenza-Unspecified 10/08/2015  . Pneumococcal Conjugate-13 11/11/2014  . Pneumococcal Polysaccharide-23 10/27/2012  . Tdap 12/03/2010    Health Maintenance  Topic Date Due  . Samul Dada  12/03/2020  . DEXA SCAN  02/08/2022  . INFLUENZA VACCINE  Completed  . PNA vac Low Risk Adult  Completed     Discussed health benefits of physical activity, and encouraged her to engage in regular exercise appropriate for her age and condition.    1. Annual physical exam Normal physical exam today. Will check labs as below and f/u pending lab results. If labs are stable and WNL she will not need to have these rechecked for one year at her next annual physical exam. She is to call the office in the meantime if she has any acute issue, questions or concerns.  2. Essential hypertension Stable. Continue metoprolol 12.5mg  XR  and furosemide 20mg  daily. Labs reviewed that were done at Dr. Tyrell Antonio office. Will check labs as below and f/u pending results. - CBC with Differential/Platelet - Lipid panel - Hepatic function panel  3. Pure hypercholesterolemia Diet controlled. Will check labs as below and f/u pending results. - CBC with Differential/Platelet - Lipid panel - Hepatic function panel  4. Chronic left-sided low back pain with left-sided sciatica Stable. Continue Lyrica, switching to generic when refills required.   5. Left lumbar radiculitis Stable. See above medical treatment plan.  6. Neuropathy See above medical treatment plan.  7. Atrial fibrillation, chronic Stable. Followed by Dr. Fletcher Anon, cardiology. ------------------------------------------------------------------------------------------------------------    [name], PA-C  [facility]

## 2019-02-02 DIAGNOSIS — E7800 Pure hypercholesterolemia, unspecified: Secondary | ICD-10-CM | POA: Diagnosis not present

## 2019-02-02 DIAGNOSIS — I10 Essential (primary) hypertension: Secondary | ICD-10-CM | POA: Diagnosis not present

## 2019-02-03 LAB — CBC WITH DIFFERENTIAL/PLATELET
Basophils Absolute: 0.1 10*3/uL (ref 0.0–0.2)
Basos: 1 %
EOS (ABSOLUTE): 0.2 10*3/uL (ref 0.0–0.4)
Eos: 3 %
Hematocrit: 41.9 % (ref 34.0–46.6)
Hemoglobin: 14.8 g/dL (ref 11.1–15.9)
Immature Grans (Abs): 0 10*3/uL (ref 0.0–0.1)
Immature Granulocytes: 0 %
Lymphocytes Absolute: 1.3 10*3/uL (ref 0.7–3.1)
Lymphs: 28 %
MCH: 33 pg (ref 26.6–33.0)
MCHC: 35.3 g/dL (ref 31.5–35.7)
MCV: 93 fL (ref 79–97)
Monocytes Absolute: 0.5 10*3/uL (ref 0.1–0.9)
Monocytes: 10 %
Neutrophils Absolute: 2.6 10*3/uL (ref 1.4–7.0)
Neutrophils: 58 %
Platelets: 172 10*3/uL (ref 150–450)
RBC: 4.49 x10E6/uL (ref 3.77–5.28)
RDW: 12.4 % (ref 11.7–15.4)
WBC: 4.5 10*3/uL (ref 3.4–10.8)

## 2019-02-03 LAB — HEPATIC FUNCTION PANEL
ALT: 35 IU/L — ABNORMAL HIGH (ref 0–32)
AST: 25 IU/L (ref 0–40)
Albumin: 3.9 g/dL (ref 3.7–4.7)
Alkaline Phosphatase: 45 IU/L (ref 39–117)
Bilirubin Total: 0.6 mg/dL (ref 0.0–1.2)
Bilirubin, Direct: 0.19 mg/dL (ref 0.00–0.40)
Total Protein: 6.9 g/dL (ref 6.0–8.5)

## 2019-02-03 LAB — LIPID PANEL
Chol/HDL Ratio: 4.3 ratio (ref 0.0–4.4)
Cholesterol, Total: 172 mg/dL (ref 100–199)
HDL: 40 mg/dL (ref >39)
Triglycerides: 420 mg/dL — ABNORMAL HIGH (ref 0–149)

## 2019-02-07 ENCOUNTER — Telehealth: Payer: Self-pay

## 2019-02-07 ENCOUNTER — Other Ambulatory Visit: Payer: Self-pay | Admitting: Cardiovascular Disease

## 2019-02-07 NOTE — Telephone Encounter (Signed)
Patient advised as below.  

## 2019-02-07 NOTE — Telephone Encounter (Signed)
lmtcb

## 2019-02-07 NOTE — Telephone Encounter (Signed)
-----   Message from [name], [place] sent at 02/06/2019  5:40 PM EST ----- Blood count is normal. Cholesterol stable from last year. Liver enzymes stable.

## 2019-02-08 ENCOUNTER — Encounter: Payer: Self-pay | Admitting: Cardiovascular Disease

## 2019-02-08 ENCOUNTER — Ambulatory Visit: Payer: PPO | Admitting: Cardiovascular Disease

## 2019-02-08 VITALS — BP 122/68 | HR 55 | Ht 68.0 in | Wt 191.0 lb

## 2019-02-08 DIAGNOSIS — I482 Chronic atrial fibrillation, unspecified: Secondary | ICD-10-CM | POA: Diagnosis not present

## 2019-02-08 DIAGNOSIS — I5032 Chronic diastolic (congestive) heart failure: Secondary | ICD-10-CM

## 2019-02-08 NOTE — Patient Instructions (Signed)

## 2019-02-08 NOTE — Progress Notes (Signed)
Cardiology Office Note   Date:  02/08/2019   ID:  Teresa Harris, DOB 1941/05/22, MRN [id number]  PCP:  [name], PA-C  Cardiologist:   Kathlyn Sacramento, MD   Chief Complaint  Patient presents with  . Other    follow up post ECHO. Patient c/o some SOB at night. Patient denies chest pain. Meds reviewed verbally with patient.       History of Present Illness: Teresa Harris is a 78 y.o. female who presents for a follow-up regarding chronic atrial fibrillation.  She has borderline diabetes and hyperlipidemia not requiring medications. She smoked in the past more than 30 years ago. There is no family history of coronary artery disease or arrhythmia.  Nuclear stress test in November 2015 which showed no evidence of ischemia with normal ejection fraction. Echocardiogram showed normal EF with mild to moderate mitral regurgitation and mildly dilated left atrium.  She is only able to tolerate small dose metoprolol due to extreme fatigue.  Digoxin is being used for rate control.   She was seen in January of increased shortness of breath, leg edema and weight gain.  She appeared to be mildly volume overloaded.  I did routine labs which were unremarkable.  BNP was mildly elevated in the 200 range and digoxin level was 0.5. Echocardiogram showed normal LV systolic function with mild mitral regurgitation.  Systolic pulmonary pressure was mildly elevated at 35 mmHg.  I started her on small dose furosemide 20 mg once daily.  She reports improvement in symptoms.  No chest pain.  Past Medical History:  Diagnosis Date  . Arthritis   . Atrial fibrillation ([place]) 10/01/14  . Complication of anesthesia    trouble waking up with Propofol  . Depression    h/o  . Dysrhythmia    a fib  . Family history of adverse reaction to anesthesia    daughter PONV  . Glaucoma    left eye  . Headache    H/O MIGRAINES  . Hemorrhoids   . Hiatal hernia   . History of kidney stones   . History of  migraine headaches   . Hx MRSA infection   . Hypercholesteremia   . Kidney calculi   . Osteopenia   . PONV (postoperative nausea and vomiting)     Past Surgical History:  Procedure Laterality Date  . ABDOMINAL HYSTERECTOMY    . AUGMENTATION MAMMAPLASTY Bilateral 2004  . CARPAL TUNNEL RELEASE Right 02/16/2016   Procedure: CARPAL TUNNEL RELEASE;  Surgeon: Dereck Leep, MD;  Location: [facility];  Service: Orthopedics;  Laterality: Right;  . CARPAL TUNNEL RELEASE Left 04/12/2016   Procedure: CARPAL TUNNEL RELEASE;  Surgeon: Dereck Leep, MD;  Location: [facility];  Service: Orthopedics;  Laterality: Left;  . CARPAL TUNNEL RELEASE Right 03/13/2018   Procedure: CARPAL TUNNEL RELEASE;  Surgeon: Deetta Perla, MD;  Location: [facility] ORS;  Service: Neurosurgery;  Laterality: Right;  . CATARACT EXTRACTION, BILATERAL    . COLONOSCOPY  12/29/09   Dr Dionne Milo  . EYE SURGERY Left    shunt placed  . EYE SURGERY Left    tube placed  . FOOT SURGERY     bilateral   . JOINT REPLACEMENT    . NASAL SEPTUM SURGERY    . REPLACEMENT TOTAL KNEE     bilateral  . SHOULDER SURGERY     right shoulder     Current Outpatient Medications  Medication Sig Dispense Refill  . acetaminophen (TYLENOL) 500 MG  tablet Take 500-1,000 mg by mouth every 6 (six) hours as needed for moderate pain or headache.    . Calcium Carbonate-Vitamin D (CALCIUM-VITAMIN D3 PO) Take 1 tablet by mouth 2 (two) times daily.    . celecoxib (CELEBREX) 200 MG capsule Take 200 mg by mouth 2 (two) times daily as needed.    . cholecalciferol (VITAMIN D) 400 units TABS tablet Take 400 Units by mouth 2 (two) times daily.     . cycloSPORINE (RESTASIS) 0.05 % ophthalmic emulsion Place 1 drop into both eyes 2 (two) times daily.     . digoxin (LANOXIN) 0.125 MG tablet TAKE 1 TABLET BY MOUTH ONCE EVERY MORNING 30 tablet 3  . dorzolamide-timolol (COSOPT) 22.3-6.8 MG/ML ophthalmic solution Place 1 drop into both eyes 2 (two) times daily.     Marland Kitchen ELIQUIS 5 MG  TABS tablet TAKE 1 TABLET BY MOUTH TWICE DAILY 60 tablet 6  . furosemide (LASIX) 20 MG tablet Take 1 tablet (20 mg total) by mouth daily. 90 tablet 1  . latanoprost (XALATAN) 0.005 % ophthalmic solution Place 1 drop into both eyes daily.     . Magnesium 400 MG CAPS Take 400 mg by mouth 2 (two) times daily.    . metoprolol succinate (TOPROL-XL) 25 MG 24 hr tablet Take 0.5 tablets (12.5 mg total) by mouth daily. 90 tablet 1  . METRONIDAZOLE, TOPICAL, 0.75 % LOTN Apply 1 application topically at bedtime.     . Multiple Vitamin (MULTIVITAMIN) tablet Take 1 tablet by mouth daily.    Marland Kitchen omeprazole (PRILOSEC) 40 MG capsule Take 40 mg by mouth every morning.     . pregabalin (LYRICA) 100 MG capsule Take 1 capsule (100 mg total) by mouth daily. 90 capsule 0  . PREMARIN 0.625 MG tablet TAKE 1 TABLET BY MOUTH ONCE DAILY 90 tablet 3  . SOOLANTRA 1 % CREA Apply 1 application topically daily. Sometimes uses twice daily as needed for flare ups.    . zolpidem (AMBIEN) 10 MG tablet Take 0.5-1 tablets (5-10 mg total) by mouth at bedtime as needed for sleep. 30 tablet 5   No current facility-administered medications for this visit.     Allergies:   [name] [moxifloxacin hcl in nacl]; Bimatoprost; Brimonidine; Mobic [meloxicam]; Propofol; Travoprost; Vicodin [hydrocodone-acetaminophen]; and Cephalexin    Social History:  The patient  reports that she quit smoking about 23 years ago. Her smoking use included cigarettes. She has a 30.00 pack-year smoking history. She has never used smokeless tobacco. She reports current alcohol use. She reports that she does not use drugs.   Family History:  The patient's family history includes Arthritis in her father and mother.    ROS:  Please see the history of present illness.   Otherwise, review of systems are positive for none.   All other systems are reviewed and negative.    PHYSICAL EXAM: VS:  BP 122/68 (BP Location: Left Arm, Patient Position: Sitting, Cuff Size:  Normal)   Pulse (!) 55   Ht 5\' 8"  (1.727 m)   Wt 191 lb (86.6 kg)   BMI 29.04 kg/m  , BMI Body mass index is 29.04 kg/m. GEN: Well nourished, well developed, in no acute distress  HEENT: normal  Neck: No JVD, carotid bruits, or masses Cardiac: Irregularly irregular ; no murmurs, rubs, or gallops, trace bilateral leg edema edema  Respiratory:  clear to auscultation bilaterally, normal work of breathing GI: soft, nontender, nondistended, + BS MS: no deformity or atrophy  Skin: warm  and dry, no rash Neuro:  Strength and sensation are intact Psych: euthymic mood, full affect   EKG:  EKG is not ordered today.  Recent Labs: 12/07/2018: B Natriuretic Peptide 285.0; TSH 1.901 12/15/2018: BUN 19; Creatinine, Ser 0.87; Potassium 4.8; Sodium 139 02/02/2019: ALT 35; Hemoglobin 14.8; Platelets 172    Lipid Panel    Component Value Date/Time   CHOL 172 02/02/2019 1135   TRIG 420 (H) 02/02/2019 1135   HDL 40 02/02/2019 1135   CHOLHDL 4.3 02/02/2019 1135   LDLCALC Comment 02/02/2019 1135      Wt Readings from Last 3 Encounters:  02/08/19 191 lb (86.6 kg)  01/26/19 193 lb 12.8 oz (87.9 kg)  12/07/18 194 lb 12 oz (88.3 kg)       ASSESSMENT AND PLAN:  1. Chronic atrial fibrillation: Ventricular rate is reasonably controlled with small dose digoxin and metoprolol. She is tolerating anticoagulation with no side effects.    2.  Chronic diastolic heart failure: Recent echocardiogram was showing overall.  She improved with small dose furosemide 20 mg once daily.  Subsequent labs were unremarkable.     Disposition:   FU with me in 6 months  Signed,  Kathlyn Sacramento, MD  02/08/2019 11:33 AM    [place]

## 2019-02-16 ENCOUNTER — Telehealth: Payer: Self-pay | Admitting: Physician Assistant

## 2019-02-16 DIAGNOSIS — G2581 Restless legs syndrome: Secondary | ICD-10-CM

## 2019-02-16 MED ORDER — PREGABALIN 100 MG PO CAPS: 100.0000 mg | ORAL_CAPSULE | Freq: Every day | ORAL | 0 refills | Status: DC

## 2019-02-16 NOTE — Telephone Encounter (Signed)
refilled 

## 2019-02-16 NOTE — Telephone Encounter (Signed)
Refilled generic

## 2019-02-16 NOTE — Telephone Encounter (Signed)
Please advise 

## 2019-02-16 NOTE — Telephone Encounter (Signed)
Patient wants generic Lyrica and it was written for DAW.   Please let pharmacy know if he can give generic.

## 2019-05-08 DIAGNOSIS — M5136 Other intervertebral disc degeneration, lumbar region: Secondary | ICD-10-CM | POA: Diagnosis not present

## 2019-05-08 DIAGNOSIS — M5416 Radiculopathy, lumbar region: Secondary | ICD-10-CM | POA: Diagnosis not present

## 2019-05-09 DIAGNOSIS — H02834 Dermatochalasis of left upper eyelid: Secondary | ICD-10-CM | POA: Diagnosis not present

## 2019-05-09 DIAGNOSIS — H02831 Dermatochalasis of right upper eyelid: Secondary | ICD-10-CM | POA: Diagnosis not present

## 2019-05-09 DIAGNOSIS — H04123 Dry eye syndrome of bilateral lacrimal glands: Secondary | ICD-10-CM | POA: Diagnosis not present

## 2019-05-09 DIAGNOSIS — H02403 Unspecified ptosis of bilateral eyelids: Secondary | ICD-10-CM | POA: Diagnosis not present

## 2019-05-16 ENCOUNTER — Telehealth: Payer: Self-pay

## 2019-05-16 DIAGNOSIS — G2581 Restless legs syndrome: Secondary | ICD-10-CM

## 2019-05-16 DIAGNOSIS — G629 Polyneuropathy, unspecified: Secondary | ICD-10-CM

## 2019-05-16 MED ORDER — LYRICA 100 MG PO CAPS: 100.0000 mg | ORAL_CAPSULE | Freq: Every day | ORAL | 1 refills | Status: DC

## 2019-05-16 NOTE — Telephone Encounter (Signed)
Sent in brand

## 2019-05-16 NOTE — Telephone Encounter (Signed)
Patient states that she wants be put back on the brand name of the Lyrica instead of the generic.

## 2019-05-17 DIAGNOSIS — H401123 Primary open-angle glaucoma, left eye, severe stage: Secondary | ICD-10-CM | POA: Diagnosis not present

## 2019-05-18 ENCOUNTER — Other Ambulatory Visit: Payer: Self-pay | Admitting: Physician Assistant

## 2019-05-18 DIAGNOSIS — N951 Menopausal and female climacteric states: Secondary | ICD-10-CM

## 2019-05-21 DIAGNOSIS — M5416 Radiculopathy, lumbar region: Secondary | ICD-10-CM | POA: Diagnosis not present

## 2019-05-21 DIAGNOSIS — M5136 Other intervertebral disc degeneration, lumbar region: Secondary | ICD-10-CM | POA: Diagnosis not present

## 2019-05-21 DIAGNOSIS — M533 Sacrococcygeal disorders, not elsewhere classified: Secondary | ICD-10-CM | POA: Diagnosis not present

## 2019-05-22 ENCOUNTER — Telehealth: Payer: Self-pay | Admitting: Physician Assistant

## 2019-05-22 NOTE — Telephone Encounter (Signed)
It was sent on 05/16/19 and I know we received a PA. I do not know the result of the PA yet.

## 2019-05-22 NOTE — Telephone Encounter (Signed)
Pharmacy called wanting to know the status of the Lyrica 100mg  for the patient.  She does not want to use the generic and insurance will not pay for the name brand unless written by a doctor.  Pharmacy would like to fill the rx by tomorrow for this patient.  Thanks [employer]

## 2019-05-23 NOTE — Telephone Encounter (Signed)
Called the pharmacy and told Lyrica was approved.

## 2019-06-04 ENCOUNTER — Other Ambulatory Visit: Payer: Self-pay | Admitting: Cardiovascular Disease

## 2019-07-05 ENCOUNTER — Other Ambulatory Visit: Payer: Self-pay

## 2019-07-08 ENCOUNTER — Other Ambulatory Visit: Payer: Self-pay | Admitting: Cardiovascular Disease

## 2019-08-28 ENCOUNTER — Other Ambulatory Visit: Payer: Self-pay | Admitting: Physician Assistant

## 2019-08-28 ENCOUNTER — Telehealth: Payer: Self-pay | Admitting: Physician Assistant

## 2019-08-28 DIAGNOSIS — L718 Other rosacea: Secondary | ICD-10-CM | POA: Diagnosis not present

## 2019-08-28 DIAGNOSIS — L570 Actinic keratosis: Secondary | ICD-10-CM | POA: Diagnosis not present

## 2019-08-28 DIAGNOSIS — L821 Other seborrheic keratosis: Secondary | ICD-10-CM | POA: Diagnosis not present

## 2019-08-28 DIAGNOSIS — L918 Other hypertrophic disorders of the skin: Secondary | ICD-10-CM | POA: Diagnosis not present

## 2019-08-28 DIAGNOSIS — Z1239 Encounter for other screening for malignant neoplasm of breast: Secondary | ICD-10-CM

## 2019-08-28 DIAGNOSIS — L578 Other skin changes due to chronic exposure to nonionizing radiation: Secondary | ICD-10-CM | POA: Diagnosis not present

## 2019-08-28 DIAGNOSIS — Z872 Personal history of diseases of the skin and subcutaneous tissue: Secondary | ICD-10-CM | POA: Diagnosis not present

## 2019-08-28 NOTE — Telephone Encounter (Signed)
Pt called wanting Tawanna Sat to put in an order for a mammogram.  Pt wants to make the appt herself.  Please call patient once order is in  [phone]  [facility]

## 2019-08-29 ENCOUNTER — Ambulatory Visit (INDEPENDENT_AMBULATORY_CARE_PROVIDER_SITE_OTHER): Payer: PPO

## 2019-08-29 ENCOUNTER — Other Ambulatory Visit: Payer: Self-pay

## 2019-08-29 DIAGNOSIS — Z23 Encounter for immunization: Secondary | ICD-10-CM | POA: Diagnosis not present

## 2019-08-31 ENCOUNTER — Telehealth: Payer: Self-pay | Admitting: *Deleted

## 2019-08-31 ENCOUNTER — Ambulatory Visit
Admission: RE | Admit: 2019-08-31 | Discharge: 2019-08-31 | Disposition: A | Discharge status: Home / Self Care | Payer: PPO | Source: Ambulatory Visit | Attending: Physician Assistant | Admitting: Physician Assistant

## 2019-08-31 DIAGNOSIS — Z1231 Encounter for screening mammogram for malignant neoplasm of breast: Secondary | ICD-10-CM | POA: Diagnosis not present

## 2019-08-31 DIAGNOSIS — Z1239 Encounter for other screening for malignant neoplasm of breast: Secondary | ICD-10-CM | POA: Diagnosis present

## 2019-08-31 NOTE — Telephone Encounter (Signed)
-----   Message from [name], [place] sent at 08/31/2019  1:42 PM EDT ----- Normal mammogram. Repeat screening in one year.

## 2019-08-31 NOTE — Telephone Encounter (Signed)
LMOVM for pt to return call 

## 2019-09-12 DIAGNOSIS — H02403 Unspecified ptosis of bilateral eyelids: Secondary | ICD-10-CM | POA: Diagnosis not present

## 2019-09-12 DIAGNOSIS — H02831 Dermatochalasis of right upper eyelid: Secondary | ICD-10-CM | POA: Diagnosis not present

## 2019-09-12 DIAGNOSIS — H02834 Dermatochalasis of left upper eyelid: Secondary | ICD-10-CM | POA: Diagnosis not present

## 2019-09-12 DIAGNOSIS — H04123 Dry eye syndrome of bilateral lacrimal glands: Secondary | ICD-10-CM | POA: Diagnosis not present

## 2019-09-12 DIAGNOSIS — H57813 Brow ptosis, bilateral: Secondary | ICD-10-CM | POA: Diagnosis not present

## 2019-09-12 DIAGNOSIS — H029 Unspecified disorder of eyelid: Secondary | ICD-10-CM | POA: Diagnosis not present

## 2019-09-13 ENCOUNTER — Other Ambulatory Visit: Payer: Self-pay | Admitting: Cardiovascular Disease

## 2019-09-13 NOTE — Telephone Encounter (Signed)
Pt overdue for 6 month f/u.  Please contact pt for future appointment. 

## 2019-09-14 NOTE — Telephone Encounter (Signed)
No ans no vm   °

## 2019-09-19 ENCOUNTER — Other Ambulatory Visit: Payer: Self-pay | Admitting: Cardiovascular Disease

## 2019-09-19 ENCOUNTER — Other Ambulatory Visit: Payer: Self-pay

## 2019-09-19 DIAGNOSIS — Z7901 Long term (current) use of anticoagulants: Secondary | ICD-10-CM

## 2019-09-19 NOTE — Telephone Encounter (Signed)
lmov to schedule  °

## 2019-09-19 NOTE — Telephone Encounter (Signed)
Please review for refill. Thanks!  

## 2019-10-05 ENCOUNTER — Other Ambulatory Visit: Payer: Self-pay | Admitting: Cardiovascular Disease

## 2019-10-10 ENCOUNTER — Other Ambulatory Visit: Payer: Self-pay | Admitting: Physician Assistant

## 2019-10-10 DIAGNOSIS — G629 Polyneuropathy, unspecified: Secondary | ICD-10-CM

## 2019-10-10 DIAGNOSIS — G2581 Restless legs syndrome: Secondary | ICD-10-CM

## 2019-10-17 ENCOUNTER — Other Ambulatory Visit: Payer: Self-pay | Admitting: Physician Assistant

## 2019-10-17 ENCOUNTER — Other Ambulatory Visit: Payer: Self-pay | Admitting: Cardiovascular Disease

## 2019-10-17 NOTE — Telephone Encounter (Signed)
Please review for refill. Thanks!  

## 2019-10-17 NOTE — Telephone Encounter (Signed)
Pt's age 78, wt 86.6 kg, SCr 0.87, CrCl 74.03 last ov w/ Dr. Fletcher Anon 02/08/19.

## 2019-11-09 ENCOUNTER — Ambulatory Visit (INDEPENDENT_AMBULATORY_CARE_PROVIDER_SITE_OTHER): Payer: PPO | Admitting: Cardiovascular Disease

## 2019-11-09 ENCOUNTER — Other Ambulatory Visit: Payer: Self-pay

## 2019-11-09 ENCOUNTER — Encounter: Payer: Self-pay | Admitting: Cardiovascular Disease

## 2019-11-09 VITALS — BP 110/80 | HR 65 | Ht 68.0 in | Wt 191.2 lb

## 2019-11-09 DIAGNOSIS — I482 Chronic atrial fibrillation, unspecified: Secondary | ICD-10-CM

## 2019-11-09 DIAGNOSIS — I5032 Chronic diastolic (congestive) heart failure: Secondary | ICD-10-CM

## 2019-11-09 DIAGNOSIS — I10 Essential (primary) hypertension: Secondary | ICD-10-CM | POA: Diagnosis not present

## 2019-11-09 NOTE — Progress Notes (Signed)
Cardiology Office Note   Date:  11/09/2019   ID:  Teresa Harris, DOB [birth year], MRN [id number]  PCP:  Teresa Daring, PA-C  Cardiologist:   Kathlyn Sacramento, MD   Chief Complaint  Patient presents with  . Other    6 monthf ollow up. patient denies chest pain and SOB at this time. Meds reviewed verbally with patient.       History of Present Illness: Teresa Harris is a 78 y.o. female who presents for a follow-up regarding chronic atrial fibrillation.  She has borderline diabetes and hyperlipidemia not requiring medications. She smoked in the past more than 30 years ago. There is no family history of coronary artery disease or arrhythmia.  Nuclear stress test in November 2015 which showed no evidence of ischemia with normal ejection fraction. Echocardiogram showed normal EF with mild to moderate mitral regurgitation and mildly dilated left atrium.  She is only able to tolerate small dose metoprolol due to extreme fatigue.  Digoxin is being used for rate control.    She did have mild diastolic heart failure early this year that required adding small dose furosemide.  Echocardiogram in January showed an EF of 60 to 65% with mild pulmonary hypertension. She has been doing well with no recent chest pain, shortness of breath or palpitations.  No dizziness.  No side effects with Eliquis.  Past Medical History:  Diagnosis Date  . Arthritis   . Atrial fibrillation ([place]) 10/01/14  . Complication of anesthesia    trouble waking up with Propofol  . Depression    h/o  . Dysrhythmia    a fib  . Family history of adverse reaction to anesthesia    daughter PONV  . Glaucoma    left eye  . Headache    H/O MIGRAINES  . Hemorrhoids   . Hiatal hernia   . History of kidney stones   . History of migraine headaches   . Hx MRSA infection   . Hypercholesteremia   . Kidney calculi   . Osteopenia   . PONV (postoperative nausea and vomiting)     Past Surgical History:   Procedure Laterality Date  . ABDOMINAL HYSTERECTOMY    . AUGMENTATION MAMMAPLASTY Bilateral 2004  . CARPAL TUNNEL RELEASE Right 02/16/2016   Procedure: CARPAL TUNNEL RELEASE;  Surgeon: Dereck Leep, MD;  Location: [facility] ORS;  Service: Orthopedics;  Laterality: Right;  . CARPAL TUNNEL RELEASE Left 04/12/2016   Procedure: CARPAL TUNNEL RELEASE;  Surgeon: Dereck Leep, MD;  Location: [facility] ORS;  Service: Orthopedics;  Laterality: Left;  . CARPAL TUNNEL RELEASE Right 03/13/2018   Procedure: CARPAL TUNNEL RELEASE;  Surgeon: Deetta Perla, MD;  Location: [facility] ORS;  Service: Neurosurgery;  Laterality: Right;  . CATARACT EXTRACTION, BILATERAL    . COLONOSCOPY  12/29/09   Dr Dionne Milo  . EYE SURGERY Left    shunt placed  . EYE SURGERY Left    tube placed  . FOOT SURGERY     bilateral   . JOINT REPLACEMENT    . NASAL SEPTUM SURGERY    . REPLACEMENT TOTAL KNEE     bilateral  . SHOULDER SURGERY     right shoulder     Current Outpatient Medications  Medication Sig Dispense Refill  . celecoxib (CELEBREX) 200 MG capsule Take 200 mg by mouth daily.    Marland Kitchen acetaminophen (TYLENOL) 500 MG tablet Take 500-1,000 mg by mouth every 6 (six) hours as needed for moderate pain or headache.    Marland Kitchen  Calcium Carbonate-Vitamin D (CALCIUM-VITAMIN D3 PO) Take 1 tablet by mouth 2 (two) times daily.    . cholecalciferol (VITAMIN D) 400 units TABS tablet Take 400 Units by mouth 2 (two) times daily.     . cycloSPORINE (RESTASIS) 0.05 % ophthalmic emulsion Place 1 drop into both eyes 2 (two) times daily.     . digoxin (LANOXIN) 0.125 MG tablet TAKE 1 TABLET BY MOUTH ONCE EVERY MORNING 30 tablet 2  . dorzolamide-timolol (COSOPT) 22.3-6.8 MG/ML ophthalmic solution Place 1 drop into both eyes 2 (two) times daily.     Marland Kitchen ELIQUIS 5 MG TABS tablet TAKE 1 TABLET BY MOUTH TWICE DAILY 60 tablet 0  . furosemide (LASIX) 20 MG tablet TAKE 1 TABLET BY MOUTH ONCE DAILY 90 tablet 0  . latanoprost (XALATAN) 0.005 % ophthalmic solution Place 1  drop into both eyes daily.     Marland Kitchen LYRICA 100 MG capsule TAKE 1 CAPSULE BY MOUTH ONCE DAILY 30 capsule 0  . Magnesium 400 MG CAPS Take 400 mg by mouth daily.     . metoprolol succinate (TOPROL-XL) 25 MG 24 hr tablet Take 0.5 tablets (12.5 mg total) by mouth daily. 90 tablet 1  . METRONIDAZOLE, TOPICAL, 0.75 % LOTN Apply 1 application topically at bedtime.     . Multiple Vitamin (MULTIVITAMIN) tablet Take 1 tablet by mouth daily.    Marland Kitchen omeprazole (PRILOSEC) 40 MG capsule Take 40 mg by mouth every morning.     Marland Kitchen PREMARIN 0.625 MG tablet TAKE 1 TABLET BY MOUTH ONCE DAILY 90 tablet 3  . SOOLANTRA 1 % CREA Apply 1 application topically daily. Sometimes uses twice daily as needed for flare ups.     No current facility-administered medications for this visit.     Allergies:   [name] [moxifloxacin hcl in nacl], Bimatoprost, Brimonidine, Mobic [meloxicam], Propofol, Travoprost, Vicodin [hydrocodone-acetaminophen], and Cephalexin    Social History:  The patient  reports that she quit smoking about 23 years ago. Her smoking use included cigarettes. She has a 30.00 pack-year smoking history. She has never used smokeless tobacco. She reports current alcohol use. She reports that she does not use drugs.   Family History:  The patient's family history includes Arthritis in her father and mother.    ROS:  Please see the history of present illness.   Otherwise, review of systems are positive for none.   All other systems are reviewed and negative.    PHYSICAL EXAM: VS:  BP 110/80 (BP Location: Left Arm, Patient Position: Sitting, Cuff Size: Normal)   Pulse 65   Ht 5\' 8"  (1.727 m)   Wt 191 lb 4 oz (86.8 kg)   BMI 29.08 kg/m  , BMI Body mass index is 29.08 kg/m. GEN: Well nourished, well developed, in no acute distress  HEENT: normal  Neck: No JVD, carotid bruits, or masses Cardiac: Irregularly irregular ; no murmurs, rubs, or gallops, trace bilateral leg edema edema  Respiratory:  clear to  auscultation bilaterally, normal work of breathing GI: soft, nontender, nondistended, + BS MS: no deformity or atrophy  Skin: warm and dry, no rash Neuro:  Strength and sensation are intact Psych: euthymic mood, full affect   EKG:  EKG is  ordered today. EKG showed atrial fibrillation with ventricular rate of 65 bpm.  ST changes likely due to digoxin.  Recent Labs: 12/07/2018: B Natriuretic Peptide 285.0; TSH 1.901 12/15/2018: BUN 19; Creatinine, Ser 0.87; Potassium 4.8; Sodium 139 02/02/2019: ALT 35; Hemoglobin 14.8; Platelets 172  Lipid Panel    Component Value Date/Time   CHOL 172 02/02/2019 1135   TRIG 420 (H) 02/02/2019 1135   HDL 40 02/02/2019 1135   CHOLHDL 4.3 02/02/2019 1135   LDLCALC Comment 02/02/2019 1135      Wt Readings from Last 3 Encounters:  11/09/19 191 lb 4 oz (86.8 kg)  02/08/19 191 lb (86.6 kg)  01/26/19 193 lb 12.8 oz (87.9 kg)       ASSESSMENT AND PLAN:  1. Chronic atrial fibrillation: Ventricular rate is well controlled with small dose digoxin and metoprolol. She is tolerating anticoagulation with no side effects.  I requested routine labs today including digoxin level.  2.  Chronic diastolic heart failure: She appears to be euvolemic on small dose furosemide.  Check basic metabolic profile.    Disposition:   FU with me in 6 months  Signed,  Kathlyn Sacramento, MD  11/09/2019 2:54 PM    [place]

## 2019-11-09 NOTE — Patient Instructions (Signed)
Medication Instructions:  Your physician recommends that you continue on your current medications as directed. Please refer to the Current Medication list given to you today.  *If you need a refill on your cardiac medications before your next appointment, please call your pharmacy*  Lab Work: Bmet, Cbc, Digoxin level today If you have labs (blood work) drawn today and your tests are completely normal, you will receive your results only by: Marland Kitchen MyChart Message (if you have MyChart) OR . A paper copy in the mail If you have any lab test that is abnormal or we need to change your treatment, we will call you to review the results.  Testing/Procedures: None ordered  Follow-Up: At [facility], you and your health needs are our priority.  As part of our continuing mission to provide you with exceptional heart care, we have created designated Provider Care Teams.  These Care Teams include your primary Cardiologist (physician) and Advanced Practice Providers (APPs -  Physician Assistants and Nurse Practitioners) who all work together to provide you with the care you need, when you need it.  Your next appointment:   6 month(s)  The format for your next appointment:   In Person  Provider:    You may see  Dr. Fletcher Anon  or one of the following Advanced Practice Providers on your designated Care Team:    Murray Hodgkins, NP  Christell Faith, PA-C  Marrianne Mood, PA-C   Other Instructions N/A

## 2019-11-10 LAB — BASIC METABOLIC PANEL
BUN/Creatinine Ratio: 24 (ref 12–28)
BUN: 20 mg/dL (ref 8–27)
CO2: 20 mmol/L (ref 20–29)
Calcium: 9.2 mg/dL (ref 8.7–10.3)
Chloride: 105 mmol/L (ref 96–106)
Creatinine, Ser: 0.83 mg/dL (ref 0.57–1.00)
GFR calc Af Amer: 79 mL/min/{1.73_m2} (ref >59)
GFR calc non Af Amer: 68 mL/min/{1.73_m2} (ref >59)
Glucose: 118 mg/dL — ABNORMAL HIGH (ref 65–99)
Potassium: 4.5 mmol/L (ref 3.5–5.2)
Sodium: 141 mmol/L (ref 134–144)

## 2019-11-10 LAB — CBC WITH DIFFERENTIAL/PLATELET
Basophils Absolute: 0.1 10*3/uL (ref 0.0–0.2)
Basos: 1 %
EOS (ABSOLUTE): 0.2 10*3/uL (ref 0.0–0.4)
Eos: 3 %
Hematocrit: 41.6 % (ref 34.0–46.6)
Hemoglobin: 14.5 g/dL (ref 11.1–15.9)
Immature Grans (Abs): 0 10*3/uL (ref 0.0–0.1)
Immature Granulocytes: 0 %
Lymphocytes Absolute: 1.3 10*3/uL (ref 0.7–3.1)
Lymphs: 25 %
MCH: 33.3 pg — ABNORMAL HIGH (ref 26.6–33.0)
MCHC: 34.9 g/dL (ref 31.5–35.7)
MCV: 95 fL (ref 79–97)
Monocytes Absolute: 0.5 10*3/uL (ref 0.1–0.9)
Monocytes: 9 %
Neutrophils Absolute: 3.4 10*3/uL (ref 1.4–7.0)
Neutrophils: 62 %
Platelets: 162 10*3/uL (ref 150–450)
RBC: 4.36 x10E6/uL (ref 3.77–5.28)
RDW: 12.4 % (ref 11.7–15.4)
WBC: 5.4 10*3/uL (ref 3.4–10.8)

## 2019-11-10 LAB — DIGOXIN LEVEL: Digoxin, Serum: 0.7 ng/mL (ref 0.5–0.9)

## 2019-11-16 ENCOUNTER — Telehealth: Payer: Self-pay

## 2019-11-16 NOTE — Telephone Encounter (Signed)
-----   Message from Wellington Hampshire, MD sent at 11/14/2019  3:52 PM EST ----- Inform patient that labs were normal.

## 2019-11-16 NOTE — Telephone Encounter (Signed)
Lab results have not been reviewed by the patient in mychart. DPR on file wit the ok to leave a detailed message on the patients voiced mail. lmom with the results.

## 2019-11-18 ENCOUNTER — Other Ambulatory Visit: Payer: Self-pay | Admitting: Cardiovascular Disease

## 2019-11-19 NOTE — Telephone Encounter (Signed)
Refill Request.  

## 2019-11-19 NOTE — Telephone Encounter (Signed)
Pt's age 78, wt 86.8 kg, SCr 0.83, CrCl 77.78, last ov w/ MA 11/09/19.

## 2019-11-22 ENCOUNTER — Other Ambulatory Visit: Payer: Self-pay | Admitting: Physician Assistant

## 2019-11-22 DIAGNOSIS — G2581 Restless legs syndrome: Secondary | ICD-10-CM

## 2019-11-22 DIAGNOSIS — G629 Polyneuropathy, unspecified: Secondary | ICD-10-CM

## 2019-12-13 ENCOUNTER — Other Ambulatory Visit: Payer: Self-pay | Admitting: Cardiovascular Disease

## 2019-12-18 ENCOUNTER — Other Ambulatory Visit: Payer: Self-pay | Admitting: Cardiovascular Disease

## 2019-12-18 NOTE — Telephone Encounter (Signed)
Refill Request.  

## 2019-12-25 ENCOUNTER — Telehealth: Payer: Self-pay | Admitting: Cardiovascular Disease

## 2019-12-25 NOTE — Telephone Encounter (Signed)
What type of surgery? For cataract- no need to hold.

## 2019-12-25 NOTE — Telephone Encounter (Signed)
° °  [facility] Pre-operative Risk Assessment    Request for surgical clearance:  1. What type of surgery is being performed?  Eye surgery   2. When is this surgery scheduled? 12/28/19  3. What type of clearance is required (medical clearance vs. Pharmacy clearance to hold med vs. Both)? Pharmacy   4. Are there any medications that need to be held prior to surgery and how long? Eliquis please advise   5. Practice name and name of physician performing surgery?  Dr. Lorina Rabon   6. What is your office phone number [phone] ( day surgery)    7.   What is your office fax number [phone]  8.   Anesthesia type (None, local, MAC, general) ? MAC    Clarisse Gouge 12/25/2019, 4:22 PM  _________________________________________________________________   (provider comments below)

## 2019-12-25 NOTE — Telephone Encounter (Signed)
   Primary Cardiologist: No primary care provider on file.  Chart reviewed as part of pre-operative protocol coverage. Given past medical history and time since last visit, based on ACC/AHA guidelines, Teresa Harris would be at acceptable risk for the planned procedure without further cardiovascular testing.   Concerning the Eliquis, since she is having her eyelids lifted with incision, would hold the Eliquis 12/25/2019 evening until after eyelid surgery on 12/28/2019. Take that evening and being usual dosing after that. She is aware that she will have more than usual bruising post operatively.   I will route this recommendation to the requesting party via Epic fax function and remove from pre-op pool.  Please call with questions.  Phill Myron. West Pugh, ANP, AACC  12/25/2019, 4:53 PM

## 2019-12-26 DIAGNOSIS — Z01812 Encounter for preprocedural laboratory examination: Secondary | ICD-10-CM | POA: Diagnosis not present

## 2019-12-26 DIAGNOSIS — Z20822 Contact with and (suspected) exposure to covid-19: Secondary | ICD-10-CM | POA: Diagnosis not present

## 2019-12-28 ENCOUNTER — Other Ambulatory Visit: Payer: Self-pay

## 2019-12-28 ENCOUNTER — Encounter: Payer: Self-pay | Admitting: Physician Assistant

## 2019-12-28 ENCOUNTER — Ambulatory Visit (INDEPENDENT_AMBULATORY_CARE_PROVIDER_SITE_OTHER): Payer: PPO | Admitting: Physician Assistant

## 2019-12-28 DIAGNOSIS — T3695XA Adverse effect of unspecified systemic antibiotic, initial encounter: Secondary | ICD-10-CM | POA: Diagnosis not present

## 2019-12-28 DIAGNOSIS — B379 Candidiasis, unspecified: Secondary | ICD-10-CM | POA: Diagnosis not present

## 2019-12-28 DIAGNOSIS — J0140 Acute pansinusitis, unspecified: Secondary | ICD-10-CM | POA: Diagnosis not present

## 2019-12-28 MED ORDER — FLUCONAZOLE 150 MG PO TABS: 150.0000 mg | ORAL_TABLET | Freq: Once | ORAL | 0 refills | Status: AC

## 2019-12-28 MED ORDER — AMOXICILLIN-POT CLAVULANATE 875-125 MG PO TABS: 1.0000 | ORAL_TABLET | Freq: Two times a day (BID) | ORAL | 0 refills | Status: DC

## 2019-12-28 NOTE — Progress Notes (Signed)
Patient: Teresa Harris Female    DOB: 04/16/1941   79 y.o.   MRN: [id number] Visit Date: 12/28/2019  Today's Provider: [name], PA-C   No chief complaint on file.  Subjective:    Virtual Visit via Telephone Note  I connected with Teresa Harris on 12/28/19 at 11:20 AM EST by telephone and verified that I am speaking with the correct person using two identifiers.  Location: Patient: home Provider: [facility]   I discussed the limitations, risks, security and privacy concerns of performing an evaluation and management service by telephone and the availability of in person appointments. I also discussed with the patient that there may be a patient responsible charge related to this service. The patient expressed understanding and agreed to proceed.   Sinusitis This is a recurrent problem. The current episode started in the past 7 days. The problem has been gradually worsening since onset. The maximum temperature recorded prior to her arrival was 100.4 - 100.9 F. The fever has been present for less than 1 day. Her pain is at a severity of 3/10. The pain is mild. Associated symptoms include congestion, coughing, headaches and sinus pressure. Pertinent negatives include no chills, diaphoresis, ear pain, shortness of breath, sneezing or sore throat. Past treatments include oral decongestants, saline sprays and acetaminophen. The treatment provided no relief.    Allergies  Allergen Reactions  . [name] [Moxifloxacin Hcl In Nacl] Other (See Comments)    Headache  . Bimatoprost Other (See Comments)    Made my eyes red, and hurt Made my eyes red, and hurt Made my eyes red, and hurt Made my eyes red, and hurt  . Brimonidine Other (See Comments)    Made my eyes red, and hurt Made my eyes red, and hurt Burned eyes.  . Mobic [Meloxicam] Other (See Comments)    headache  . Propofol Other (See Comments)    Had difficulty waking up Had difficulty waking up Difficult to  wake up.  . Travoprost Other (See Comments)    Unknown  . Vicodin [Hydrocodone-Acetaminophen] Nausea And Vomiting and Other (See Comments)    Sick on stomach  . Cephalexin Other (See Comments) and Nausea And Vomiting    headache headache headache     Current Outpatient Medications:  .  acetaminophen (TYLENOL) 500 MG tablet, Take 500-1,000 mg by mouth every 6 (six) hours as needed for moderate pain or headache., Disp: , Rfl:  .  Calcium Carbonate-Vitamin D (CALCIUM-VITAMIN D3 PO), Take 1 tablet by mouth 2 (two) times daily., Disp: , Rfl:  .  celecoxib (CELEBREX) 200 MG capsule, Take 200 mg by mouth daily., Disp: , Rfl:  .  cholecalciferol (VITAMIN D) 400 units TABS tablet, Take 400 Units by mouth 2 (two) times daily. , Disp: , Rfl:  .  cycloSPORINE (RESTASIS) 0.05 % ophthalmic emulsion, Place 1 drop into both eyes 2 (two) times daily. , Disp: , Rfl:  .  digoxin (LANOXIN) 0.125 MG tablet, TAKE 1 TABLET BY MOUTH ONCE EVERY MORNING, Disp: 30 tablet, Rfl: 2 .  dorzolamide-timolol (COSOPT) 22.3-6.8 MG/ML ophthalmic solution, Place 1 drop into both eyes 2 (two) times daily. , Disp: , Rfl:  .  ELIQUIS 5 MG TABS tablet, TAKE 1 TABLET BY MOUTH TWICE DAILY, Disp: 180 tablet, Rfl: 1 .  furosemide (LASIX) 20 MG tablet, TAKE 1 TABLET BY MOUTH ONCE DAILY, Disp: 90 tablet, Rfl: 0 .  latanoprost (XALATAN) 0.005 % ophthalmic solution, Place 1 drop into  both eyes daily. , Disp: , Rfl:  .  LYRICA 100 MG capsule, TAKE 1 CAPSULE BY MOUTH ONCE DAILY, Disp: 90 capsule, Rfl: 1 .  Magnesium 400 MG CAPS, Take 400 mg by mouth daily. , Disp: , Rfl:  .  metoprolol succinate (TOPROL-XL) 25 MG 24 hr tablet, Take 0.5 tablets (12.5 mg total) by mouth daily., Disp: 90 tablet, Rfl: 1 .  METRONIDAZOLE, TOPICAL, 0.75 % LOTN, Apply 1 application topically at bedtime. , Disp: , Rfl:  .  Multiple Vitamin (MULTIVITAMIN) tablet, Take 1 tablet by mouth daily., Disp: , Rfl:  .  omeprazole (PRILOSEC) 40 MG capsule, Take 40 mg by  mouth every morning. , Disp: , Rfl:  .  PREMARIN 0.625 MG tablet, TAKE 1 TABLET BY MOUTH ONCE DAILY, Disp: 90 tablet, Rfl: 3 .  SOOLANTRA 1 % CREA, Apply 1 application topically daily. Sometimes uses twice daily as needed for flare ups., Disp: , Rfl:   Review of Systems  Constitutional: Positive for fatigue and fever (Temp yesterday of 100.0). Negative for activity change, appetite change, chills, diaphoresis and unexpected weight change.  HENT: Positive for congestion, postnasal drip, rhinorrhea, sinus pressure and sinus pain. Negative for ear discharge, ear pain, hearing loss, nosebleeds, sneezing, sore throat and trouble swallowing.   Respiratory: Positive for cough and chest tightness. Negative for apnea, choking, shortness of breath, wheezing and stridor.   Gastrointestinal: Negative.   Neurological: Positive for headaches. Negative for dizziness and light-headedness.    Social History   Tobacco Use  . Smoking status: Former Smoker    Packs/day: 1.00    Years: 30.00    Pack years: 30.00    Types: Cigarettes    Quit date: 02/09/1996    Years since quitting: 23.8  . Smokeless tobacco: Never Used  Substance Use Topics  . Alcohol use: Yes    Comment: maybe twice a year- 1 mixed drink      Objective:   There were no vitals taken for this visit. There were no vitals filed for this visit.There is no height or weight on file to calculate BMI.   Physical Exam Vitals reviewed.  Neurological:     Mental Status: She is alert.      No results found for any visits on 12/28/19.     Assessment & Plan    1. Acute non-recurrent pansinusitis Worsening symptoms that have not responded to OTC medications. Will give augmentin as below. Continue allergy medications. Stay well hydrated and get plenty of rest. Call if no symptom improvement or if symptoms worsen. - amoxicillin-clavulanate (AUGMENTIN) 875-125 MG tablet; Take 1 tablet by mouth 2 (two) times daily.  Dispense: 20 tablet;  Refill: 0  2. Antibiotic-induced yeast infection Gets yeast infections with antibiotics. Diflucan given as below.  - fluconazole (DIFLUCAN) 150 MG tablet; Take 1 tablet (150 mg total) by mouth once for 1 dose. May repeat in 48-72 hrs if needed  Dispense: 2 tablet; Refill: 0    I discussed the assessment and treatment plan with the patient. The patient was provided an opportunity to ask questions and all were answered. The patient agreed with the plan and demonstrated an understanding of the instructions.   The patient was advised to call back or seek an in-person evaluation if the symptoms worsen or if the condition fails to improve as anticipated.  I provided 10 minutes of non-face-to-face time during this encounter.    [name], PA-C  [facility]

## 2019-12-28 NOTE — Patient Instructions (Signed)
Sinusitis, Adult Sinusitis is inflammation of your sinuses. Sinuses are hollow spaces in the bones around your face. Your sinuses are located:  Around your eyes.  In the middle of your forehead.  Behind your nose.  In your cheekbones. Mucus normally drains out of your sinuses. When your nasal tissues become inflamed or swollen, mucus can become trapped or blocked. This allows bacteria, viruses, and fungi to grow, which leads to infection. Most infections of the sinuses are caused by a virus. Sinusitis can develop quickly. It can last for up to 4 weeks (acute) or for more than 12 weeks (chronic). Sinusitis often develops after a cold. What are the causes? This condition is caused by anything that creates swelling in the sinuses or stops mucus from draining. This includes:  Allergies.  Asthma.  Infection from bacteria or viruses.  Deformities or blockages in your nose or sinuses.  Abnormal growths in the nose (nasal polyps).  Pollutants, such as chemicals or irritants in the air.  Infection from fungi (rare). What increases the risk? You are more likely to develop this condition if you:  Have a weak body defense system (immune system).  Do a lot of swimming or diving.  Overuse nasal sprays.  Smoke. What are the signs or symptoms? The main symptoms of this condition are pain and a feeling of pressure around the affected sinuses. Other symptoms include:  Stuffy nose or congestion.  Thick drainage from your nose.  Swelling and warmth over the affected sinuses.  Headache.  Upper toothache.  A cough that may get worse at night.  Extra mucus that collects in the throat or the back of the nose (postnasal drip).  Decreased sense of smell and taste.  Fatigue.  A fever.  Sore throat.  Bad breath. How is this diagnosed? This condition is diagnosed based on:  Your symptoms.  Your medical history.  A physical exam.  Tests to find out if your condition is  acute or chronic. This may include: ? Checking your nose for nasal polyps. ? Viewing your sinuses using a device that has a light (endoscope). ? Testing for allergies or bacteria. ? Imaging tests, such as an MRI or CT scan. In rare cases, a bone biopsy may be done to rule out more serious types of fungal sinus disease. How is this treated? Treatment for sinusitis depends on the cause and whether your condition is chronic or acute.  If caused by a virus, your symptoms should go away on their own within 10 days. You may be given medicines to relieve symptoms. They include: ? Medicines that shrink swollen nasal passages (topical intranasal decongestants). ? Medicines that treat allergies (antihistamines). ? A spray that eases inflammation of the nostrils (topical intranasal corticosteroids). ? Rinses that help get rid of thick mucus in your nose (nasal saline washes).  If caused by bacteria, your health care provider may recommend waiting to see if your symptoms improve. Most bacterial infections will get better without antibiotic medicine. You may be given antibiotics if you have: ? A severe infection. ? A weak immune system.  If caused by narrow nasal passages or nasal polyps, you may need to have surgery. Follow these instructions at home: Medicines  Take, use, or apply over-the-counter and prescription medicines only as told by your health care provider. These may include nasal sprays.  If you were prescribed an antibiotic medicine, take it as told by your health care provider. Do not stop taking the antibiotic even if you start   to feel better. Hydrate and humidify   Drink enough fluid to keep your urine pale yellow. Staying hydrated will help to thin your mucus.  Use a cool mist humidifier to keep the humidity level in your home above 50%.  Inhale steam for 10-15 minutes, 3-4 times a day, or as told by your health care provider. You can do this in the bathroom while a hot shower is  running.  Limit your exposure to cool or dry air. Rest  Rest as much as possible.  Sleep with your head raised (elevated).  Make sure you get enough sleep each night. General instructions   Apply a warm, moist washcloth to your face 3-4 times a day or as told by your health care provider. This will help with discomfort.  Wash your hands often with soap and water to reduce your exposure to germs. If soap and water are not available, use hand sanitizer.  Do not smoke. Avoid being around people who are smoking (secondhand smoke).  Keep all follow-up visits as told by your health care provider. This is important. Contact a health care provider if:  You have a fever.  Your symptoms get worse.  Your symptoms do not improve within 10 days. Get help right away if:  You have a severe headache.  You have persistent vomiting.  You have severe pain or swelling around your face or eyes.  You have vision problems.  You develop confusion.  Your neck is stiff.  You have trouble breathing. Summary  Sinusitis is soreness and inflammation of your sinuses. Sinuses are hollow spaces in the bones around your face.  This condition is caused by nasal tissues that become inflamed or swollen. The swelling traps or blocks the flow of mucus. This allows bacteria, viruses, and fungi to grow, which leads to infection.  If you were prescribed an antibiotic medicine, take it as told by your health care provider. Do not stop taking the antibiotic even if you start to feel better.  Keep all follow-up visits as told by your health care provider. This is important. This information is not intended to replace advice given to you by your health care provider. Make sure you discuss any questions you have with your health care provider. Document Revised: 04/24/2018 Document Reviewed: 04/24/2018 Elsevier Patient Education  2020 [employer]  

## 2020-01-12 ENCOUNTER — Other Ambulatory Visit: Payer: Self-pay | Admitting: Physician Assistant

## 2020-01-12 DIAGNOSIS — I482 Chronic atrial fibrillation, unspecified: Secondary | ICD-10-CM

## 2020-01-14 ENCOUNTER — Other Ambulatory Visit: Payer: Self-pay | Admitting: Physician Assistant

## 2020-01-14 DIAGNOSIS — I482 Chronic atrial fibrillation, unspecified: Secondary | ICD-10-CM

## 2020-01-14 NOTE — Progress Notes (Signed)
Patient: Teresa Harris Female    DOB: Jan 07, 1941   79 y.o.   MRN: [id number] Visit Date: 01/17/2020  Today's Provider: [name], PA-C   No chief complaint on file.  Subjective:  Virtual Visit via Telephone Note  I connected with Teresa Harris on 01/17/20 at  3:40 PM EST by telephone and verified that I am speaking with the correct person using two identifiers.  Location: Patient: Home Provider: [facility]   I discussed the limitations, risks, security and privacy concerns of performing an evaluation and management service by telephone and the availability of in person appointments. I also discussed with the patient that there may be a patient responsible charge related to this service. The patient expressed understanding and agreed to proceed.    HPI  Patient want to talk to provider about the side effects that she had with the Covid-19 vaccine that she received January 02, 2020. She reports that she had like Flu like symptoms or covid symptoms and was not able to get out of bed for almost 2-3 weeks. She reports today is the first day she has started to feel normal. She reports she had no appetite and would get nauseated with eating. Became dehydrated and was weak. Did have family members that have been exposed to covid and were isolating. They did bring her food over and would leave on the porch. Does not think she was exposed. However, of note she had been having URI symptoms prior to her covid vaccine on 01/02/20. She had been seen by me via telephone visit for possible sinus infection and was started on Augmentin on 12/28/19. She had been tested for Covid 19 on 12/26/19 prior to a procedure and was negative.      Allergies  Allergen Reactions  . [name] [Moxifloxacin Hcl In Nacl] Other (See Comments)    Headache  . Bimatoprost Other (See Comments)    Made my eyes red, and hurt Made my eyes red, and hurt Made my eyes red, and hurt Made my eyes red, and hurt  .  Brimonidine Other (See Comments)    Made my eyes red, and hurt Made my eyes red, and hurt Burned eyes.  . Mobic [Meloxicam] Other (See Comments)    headache  . Propofol Other (See Comments)    Had difficulty waking up Had difficulty waking up Difficult to wake up.  . Travoprost Other (See Comments)    Unknown  . Vicodin [Hydrocodone-Acetaminophen] Nausea And Vomiting and Other (See Comments)    Sick on stomach  . Cephalexin Other (See Comments) and Nausea And Vomiting    headache headache headache     Current Outpatient Medications:  .  Calcium Carbonate-Vitamin D (CALCIUM-VITAMIN D3 PO), Take 1 tablet by mouth 2 (two) times daily., Disp: , Rfl:  .  celecoxib (CELEBREX) 200 MG capsule, Take 200 mg by mouth daily., Disp: , Rfl:  .  cholecalciferol (VITAMIN D) 400 units TABS tablet, Take 400 Units by mouth 2 (two) times daily. , Disp: , Rfl:  .  cycloSPORINE (RESTASIS) 0.05 % ophthalmic emulsion, Place 1 drop into both eyes 2 (two) times daily. , Disp: , Rfl:  .  digoxin (LANOXIN) 0.125 MG tablet, TAKE 1 TABLET BY MOUTH ONCE EVERY MORNING, Disp: 30 tablet, Rfl: 2 .  dorzolamide-timolol (COSOPT) 22.3-6.8 MG/ML ophthalmic solution, Place 1 drop into both eyes 2 (two) times daily. , Disp: , Rfl:  .  ELIQUIS 5 MG TABS tablet, TAKE  1 TABLET BY MOUTH TWICE DAILY, Disp: 180 tablet, Rfl: 1 .  furosemide (LASIX) 20 MG tablet, TAKE 1 TABLET BY MOUTH ONCE DAILY, Disp: 90 tablet, Rfl: 0 .  latanoprost (XALATAN) 0.005 % ophthalmic solution, Place 1 drop into both eyes daily. , Disp: , Rfl:  .  LYRICA 100 MG capsule, TAKE 1 CAPSULE BY MOUTH ONCE DAILY, Disp: 90 capsule, Rfl: 1 .  Magnesium 400 MG CAPS, Take 400 mg by mouth daily. , Disp: , Rfl:  .  metoprolol succinate (TOPROL-XL) 25 MG 24 hr tablet, TAKE 1/2 TABLET BY MOUTH ONCE DAILY, Disp: 90 tablet, Rfl: 0 .  Multiple Vitamin (MULTIVITAMIN) tablet, Take 1 tablet by mouth daily., Disp: , Rfl:  .  Omega-3 Fatty Acids (FISH OIL) 1200 MG CAPS,  Take by mouth., Disp: , Rfl:  .  omeprazole (PRILOSEC) 40 MG capsule, Take 40 mg by mouth every morning. , Disp: , Rfl:  .  PREMARIN 0.625 MG tablet, TAKE 1 TABLET BY MOUTH ONCE DAILY, Disp: 90 tablet, Rfl: 3 .  SOOLANTRA 1 % CREA, Apply 1 application topically daily. Sometimes uses twice daily as needed for flare ups., Disp: , Rfl:   Review of Systems  Constitutional: Positive for appetite change, chills, fatigue and fever.  HENT: Positive for congestion and sinus pain. Negative for sneezing, sore throat and trouble swallowing.   Respiratory: Negative for cough, chest tightness and shortness of breath.   Cardiovascular: Negative for chest pain, palpitations and leg swelling.  Gastrointestinal: Positive for abdominal pain, nausea and vomiting.  Neurological: Positive for weakness and headaches.    Social History   Tobacco Use  . Smoking status: Former Smoker    Packs/day: 1.00    Years: 30.00    Pack years: 30.00    Types: Cigarettes    Quit date: 02/09/1996    Years since quitting: 23.9  . Smokeless tobacco: Never Used  Substance Use Topics  . Alcohol use: Yes    Comment: maybe twice a year- 1 mixed drink      Objective:   There were no vitals taken for this visit. There were no vitals filed for this visit.There is no height or weight on file to calculate BMI.   Physical Exam Vitals reviewed.  Constitutional:      General: She is not in acute distress. Pulmonary:     Effort: No respiratory distress.  Neurological:     Mental Status: She is alert.      No results found for any visits on 01/17/20.     Assessment & Plan     1. Adverse effect of vaccine, initial encounter Possibly adverse reaction to covid 19 vaccine (Pfizer) vs flu or covid infection. Unfortunately patient did not call during symptoms, nor was tested for either. She is feeling better now and wanted documentation. Continue symptomatic relief and pushing fluids from being dehydrated. She is due for her  second covid vaccine next week. Call if symptoms recur or if something else develops. I did advise for her to report her symptoms to the Vsafe CDC vaccine side effect page. I do not know if this would classify severe enough or actual adverse event to be reported to VAERS website, thus I have not reported it.    I discussed the assessment and treatment plan with the patient. The patient was provided an opportunity to ask questions and all were answered. The patient agreed with the plan and demonstrated an understanding of the instructions.   The patient was  advised to call back or seek an in-person evaluation if the symptoms worsen or if the condition fails to improve as anticipated.  I provided 14 minutes of non-face-to-face time during this encounter.    [name], PA-C  [facility]

## 2020-01-17 ENCOUNTER — Ambulatory Visit (INDEPENDENT_AMBULATORY_CARE_PROVIDER_SITE_OTHER): Payer: PPO | Admitting: Physician Assistant

## 2020-01-17 DIAGNOSIS — T50Z95A Adverse effect of other vaccines and biological substances, initial encounter: Secondary | ICD-10-CM | POA: Diagnosis not present

## 2020-01-18 ENCOUNTER — Encounter: Payer: Self-pay | Admitting: Physician Assistant

## 2020-01-31 DIAGNOSIS — Z03818 Encounter for observation for suspected exposure to other biological agents ruled out: Secondary | ICD-10-CM | POA: Diagnosis not present

## 2020-01-31 DIAGNOSIS — Z20822 Contact with and (suspected) exposure to covid-19: Secondary | ICD-10-CM | POA: Diagnosis not present

## 2020-02-01 DIAGNOSIS — R0982 Postnasal drip: Secondary | ICD-10-CM | POA: Diagnosis not present

## 2020-02-01 DIAGNOSIS — J329 Chronic sinusitis, unspecified: Secondary | ICD-10-CM | POA: Diagnosis not present

## 2020-02-02 ENCOUNTER — Other Ambulatory Visit: Payer: Self-pay | Admitting: Cardiovascular Disease

## 2020-02-11 DIAGNOSIS — H401123 Primary open-angle glaucoma, left eye, severe stage: Secondary | ICD-10-CM | POA: Diagnosis not present

## 2020-02-13 DIAGNOSIS — H401123 Primary open-angle glaucoma, left eye, severe stage: Secondary | ICD-10-CM | POA: Diagnosis not present

## 2020-02-19 DIAGNOSIS — B379 Candidiasis, unspecified: Secondary | ICD-10-CM | POA: Diagnosis not present

## 2020-02-19 DIAGNOSIS — J329 Chronic sinusitis, unspecified: Secondary | ICD-10-CM | POA: Diagnosis not present

## 2020-02-22 IMAGING — MG MM  DIGITAL SCREENING BREAST BILAT IMPLANT W/ TOMO W/ CAD
8 of 15 series · 8 of 31 positions shown · non-contrast
Comparison: Previous exam(s).

CLINICAL DATA: Screening.

EXAM:
DIGITAL SCREENING BILATERAL MAMMOGRAM WITH IMPLANTS, CAD AND TOMO
The patient has retropectoral implants. Standard and implant
displaced views were performed.

[L CC]
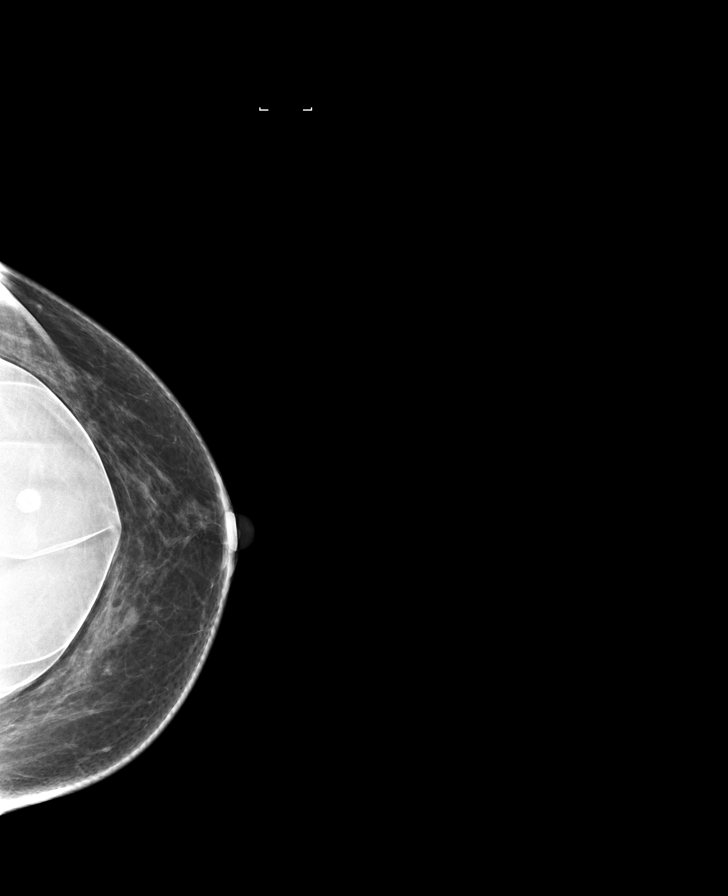

[R CC (1 of 2)]
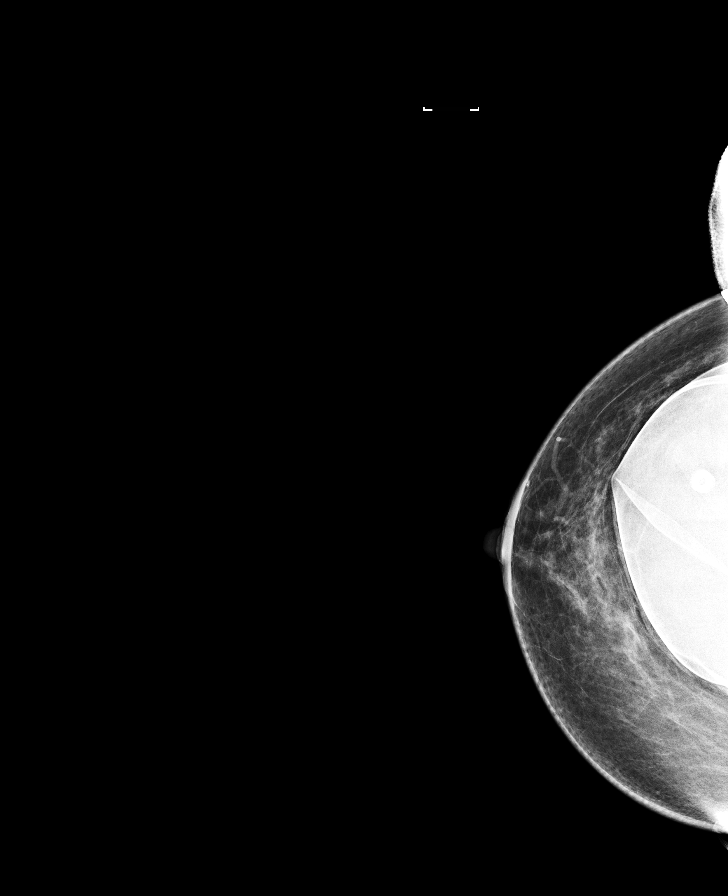

[L MLO (1 of 2)]
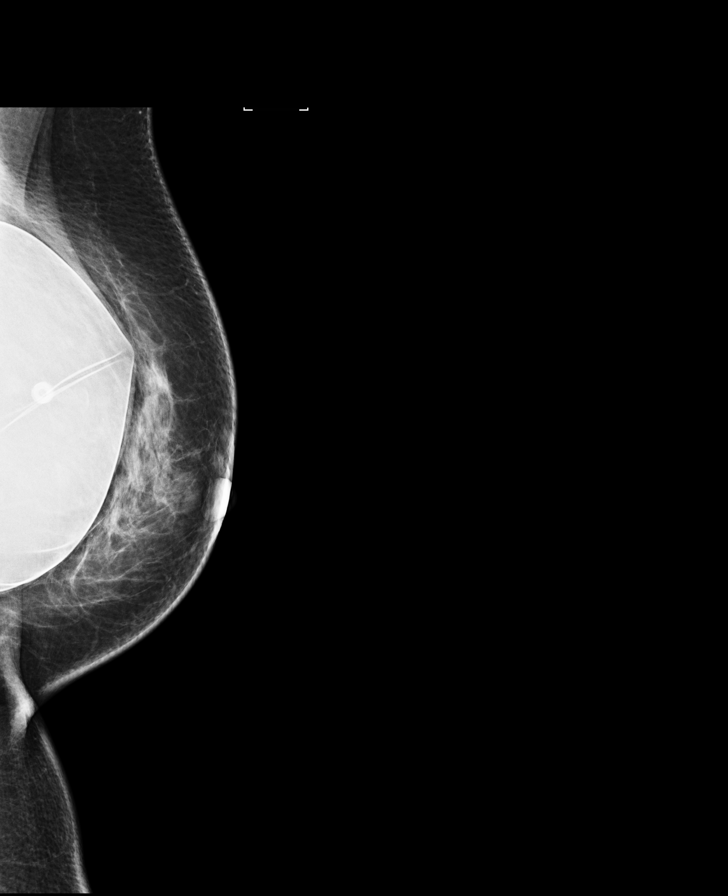

[R MLO]
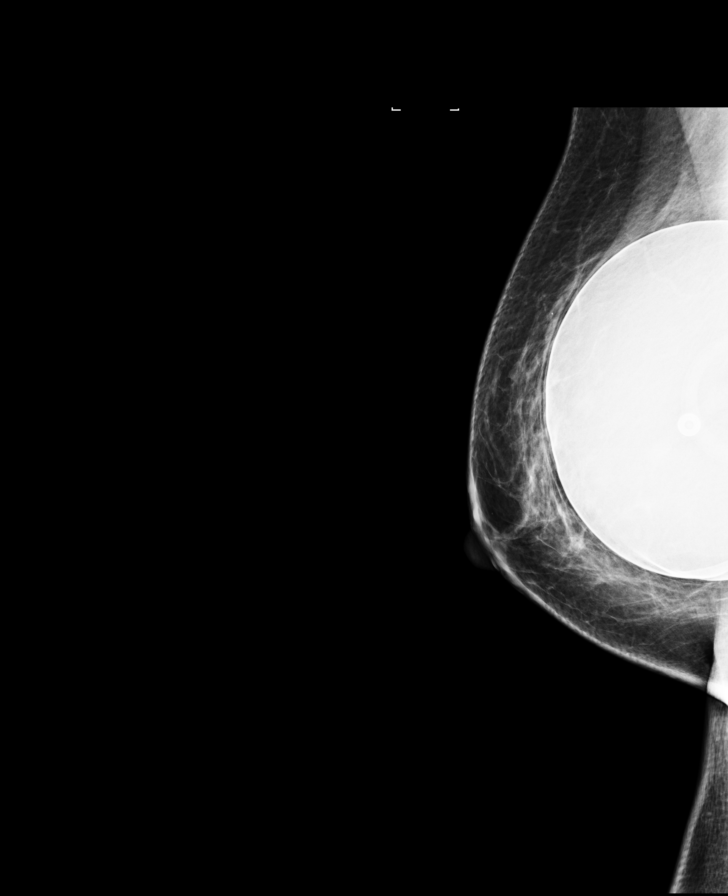

[R CC synth-2D]
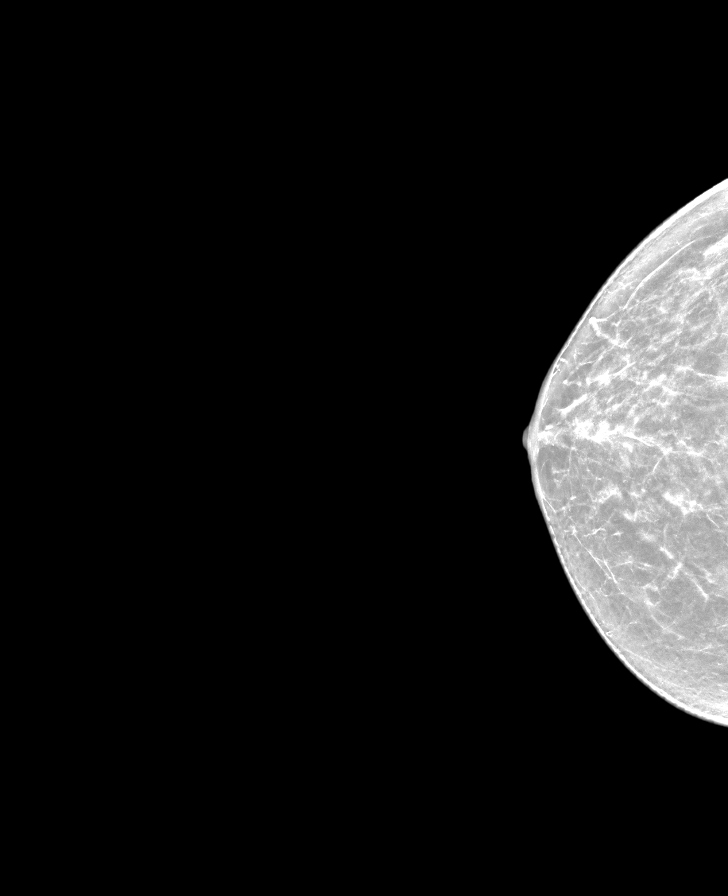

[L MLO synth-2D]
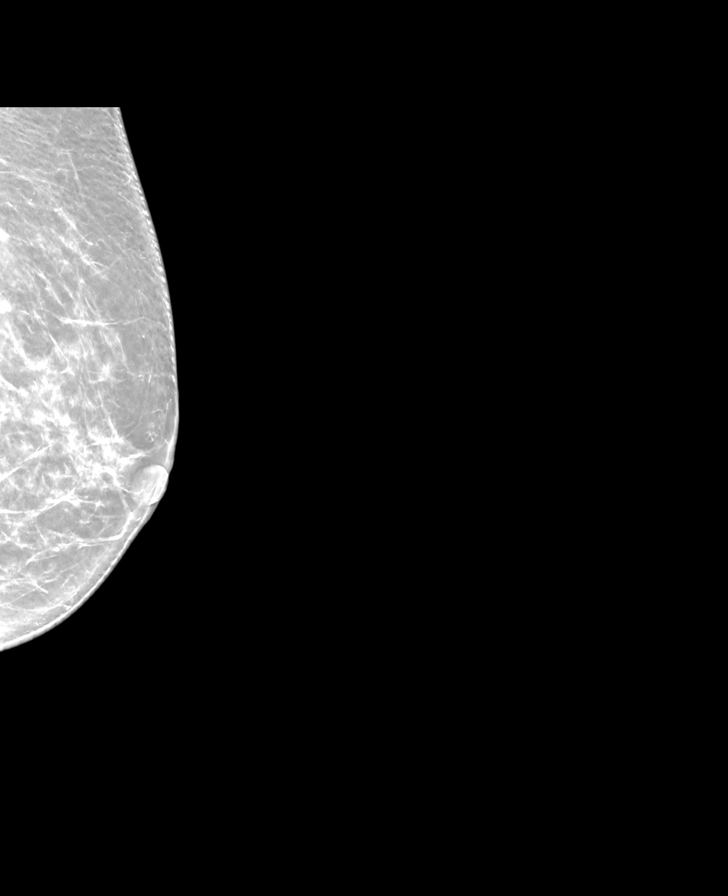

[R CC (2 of 2)]
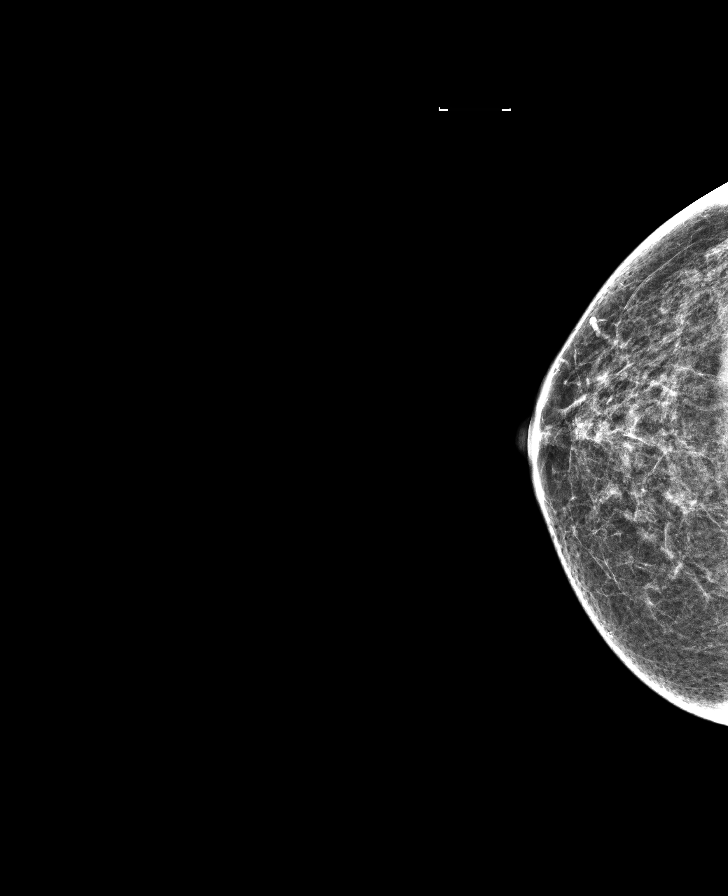

[L MLO (2 of 2)]
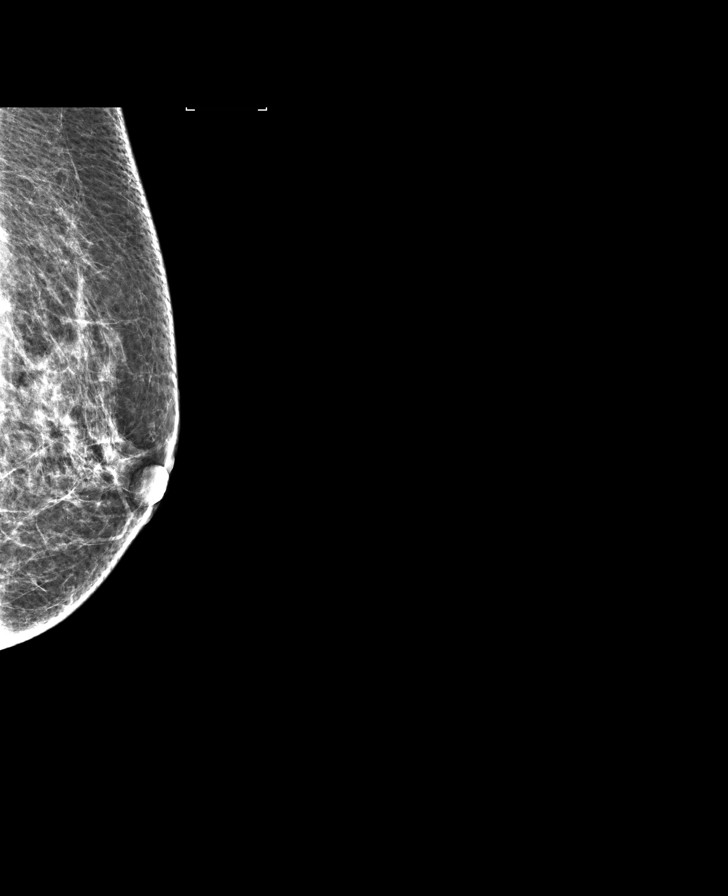

[8 of 31 positions shown; findings below may reference images not displayed]

ACR Breast Density Category b: There are scattered areas of
fibroglandular density.
FINDINGS: Bilateral retropectoral saline implants are intact.

There are no findings suspicious for malignancy. Images were
processed with CAD.
IMPRESSION: No mammographic evidence of malignancy. A result letter of this
screening mammogram will be mailed directly to the patient.

RECOMMENDATION:
Screening mammogram in one year. (Code:US-Q-7GQ)

BI-RADS CATEGORY  1:  Negative.

## 2020-03-06 ENCOUNTER — Telehealth: Payer: Self-pay | Admitting: Physician Assistant

## 2020-03-06 NOTE — Telephone Encounter (Signed)
Left message for patient to schedule Annual Wellness Visit.  Please schedule with Nurse Health Advisor Victoria Britt, RN at [facility] [place] [facility]  

## 2020-03-13 NOTE — Progress Notes (Signed)
Subjective:   Teresa DEJOSEPH is a 79 y.o. female who presents for Medicare Annual (Subsequent) preventive examination.    This visit is being conducted through telemedicine due to the COVID-19 pandemic. This patient has given me verbal consent via doximity to conduct this visit, patient states they are participating from their home address. Some vital signs may be absent or patient reported.    Patient identification: identified by name, DOB, and current address  Review of Systems:  N/A  Cardiac Risk Factors include: advanced age (>38men, >65 women);hypertension     Objective:     Vitals: There were no vitals taken for this visit.  There is no height or weight on file to calculate BMI. Unable to obtain vitals due to visit being conducted via telephonically.   Advanced Directives 03/17/2020 01/26/2019 03/13/2018 03/09/2018 12/28/2017 10/05/2017 12/21/2016  Does Patient Have a Medical Advance Directive? Yes Yes Yes Yes Yes Yes Yes  Type of Paramedic of [place];Living will Living will;Healthcare Power of [place] - -  Does patient want to make changes to medical advance directive? - - No - Patient declined No - Patient declined - - -  Copy of [place] in Chart? No - copy requested No - copy requested No - copy requested - - - -    Tobacco Social History   Tobacco Use  Smoking Status Former Smoker  . Packs/day: 1.00  . Years: 30.00  . Pack years: 30.00  . Types: Cigarettes  . Quit date: 02/09/1996  . Years since quitting: 24.1  Smokeless Tobacco Never Used     Counseling given: Not Answered   Clinical Intake:  Pre-visit preparation completed: Yes  Pain : No/denies pain Pain Score: 0-No pain     Nutritional Risks: None Diabetes: No  How often do you need to have someone help you when you read instructions, pamphlets, or other written materials from your doctor or  pharmacy?: 1 - Never  Interpreter Needed?: No  Information entered by :: [facility], LPN  Past Medical History:  Diagnosis Date  . Arthritis   . Atrial fibrillation ([place]) 10/01/14  . Complication of anesthesia    trouble waking up with Propofol  . Depression    h/o  . Dysrhythmia    a fib  . Family history of adverse reaction to anesthesia    daughter PONV  . Glaucoma    left eye  . Headache    H/O MIGRAINES  . Hemorrhoids   . Hiatal hernia   . History of kidney stones   . History of migraine headaches   . Hx MRSA infection   . Hypercholesteremia   . Kidney calculi   . Osteopenia   . PONV (postoperative nausea and vomiting)    Past Surgical History:  Procedure Laterality Date  . ABDOMINAL HYSTERECTOMY    . AUGMENTATION MAMMAPLASTY Bilateral 2004  . CARPAL TUNNEL RELEASE Right 02/16/2016   Procedure: CARPAL TUNNEL RELEASE;  Surgeon: Dereck Leep, MD;  Location: [facility] ORS;  Service: Orthopedics;  Laterality: Right;  . CARPAL TUNNEL RELEASE Left 04/12/2016   Procedure: CARPAL TUNNEL RELEASE;  Surgeon: Dereck Leep, MD;  Location: [facility] ORS;  Service: Orthopedics;  Laterality: Left;  . CARPAL TUNNEL RELEASE Right 03/13/2018   Procedure: CARPAL TUNNEL RELEASE;  Surgeon: Deetta Perla, MD;  Location: [facility] ORS;  Service: Neurosurgery;  Laterality: Right;  . CATARACT EXTRACTION, BILATERAL    .  COLONOSCOPY  12/29/09   Dr Dionne Milo  . EYE SURGERY Left    shunt placed  . EYE SURGERY Left    tube placed  . FOOT SURGERY     bilateral   . JOINT REPLACEMENT    . NASAL SEPTUM SURGERY    . REPLACEMENT TOTAL KNEE     bilateral  . SHOULDER SURGERY     right shoulder   Family History  Problem Relation Age of Onset  . Arthritis Mother   . Arthritis Father   . Breast cancer Neg Hx    Social History   Socioeconomic History  . Marital status: Divorced    Spouse name: Not on file  . Number of children: 2  . Years of education: Not on file  . Highest education level: Some  college, no degree  Occupational History  . Occupation: retired  Tobacco Use  . Smoking status: Former Smoker    Packs/day: 1.00    Years: 30.00    Pack years: 30.00    Types: Cigarettes    Quit date: 02/09/1996    Years since quitting: 24.1  . Smokeless tobacco: Never Used  Substance and Sexual Activity  . Alcohol use: Yes    Comment: maybe twice a year- 1 mixed drink  . Drug use: No  . Sexual activity: Not on file  Other Topics Concern  . Not on file  Social History Narrative  . Not on file   Social Determinants of Health   Financial Resource Strain: Low Risk   . Difficulty of Paying Living Expenses: Not hard at all  Food Insecurity: No Food Insecurity  . Worried About Charity fundraiser in the Last Year: Never true  . Ran Out of Food in the Last Year: Never true  Transportation Needs: No Transportation Needs  . Lack of Transportation (Medical): No  . Lack of Transportation (Non-Medical): No  Physical Activity: Inactive  . Days of Exercise per Week: 0 days  . Minutes of Exercise per Session: 0 min  Stress: No Stress Concern Present  . Feeling of Stress : Only a little  Social Connections: Moderately Isolated  . Frequency of Communication with Friends and Family: More than three times a week  . Frequency of Social Gatherings with Friends and Family: More than three times a week  . Attends Religious Services: Never  . Active Member of Clubs or Organizations: No  . Attends Archivist Meetings: Never  . Marital Status: Divorced    Outpatient Encounter Medications as of 03/17/2020  Medication Sig  . Calcium Carbonate-Vitamin D (CALCIUM-VITAMIN D3 PO) Take 1 tablet by mouth daily. Calcium 600 = D3 1000  . celecoxib (CELEBREX) 200 MG capsule Take 200 mg by mouth daily.  . Cholecalciferol (VITAMIN D3) 50 MCG (2000 UT) capsule Take 2,000 Units by mouth daily.  . cycloSPORINE (RESTASIS) 0.05 % ophthalmic emulsion Place 1 drop into both eyes 2 (two) times daily.   .  digoxin (LANOXIN) 0.125 MG tablet TAKE 1 TABLET BY MOUTH ONCE EVERY MORNING (Patient taking differently: Take 0.0625 mg by mouth daily. )  . dorzolamide-timolol (COSOPT) 22.3-6.8 MG/ML ophthalmic solution Place 1 drop into both eyes 2 (two) times daily.   Marland Kitchen ELIQUIS 5 MG TABS tablet TAKE 1 TABLET BY MOUTH TWICE DAILY  . furosemide (LASIX) 20 MG tablet TAKE 1 TABLET BY MOUTH ONCE DAILY  . latanoprost (XALATAN) 0.005 % ophthalmic solution Place 1 drop into both eyes daily.   Marland Kitchen LYRICA 100 MG  capsule TAKE 1 CAPSULE BY MOUTH ONCE DAILY  . Magnesium 400 MG CAPS Take 400 mg by mouth daily.   . metoprolol succinate (TOPROL-XL) 25 MG 24 hr tablet TAKE 1/2 TABLET BY MOUTH ONCE DAILY  . METRONIDAZOLE, TOPICAL, 0.75 % LOTN Apply topically daily. Apply to face in AM  . Multiple Vitamin (MULTIVITAMIN) tablet Take 1 tablet by mouth daily.  . NON FORMULARY Place 12 sprays into both nostrils 6 (six) times daily. 250GU irrigant  . Omega-3 Fatty Acids (FISH OIL) 1200 MG CAPS Take by mouth daily.   Marland Kitchen omeprazole (PRILOSEC) 40 MG capsule Take 40 mg by mouth every morning.   Marland Kitchen PREMARIN 0.625 MG tablet TAKE 1 TABLET BY MOUTH ONCE DAILY  . SOOLANTRA 1 % CREA Apply 1 application topically daily. Sometimes uses twice daily as needed for flare ups.  . cholecalciferol (VITAMIN D) 400 units TABS tablet Take 400 Units by mouth 2 (two) times daily.   . [DISCONTINUED] furosemide (LASIX) 20 MG tablet TAKE 1 TABLET BY MOUTH ONCE DAILY   No facility-administered encounter medications on file as of 03/17/2020.    Activities of Daily Living In your present state of health, do you have any difficulty performing the following activities: 03/17/2020  Hearing? Y  Comment Does not wear hearing aids.  Vision? Y  Comment Due to glaucoma in both eyes.  Difficulty concentrating or making decisions? N  Walking or climbing stairs? Y  Comment Due to SOB.  Dressing or bathing? N  Doing errands, shopping? N  Preparing Food and eating ? N    Using the Toilet? N  In the past six months, have you accidently leaked urine? N  Do you have problems with loss of bowel control? N  Managing your Medications? N  Managing your Finances? N  Housekeeping or managing your Housekeeping? N  Some recent data might be hidden    Patient Care Team: [name], PA-C as PCP - General (Family Medicine) Wellington Hampshire, MD as Consulting Physician (Cardiology) Eulogio Bear, MD as Consulting Physician (Ophthalmology) Sharlet Salina, MD as Referring Physician (Physical Medicine and Rehabilitation) Beverly Gust, MD (Otolaryngology)    Assessment:   This is a routine wellness examination for Paulet.  Exercise Activities and Dietary recommendations Current Exercise Habits: The patient does not participate in regular exercise at present, Exercise limited by: Other - see comments(concerned about balance)  Goals    . DIET - INCREASE WATER INTAKE     Recommend increasing water intake to 6-8 glasses a day.     Marland Kitchen DIET - REDUCE CALORIE INTAKE     Recommend to continue current diet plan of cutting down on calories and carbohydrates to help aid in weight loss.        Fall Risk: Fall Risk  03/17/2020 07/05/2019 12/28/2017 12/21/2016 11/27/2015  Falls in the past year? 0 (No Data) No No No  Comment - Emmi Telephone Survey: data to providers prior to load - - -  Number falls in past yr: 0 (No Data) - - -  Comment - Emmi Telephone Survey Actual Response =  - - -  Injury with Fall? 0 - - - -    FALL RISK PREVENTION PERTAINING TO THE HOME:  Any stairs in or around the home? No  If so, are there any without handrails? N/A  Home free of loose throw rugs in walkways, pet beds, electrical cords, etc? Yes  Adequate lighting in your home to reduce risk of falls? Yes  ASSISTIVE DEVICES UTILIZED TO PREVENT FALLS:  Life alert? No  Use of a cane, walker or w/c? No  Grab bars in the bathroom? Yes  Shower chair or bench in shower?  No  Elevated toilet seat or a handicapped toilet? Yes    TIMED UP AND GO:  Was the test performed? No .    Depression Screen PHQ 2/9 Scores 03/17/2020 01/26/2019 12/28/2017 10/05/2017  PHQ - 2 Score 0 0 0 1  PHQ- 9 Score - 2 - -     Cognitive Function     6CIT Screen 03/17/2020  What Year? 0 points  What month? 0 points  What time? 0 points  Count back from 20 0 points  Months in reverse 0 points  Repeat phrase 0 points  Total Score 0    Immunization History  Administered Date(s) Administered  . Fluad Quad(high Dose 65+) 08/29/2019  . Influenza, High Dose Seasonal PF 08/26/2016, 09/07/2017, 08/17/2018  . Influenza,inj,Quad PF,6+ Mos 09/09/2014  . Influenza-Unspecified 10/08/2015  . Pneumococcal Conjugate-13 11/11/2014  . Pneumococcal Polysaccharide-23 10/27/2012  . Tdap 12/03/2010    Qualifies for Shingles Vaccine? Yes . Due for Shingrix. Pt has been advised to call insurance company to determine out of pocket expense. Advised may also receive vaccine at local pharmacy or Health Dept. Verbalized acceptance and understanding.  Tdap: Up to date  Flu Vaccine: Up to date  Pneumococcal Vaccine: Completed series  Screening Tests Health Maintenance  Topic Date Due  . INFLUENZA VACCINE  07/06/2020  . TETANUS/TDAP  12/03/2020  . DEXA SCAN  02/08/2022  . PNA vac Low Risk Adult  Completed    Cancer Screenings:  Colorectal Screening: No longer required.   Mammogram: No longer required.   Bone Density: Completed 08/11/17. Results reflect OSTEOPENIA. Repeat every 5 years.   Lung Cancer Screening: (Low Dose CT Chest recommended if Age 13-80 years, 30 pack-year currently smoking OR have quit w/in 15years.) does not qualify.   Additional Screening:  Vision Screening: Recommended annual ophthalmology exams for early detection of glaucoma and other disorders of the eye.  Dental Screening: Recommended annual dental exams for proper oral hygiene  Community Resource  Referral:  CRR required this visit?  No       Plan:  I have personally reviewed and addressed the Medicare Annual Wellness questionnaire and have noted the following in the patient's chart:  A. Medical and social history B. Use of alcohol, tobacco or illicit drugs  C. Current medications and supplements D. Functional ability and status E.  Nutritional status F.  Physical activity G. Advance directives H. List of other physicians I.  Hospitalizations, surgeries, and ER visits in previous 12 months J.  [place] such as hearing and vision if needed, cognitive and depression L. Referrals and appointments   In addition, I have reviewed and discussed with patient certain preventive protocols, quality metrics, and best practice recommendations. A written personalized care plan for preventive services as well as general preventive health recommendations were provided to patient. Nurse Health Advisor  Signed,    Lateefah Mallery [place], [place]  [id number] Nurse Health Advisor   Nurse Notes: None.

## 2020-03-14 ENCOUNTER — Other Ambulatory Visit: Payer: Self-pay | Admitting: Cardiovascular Disease

## 2020-03-17 ENCOUNTER — Ambulatory Visit (INDEPENDENT_AMBULATORY_CARE_PROVIDER_SITE_OTHER): Payer: PPO

## 2020-03-17 ENCOUNTER — Other Ambulatory Visit: Payer: Self-pay

## 2020-03-17 DIAGNOSIS — Z0000 Encounter for general adult medical examination without abnormal findings: Secondary | ICD-10-CM

## 2020-03-17 NOTE — Patient Instructions (Signed)
Teresa Harris , Thank you for taking time to come for your Medicare Wellness Visit. I appreciate your ongoing commitment to your health goals. Please review the following plan we discussed and let me know if I can assist you in the future.   Screening recommendations/referrals: Colonoscopy: No longer required.  Mammogram: No longer required.  Bone Density: Up to date, due 02/2022 Recommended yearly ophthalmology/optometry visit for glaucoma screening and checkup Recommended yearly dental visit for hygiene and checkup  Vaccinations: Influenza vaccine: Up to date Pneumococcal vaccine: Completed series Tdap vaccine: Up to date Shingles vaccine: Pt declines today.     Advanced directives: Please bring a copy of your POA (Power of Attorney) and/or Living Will to your next appointment.   Conditions/risks identified: Recommend to increase water intake to 6-8 8 oz glasses of water a day.   Next appointment: 03/31/20 @ 4:00 PM with Fenton Malling. Declined scheduling an AWV for 2022 at this time.    Preventive Care 39 Years and Older, Female Preventive care refers to lifestyle choices and visits with your health care provider that can promote health and wellness. What does preventive care include?  A yearly physical exam. This is also called an annual well check.  Dental exams once or twice a year.  Routine eye exams. Ask your health care provider how often you should have your eyes checked.  Personal lifestyle choices, including:  Daily care of your teeth and gums.  Regular physical activity.  Eating a healthy diet.  Avoiding tobacco and drug use.  Limiting alcohol use.  Practicing safe sex.  Taking low-dose aspirin every day.  Taking vitamin and mineral supplements as recommended by your health care provider. What happens during an annual well check? The services and screenings done by your health care provider during your annual well check will depend on your age, overall  health, lifestyle risk factors, and family history of disease. Counseling  Your health care provider may ask you questions about your:  Alcohol use.  Tobacco use.  Drug use.  Emotional well-being.  Home and relationship well-being.  Sexual activity.  Eating habits.  History of falls.  Memory and ability to understand (cognition).  Work and work Statistician.  Reproductive health. Screening  You may have the following tests or measurements:  Height, weight, and BMI.  Blood pressure.  Lipid and cholesterol levels. These may be checked every 5 years, or more frequently if you are over 28 years old.  Skin check.  Lung cancer screening. You may have this screening every year starting at age 64 if you have a 30-pack-year history of smoking and currently smoke or have quit within the past 15 years.  Fecal occult blood test (FOBT) of the stool. You may have this test every year starting at age 20.  Flexible sigmoidoscopy or colonoscopy. You may have a sigmoidoscopy every 5 years or a colonoscopy every 10 years starting at age 37.  Hepatitis C blood test.  Hepatitis B blood test.  Sexually transmitted disease (STD) testing.  Diabetes screening. This is done by checking your blood sugar (glucose) after you have not eaten for a while (fasting). You may have this done every 1-3 years.  Bone density scan. This is done to screen for osteoporosis. You may have this done starting at age 62.  Mammogram. This may be done every 1-2 years. Talk to your health care provider about how often you should have regular mammograms. Talk with your health care provider about your test results,  treatment options, and if necessary, the need for more tests. Vaccines  Your health care provider may recommend certain vaccines, such as:  Influenza vaccine. This is recommended every year.  Tetanus, diphtheria, and acellular pertussis (Tdap, Td) vaccine. You may need a Td booster every 10  years.  Zoster vaccine. You may need this after age 20.  Pneumococcal 13-valent conjugate (PCV13) vaccine. One dose is recommended after age 16.  Pneumococcal polysaccharide (PPSV23) vaccine. One dose is recommended after age 7. Talk to your health care provider about which screenings and vaccines you need and how often you need them. This information is not intended to replace advice given to you by your health care provider. Make sure you discuss any questions you have with your health care provider. Document Released: 12/19/2015 Document Revised: 08/11/2016 Document Reviewed: 09/23/2015 Elsevier Interactive Patient Education  2017 [place] Prevention in the Home Falls can cause injuries. They can happen to people of all ages. There are many things you can do to make your home safe and to help prevent falls. What can I do on the outside of my home?  Regularly fix the edges of walkways and driveways and fix any cracks.  Remove anything that might make you trip as you walk through a door, such as a raised step or threshold.  Trim any bushes or trees on the path to your home.  Use bright outdoor lighting.  Clear any walking paths of anything that might make someone trip, such as rocks or tools.  Regularly check to see if handrails are loose or broken. Make sure that both sides of any steps have handrails.  Any raised decks and porches should have guardrails on the edges.  Have any leaves, snow, or ice cleared regularly.  Use sand or salt on walking paths during winter.  Clean up any spills in your garage right away. This includes oil or grease spills. What can I do in the bathroom?  Use night lights.  Install grab bars by the toilet and in the tub and shower. Do not use towel bars as grab bars.  Use non-skid mats or decals in the tub or shower.  If you need to sit down in the shower, use a plastic, non-slip stool.  Keep the floor dry. Clean up any water that  spills on the floor as soon as it happens.  Remove soap buildup in the tub or shower regularly.  Attach bath mats securely with double-sided non-slip rug tape.  Do not have throw rugs and other things on the floor that can make you trip. What can I do in the bedroom?  Use night lights.  Make sure that you have a light by your bed that is easy to reach.  Do not use any sheets or blankets that are too big for your bed. They should not hang down onto the floor.  Have a firm chair that has side arms. You can use this for support while you get dressed.  Do not have throw rugs and other things on the floor that can make you trip. What can I do in the kitchen?  Clean up any spills right away.  Avoid walking on wet floors.  Keep items that you use a lot in easy-to-reach places.  If you need to reach something above you, use a strong step stool that has a grab bar.  Keep electrical cords out of the way.  Do not use floor polish or wax that makes floors  slippery. If you must use wax, use non-skid floor wax.  Do not have throw rugs and other things on the floor that can make you trip. What can I do with my stairs?  Do not leave any items on the stairs.  Make sure that there are handrails on both sides of the stairs and use them. Fix handrails that are broken or loose. Make sure that handrails are as long as the stairways.  Check any carpeting to make sure that it is firmly attached to the stairs. Fix any carpet that is loose or worn.  Avoid having throw rugs at the top or bottom of the stairs. If you do have throw rugs, attach them to the floor with carpet tape.  Make sure that you have a light switch at the top of the stairs and the bottom of the stairs. If you do not have them, ask someone to add them for you. What else can I do to help prevent falls?  Wear shoes that:  Do not have high heels.  Have rubber bottoms.  Are comfortable and fit you well.  Are closed at the  toe. Do not wear sandals.  If you use a stepladder:  Make sure that it is fully opened. Do not climb a closed stepladder.  Make sure that both sides of the stepladder are locked into place.  Ask someone to hold it for you, if possible.  Clearly mark and make sure that you can see:  Any grab bars or handrails.  First and last steps.  Where the edge of each step is.  Use tools that help you move around (mobility aids) if they are needed. These include:  Canes.  Walkers.  Scooters.  Crutches.  Turn on the lights when you go into a dark area. Replace any light bulbs as soon as they burn out.  Set up your furniture so you have a clear path. Avoid moving your furniture around.  If any of your floors are uneven, fix them.  If there are any pets around you, be aware of where they are.  Review your medicines with your doctor. Some medicines can make you feel dizzy. This can increase your chance of falling. Ask your doctor what other things that you can do to help prevent falls. This information is not intended to replace advice given to you by your health care provider. Make sure you discuss any questions you have with your health care provider. Document Released: 09/18/2009 Document Revised: 04/29/2016 Document Reviewed: 12/27/2014 Elsevier Interactive Patient Education  2017 [employer].

## 2020-03-25 NOTE — Progress Notes (Signed)
Complete physical exam    Patient: Teresa Harris   DOB: [birth year]   79 y.o. Female  MRN: [id number] Visit Date: 03/31/2020  Today's healthcare provider: [name], PA-C  Subjective:    Chief Complaint  Patient presents with  . Annual Exam    Teresa Harris is a 79 y.o. female who presents today for a complete physical exam.   She reports consuming a general diet. The patient does not participate in regular exercise at present. She generally feels fairly well. She reports sleeping fairly well. She does not have additional problems to discuss today.   HPI  She is concerned about her hearing.  Past Medical History:  Diagnosis Date  . Arthritis   . Atrial fibrillation ([place]) 10/01/14  . Complication of anesthesia    trouble waking up with Propofol  . Depression    h/o  . Dysrhythmia    a fib  . Family history of adverse reaction to anesthesia    daughter PONV  . Glaucoma    left eye  . Headache    H/O MIGRAINES  . Hemorrhoids   . Hiatal hernia   . History of kidney stones   . History of migraine headaches   . Hx MRSA infection   . Hypercholesteremia   . Kidney calculi   . Osteopenia   . PONV (postoperative nausea and vomiting)    Past Surgical History:  Procedure Laterality Date  . ABDOMINAL HYSTERECTOMY    . AUGMENTATION MAMMAPLASTY Bilateral 2004  . CARPAL TUNNEL RELEASE Right 02/16/2016   Procedure: CARPAL TUNNEL RELEASE;  Surgeon: Dereck Leep, MD;  Location: [facility] OR[facility];  Service: Orthopedics;  Laterality: Right;  . CARPAL TUNNEL RELEASE Left 04/12/2016   Procedure: CARPAL TUNNEL RELEASE;  Surgeon: Dereck Leep, MD;  Location: [facility] OR[facility];  Service: Orthopedics;  Laterality: Left;  . CARPAL TUNNEL RELEASE Right 03/13/2018   Procedure: CARPAL TUNNEL RELEASE;  Surgeon: Deetta Perla, MD;  Location: [facility] OR[facility];  Service: Neurosurgery;  Laterality: Right;  . CATARACT EXTRACTION, BILATERAL    . COLONOSCOPY  12/29/09   Dr Dionne Milo  . EYE SURGERY Left     shunt placed  . EYE SURGERY Left    tube placed  . FOOT SURGERY     bilateral   . JOINT REPLACEMENT    . NASAL SEPTUM SURGERY    . REPLACEMENT TOTAL KNEE     bilateral  . SHOULDER SURGERY     right shoulder   Social History   Socioeconomic History  . Marital status: Divorced    Spouse name: Not on file  . Number of children: 2  . Years of education: Not on file  . Highest education level: Some college, no degree  Occupational History  . Occupation: retired  Tobacco Use  . Smoking status: Former Smoker    Packs/day: 1.00    Years: 30.00    Pack years: 30.00    Types: Cigarettes    Quit date: 02/09/1996    Years since quitting: 24.1  . Smokeless tobacco: Never Used  Substance and Sexual Activity  . Alcohol use: Yes    Comment: maybe twice a year- 1 mixed drink  . Drug use: No  . Sexual activity: Not on file  Other Topics Concern  . Not on file  Social History Narrative  . Not on file   Social Determinants of Health   Financial Resource Strain: Low Risk   . Difficulty of Paying Living Expenses:  Not hard at all  Food Insecurity: No Food Insecurity  . Worried About Charity fundraiser in the Last Year: Never true  . Ran Out of Food in the Last Year: Never true  Transportation Needs: No Transportation Needs  . Lack of Transportation (Medical): No  . Lack of Transportation (Non-Medical): No  Physical Activity: Inactive  . Days of Exercise per Week: 0 days  . Minutes of Exercise per Session: 0 min  Stress: No Stress Concern Present  . Feeling of Stress : Only a little  Social Connections: Moderately Isolated  . Frequency of Communication with Friends and Family: More than three times a week  . Frequency of Social Gatherings with Friends and Family: More than three times a week  . Attends Religious Services: Never  . Active Member of Clubs or Organizations: No  . Attends Archivist Meetings: Never  . Marital Status: Divorced  Human resources officer Violence:  Not At Risk  . Fear of Current or Ex-Partner: No  . Emotionally Abused: No  . Physically Abused: No  . Sexually Abused: No   Family Status  Relation Name Status  . Mother  Deceased at age 49       alzheimer's disease, Parkinsonism, Osteoporosis  . Father  Deceased at age 49       pneumonia  . Brother  Deceased       suicide  . MGM  Deceased at age 22        died from natural causes  . MGF  (Not Specified)       Brain Tumor  . Neg Hx  (Not Specified)   Family History  Problem Relation Age of Onset  . Arthritis Mother   . Arthritis Father   . Breast cancer Neg Hx    Allergies  Allergen Reactions  . [name] [Moxifloxacin Hcl In Nacl] Other (See Comments)    Headache  . Bimatoprost Other (See Comments)    Made my eyes red, and hurt Made my eyes red, and hurt Made my eyes red, and hurt Made my eyes red, and hurt  . Brimonidine Other (See Comments)    Made my eyes red, and hurt Made my eyes red, and hurt Burned eyes.  . Mobic [Meloxicam] Other (See Comments)    headache  . Propofol Other (See Comments)    Had difficulty waking up Had difficulty waking up Difficult to wake up.  . Travoprost Other (See Comments)    Unknown  . Vicodin [Hydrocodone-Acetaminophen] Nausea And Vomiting and Other (See Comments)    Sick on stomach  . Cephalexin Other (See Comments) and Nausea And Vomiting    headache headache headache    Patient Care Team: [name], PA-C as PCP - General (Family Medicine) Wellington Hampshire, MD as Consulting Physician (Cardiology) Eulogio Bear, MD as Consulting Physician (Ophthalmology) Sharlet Salina, MD as Referring Physician (Physical Medicine and Rehabilitation) Beverly Gust, MD (Otolaryngology)   Medications: Outpatient Medications Prior to Visit  Medication Sig  . Calcium Carbonate-Vitamin D (CALCIUM-VITAMIN D3 PO) Take 1 tablet by mouth daily. Calcium 600 = D3 1000  . celecoxib (CELEBREX) 200 MG capsule Take 200 mg by  mouth daily.  . cholecalciferol (VITAMIN D) 400 units TABS tablet Take 400 Units by mouth 2 (two) times daily.   . Cholecalciferol (VITAMIN D3) 50 MCG (2000 UT) capsule Take 2,000 Units by mouth daily.  . cycloSPORINE (RESTASIS) 0.05 % ophthalmic emulsion Place 1 drop into both eyes 2 (two)  times daily.   . digoxin (LANOXIN) 0.125 MG tablet TAKE 1 TABLET BY MOUTH ONCE EVERY MORNING (Patient taking differently: Take 0.0625 mg by mouth daily. )  . dorzolamide-timolol (COSOPT) 22.3-6.8 MG/ML ophthalmic solution Place 1 drop into both eyes 2 (two) times daily.   Marland Kitchen ELIQUIS 5 MG TABS tablet TAKE 1 TABLET BY MOUTH TWICE DAILY  . furosemide (LASIX) 20 MG tablet TAKE 1 TABLET BY MOUTH ONCE DAILY  . latanoprost (XALATAN) 0.005 % ophthalmic solution Place 1 drop into both eyes daily.   Marland Kitchen LYRICA 100 MG capsule TAKE 1 CAPSULE BY MOUTH ONCE DAILY  . Magnesium 400 MG CAPS Take 400 mg by mouth daily.   . metoprolol succinate (TOPROL-XL) 25 MG 24 hr tablet TAKE 1/2 TABLET BY MOUTH ONCE DAILY  . METRONIDAZOLE, TOPICAL, 0.75 % LOTN Apply topically daily. Apply to face in AM  . Multiple Vitamin (MULTIVITAMIN) tablet Take 1 tablet by mouth daily.  . NON FORMULARY Place 12 sprays into both nostrils 6 (six) times daily. 250GU irrigant  . Omega-3 Fatty Acids (FISH OIL) 1200 MG CAPS Take by mouth daily.   Marland Kitchen omeprazole (PRILOSEC) 40 MG capsule Take 40 mg by mouth every morning.   Marland Kitchen PREMARIN 0.625 MG tablet TAKE 1 TABLET BY MOUTH ONCE DAILY  . SOOLANTRA 1 % CREA Apply 1 application topically daily. Sometimes uses twice daily as needed for flare ups.   No facility-administered medications prior to visit.    Review of Systems  Constitutional: Negative.   HENT: Positive for congestion, hearing loss and sinus pressure.   Eyes: Positive for photophobia and visual disturbance.  Respiratory: Negative.   Cardiovascular: Negative.   Gastrointestinal: Negative.   Endocrine: Negative.   Genitourinary: Negative.     Musculoskeletal: Negative.   Skin: Negative.   Allergic/Immunologic: Positive for environmental allergies.  Neurological: Negative.   Hematological: Negative.   Psychiatric/Behavioral: Negative.     Last CBC Lab Results  Component Value Date   WBC 5.4 11/09/2019   HGB 14.5 11/09/2019   HCT 41.6 11/09/2019   MCV 95 11/09/2019   MCH 33.3 (H) 11/09/2019   RDW 12.4 11/09/2019   PLT 162 [id number]   Last metabolic panel Lab Results  Component Value Date   GLUCOSE 118 (H) 11/09/2019   NA 141 11/09/2019   K 4.5 11/09/2019   CL 105 11/09/2019   CO2 20 11/09/2019   BUN 20 11/09/2019   CREATININE 0.83 11/09/2019   GFRNONAA 68 11/09/2019   GFRAA 79 11/09/2019   CALCIUM 9.2 11/09/2019   PROT 6.9 02/02/2019   ALBUMIN 3.9 02/02/2019   LABGLOB 3.2 12/30/2017   AGRATIO 1.3 12/30/2017   BILITOT 0.6 02/02/2019   ALKPHOS 45 02/02/2019   AST 25 02/02/2019   ALT 35 (H) 02/02/2019   ANIONGAP 5 12/07/2018   Last lipids Lab Results  Component Value Date   CHOL 172 02/02/2019   HDL 40 02/02/2019   LDLCALC Comment 02/02/2019   TRIG 420 (H) 02/02/2019   CHOLHDL 4.3 02/02/2019   Last hemoglobin A1c No results found for: HGBA1C      Objective:    BP 131/67 (BP Location: Left Arm, Patient Position: Sitting, Cuff Size: Large)   Pulse 67   Temp (!) 97.1 F (36.2 C) (Temporal)   Resp 16   Ht 5\' 8"  (1.727 m)   Wt 187 lb 3.2 oz (84.9 kg)   BMI 28.46 kg/m  BP Readings from Last 3 Encounters:  03/31/20 131/67  11/09/19 110/80  02/08/19  122/68   Wt Readings from Last 3 Encounters:  03/31/20 187 lb 3.2 oz (84.9 kg)  11/09/19 191 lb 4 oz (86.8 kg)  02/08/19 191 lb (86.6 kg)      Physical Exam Vitals reviewed.  Constitutional:      General: She is not in acute distress.    Appearance: Normal appearance. She is well-developed. She is obese. She is not ill-appearing or diaphoretic.  HENT:     Head: Normocephalic and atraumatic.     Right Ear: Tympanic membrane, ear canal  and external ear normal. Decreased hearing noted.     Left Ear: Tympanic membrane, ear canal and external ear normal. Decreased hearing noted.  Eyes:     General: No scleral icterus.       Right eye: No discharge.        Left eye: No discharge.     Extraocular Movements: Extraocular movements intact.     Conjunctiva/sclera: Conjunctivae normal.     Pupils: Pupils are equal, round, and reactive to light.  Neck:     Thyroid: No thyromegaly.     Vascular: No carotid bruit or JVD.     Trachea: No tracheal deviation.  Cardiovascular:     Rate and Rhythm: Normal rate. Rhythm irregularly irregular.     Pulses: Normal pulses.     Heart sounds: Normal heart sounds. No murmur. No friction rub. No gallop.   Pulmonary:     Effort: Pulmonary effort is normal. No respiratory distress.     Breath sounds: Normal breath sounds. No wheezing or rales.  Chest:     Chest wall: No tenderness.  Abdominal:     General: Abdomen is flat. Bowel sounds are normal. There is no distension.     Palpations: Abdomen is soft. There is no mass.     Tenderness: There is no abdominal tenderness. There is no guarding or rebound.  Musculoskeletal:        General: No tenderness. Normal range of motion.     Cervical back: Normal range of motion and neck supple.     Right lower leg: No edema.     Left lower leg: No edema.  Lymphadenopathy:     Cervical: No cervical adenopathy.  Skin:    General: Skin is warm and dry.     Capillary Refill: Capillary refill takes less than 2 seconds.     Findings: No rash.  Neurological:     General: No focal deficit present.     Mental Status: She is alert and oriented to person, place, and time. Mental status is at baseline.  Psychiatric:        Mood and Affect: Mood normal.        Behavior: Behavior normal.        Thought Content: Thought content normal.        Judgment: Judgment normal.       Depression Screen  PHQ 2/9 Scores 03/17/2020 01/26/2019 12/28/2017  PHQ - 2 Score 0  0 0  PHQ- 9 Score - 2 -    Hearing Screening   Method: Audiometry   125Hz  250Hz  500Hz  1000Hz  2000Hz  3000Hz  4000Hz  6000Hz  8000Hz   Right ear:   Fail Fail Fail  Fail    Left ear:   Fail Fail Fail  Fail    Comments: Pass 1000 Right ear at 40 dbHL Pass 1000 and 2000 Left ear at [place]:    Routine Health Maintenance and Physical Exam  Exercise Activities and Dietary recommendations Goals    . DIET - INCREASE WATER INTAKE     Recommend increasing water intake to 6-8 glasses a day.     Marland Kitchen DIET - REDUCE CALORIE INTAKE     Recommend to continue current diet plan of cutting down on calories and carbohydrates to help aid in weight loss.        Immunization History  Administered Date(s) Administered  . Fluad Quad(high Dose 65+) 08/29/2019  . Influenza, High Dose Seasonal PF 08/26/2016, 09/07/2017, 08/17/2018  . Influenza,inj,Quad PF,6+ Mos 09/09/2014  . Influenza-Unspecified 10/08/2015  . Pneumococcal Conjugate-13 11/11/2014  . Pneumococcal Polysaccharide-23 10/27/2012  . Tdap 12/03/2010    Health Maintenance  Topic Date Due  . COVID-19 Vaccine (1) Never done  . INFLUENZA VACCINE  07/06/2020  . TETANUS/TDAP  12/03/2020  . DEXA SCAN  02/08/2022  . PNA vac Low Risk Adult  Completed    Discussed health benefits of physical activity, and encouraged her to engage in regular exercise appropriate for her age and condition.  1. Annual physical exam Normal physical exam today. Will check labs as below and f/u pending lab results. If labs are stable and WNL she will not need to have these rechecked for one year at her next annual physical exam. She is to call the office in the meantime if she has any acute issue, questions or concerns.  2. Breast cancer screening by mammogram Breast exam today was normal. There is no family history of breast cancer. She does perform regular self breast exams. Mammogram was ordered as below. Due in September. Information for [facility]  Breast clinic was given to patient so she may schedule her mammogram at her convenience. - MM 3D Screening Bilateral; Future  3. Atrial fibrillation, chronic ([place]) Followed by Cardiology. Continue Digoxin and Eliquis. Will check labs as below and f/u pending results. - CBC with Differential - Hemoglobin A1c - Lipid panel - Comprehensive Metabolic Panel (CMET)  4. Essential hypertension Stable. Continue current medical treatment plan of Metoprolol XR 25mg . - CBC with Differential - Hemoglobin A1c - Lipid panel - Comprehensive Metabolic Panel (CMET)  5. Pure hypercholesterolemia Diet controlled. Will check labs as below and f/u pending results. - CBC with Differential - Hemoglobin A1c - Lipid panel - Comprehensive Metabolic Panel (CMET)  6. Avitaminosis D Stable on OTC supplement.  - CBC with Differential - Vitamin D (25 hydroxy)  7. Sensorineural hearing loss (SNHL) of both ears Patient is a patient at [facility] and reports she will call them to schedule.    No follow-ups on file.     Reynolds Bowl, PA-C, have reviewed all documentation for this visit. The documentation on 04/02/20 for the exam, diagnosis, procedures, and orders are all accurate and complete.   Rubye Beach  [facility] [phone] (phone) [phone] (fax)  [place]

## 2020-03-31 ENCOUNTER — Other Ambulatory Visit: Payer: Self-pay

## 2020-03-31 ENCOUNTER — Encounter: Payer: Self-pay | Admitting: Physician Assistant

## 2020-03-31 ENCOUNTER — Ambulatory Visit (INDEPENDENT_AMBULATORY_CARE_PROVIDER_SITE_OTHER): Payer: PPO | Admitting: Physician Assistant

## 2020-03-31 VITALS — BP 131/67 | HR 67 | Temp 97.1°F | Resp 16 | Ht 68.0 in | Wt 187.2 lb

## 2020-03-31 DIAGNOSIS — E559 Vitamin D deficiency, unspecified: Secondary | ICD-10-CM | POA: Diagnosis not present

## 2020-03-31 DIAGNOSIS — Z0000 Encounter for general adult medical examination without abnormal findings: Secondary | ICD-10-CM | POA: Diagnosis not present

## 2020-03-31 DIAGNOSIS — E7800 Pure hypercholesterolemia, unspecified: Secondary | ICD-10-CM

## 2020-03-31 DIAGNOSIS — I10 Essential (primary) hypertension: Secondary | ICD-10-CM

## 2020-03-31 DIAGNOSIS — H903 Sensorineural hearing loss, bilateral: Secondary | ICD-10-CM | POA: Diagnosis not present

## 2020-03-31 DIAGNOSIS — I482 Chronic atrial fibrillation, unspecified: Secondary | ICD-10-CM | POA: Diagnosis not present

## 2020-03-31 DIAGNOSIS — Z1231 Encounter for screening mammogram for malignant neoplasm of breast: Secondary | ICD-10-CM | POA: Diagnosis not present

## 2020-03-31 NOTE — Patient Instructions (Signed)
Health Maintenance After Age 78 After age 78, you are at a higher risk for certain long-term diseases and infections as well as injuries from falls. Falls are a major cause of broken bones and head injuries in people who are older than age 78. Getting regular preventive care can help to keep you healthy and well. Preventive care includes getting regular testing and making lifestyle changes as recommended by your health care provider. Talk with your health care provider about:  Which screenings and tests you should have. A screening is a test that checks for a disease when you have no symptoms.  A diet and exercise plan that is right for you. What should I know about screenings and tests to prevent falls? Screening and testing are the best ways to find a health problem early. Early diagnosis and treatment give you the best chance of managing medical conditions that are common after age 78. Certain conditions and lifestyle choices may make you more likely to have a fall. Your health care provider may recommend:  Regular vision checks. Poor vision and conditions such as cataracts can make you more likely to have a fall. If you wear glasses, make sure to get your prescription updated if your vision changes.  Medicine review. Work with your health care provider to regularly review all of the medicines you are taking, including over-the-counter medicines. Ask your health care provider about any side effects that may make you more likely to have a fall. Tell your health care provider if any medicines that you take make you feel dizzy or sleepy.  Osteoporosis screening. Osteoporosis is a condition that causes the bones to get weaker. This can make the bones weak and cause them to break more easily.  Blood pressure screening. Blood pressure changes and medicines to control blood pressure can make you feel dizzy.  Strength and balance checks. Your health care provider may recommend certain tests to check your  strength and balance while standing, walking, or changing positions.  Foot health exam. Foot pain and numbness, as well as not wearing proper footwear, can make you more likely to have a fall.  Depression screening. You may be more likely to have a fall if you have a fear of falling, feel emotionally low, or feel unable to do activities that you used to do.  Alcohol use screening. Using too much alcohol can affect your balance and may make you more likely to have a fall. What actions can I take to lower my risk of falls? General instructions  Talk with your health care provider about your risks for falling. Tell your health care provider if: ? You fall. Be sure to tell your health care provider about all falls, even ones that seem minor. ? You feel dizzy, sleepy, or off-balance.  Take over-the-counter and prescription medicines only as told by your health care provider. These include any supplements.  Eat a healthy diet and maintain a healthy weight. A healthy diet includes low-fat dairy products, low-fat (lean) meats, and fiber from whole grains, beans, and lots of fruits and vegetables. Home safety  Remove any tripping hazards, such as rugs, cords, and clutter.  Install safety equipment such as grab bars in bathrooms and safety rails on stairs.  Keep rooms and walkways well-lit. Activity   Follow a regular exercise program to stay fit. This will help you maintain your balance. Ask your health care provider what types of exercise are appropriate for you.  If you need a cane or   walker, use it as recommended by your health care provider.  Wear supportive shoes that have nonskid soles. Lifestyle  Do not drink alcohol if your health care provider tells you not to drink.  If you drink alcohol, limit how much you have: ? 0-1 drink a day for women. ? 0-2 drinks a day for men.  Be aware of how much alcohol is in your drink. In the [place] one drink equals one typical bottle of beer (12  oz), one-half glass of wine (5 oz), or one shot of hard liquor (1 oz).  Do not use any products that contain nicotine or tobacco, such as cigarettes and e-cigarettes. If you need help quitting, ask your health care provider. Summary  Having a healthy lifestyle and getting preventive care can help to protect your health and wellness after age 78.  Screening and testing are the best way to find a health problem early and help you avoid having a fall. Early diagnosis and treatment give you the best chance for managing medical conditions that are more common for people who are older than age 78.  Falls are a major cause of broken bones and head injuries in people who are older than age 78. Take precautions to prevent a fall at home.  Work with your health care provider to learn what changes you can make to improve your health and wellness and to prevent falls. This information is not intended to replace advice given to you by your health care provider. Make sure you discuss any questions you have with your health care provider. Document Revised: 03/15/2019 Document Reviewed: 10/05/2017 Elsevier Patient Education  2020 Elsevier Inc.  

## 2020-04-01 DIAGNOSIS — H02403 Unspecified ptosis of bilateral eyelids: Secondary | ICD-10-CM | POA: Diagnosis not present

## 2020-04-01 DIAGNOSIS — H53483 Generalized contraction of visual field, bilateral: Secondary | ICD-10-CM | POA: Diagnosis not present

## 2020-04-01 DIAGNOSIS — H04123 Dry eye syndrome of bilateral lacrimal glands: Secondary | ICD-10-CM | POA: Diagnosis not present

## 2020-04-14 DIAGNOSIS — M5136 Other intervertebral disc degeneration, lumbar region: Secondary | ICD-10-CM | POA: Diagnosis not present

## 2020-04-14 DIAGNOSIS — M5416 Radiculopathy, lumbar region: Secondary | ICD-10-CM | POA: Diagnosis not present

## 2020-04-14 DIAGNOSIS — M545 Low back pain: Secondary | ICD-10-CM | POA: Diagnosis not present

## 2020-04-16 ENCOUNTER — Other Ambulatory Visit: Payer: Self-pay | Admitting: Physician Assistant

## 2020-04-16 DIAGNOSIS — I482 Chronic atrial fibrillation, unspecified: Secondary | ICD-10-CM | POA: Diagnosis not present

## 2020-04-16 DIAGNOSIS — E559 Vitamin D deficiency, unspecified: Secondary | ICD-10-CM | POA: Diagnosis not present

## 2020-04-16 DIAGNOSIS — I10 Essential (primary) hypertension: Secondary | ICD-10-CM | POA: Diagnosis not present

## 2020-04-16 DIAGNOSIS — E7800 Pure hypercholesterolemia, unspecified: Secondary | ICD-10-CM | POA: Diagnosis not present

## 2020-04-17 ENCOUNTER — Telehealth: Payer: Self-pay

## 2020-04-17 LAB — COMPREHENSIVE METABOLIC PANEL
ALT: 23 IU/L (ref 0–32)
AST: 27 IU/L (ref 0–40)
Albumin/Globulin Ratio: 1.2 (ref 1.2–2.2)
Albumin: 3.8 g/dL (ref 3.7–4.7)
Alkaline Phosphatase: 54 IU/L (ref 39–117)
BUN/Creatinine Ratio: 20 (ref 12–28)
BUN: 16 mg/dL (ref 8–27)
Bilirubin Total: 0.5 mg/dL (ref 0.0–1.2)
CO2: 23 mmol/L (ref 20–29)
Calcium: 9.1 mg/dL (ref 8.7–10.3)
Chloride: 104 mmol/L (ref 96–106)
Creatinine, Ser: 0.8 mg/dL (ref 0.57–1.00)
GFR calc Af Amer: 82 mL/min/{1.73_m2} (ref >59)
GFR calc non Af Amer: 71 mL/min/{1.73_m2} (ref >59)
Globulin, Total: 3.1 g/dL (ref 1.5–4.5)
Glucose: 113 mg/dL — ABNORMAL HIGH (ref 65–99)
Potassium: 4.8 mmol/L (ref 3.5–5.2)
Sodium: 140 mmol/L (ref 134–144)
Total Protein: 6.9 g/dL (ref 6.0–8.5)

## 2020-04-17 LAB — CBC WITH DIFFERENTIAL/PLATELET
Basophils Absolute: 0.1 10*3/uL (ref 0.0–0.2)
Basos: 1 %
EOS (ABSOLUTE): 0.1 10*3/uL (ref 0.0–0.4)
Eos: 3 %
Hematocrit: 42.6 % (ref 34.0–46.6)
Hemoglobin: 15 g/dL (ref 11.1–15.9)
Immature Grans (Abs): 0 10*3/uL (ref 0.0–0.1)
Immature Granulocytes: 0 %
Lymphocytes Absolute: 1.3 10*3/uL (ref 0.7–3.1)
Lymphs: 28 %
MCH: 33.6 pg — ABNORMAL HIGH (ref 26.6–33.0)
MCHC: 35.2 g/dL (ref 31.5–35.7)
MCV: 96 fL (ref 79–97)
Monocytes Absolute: 0.4 10*3/uL (ref 0.1–0.9)
Monocytes: 9 %
Neutrophils Absolute: 2.7 10*3/uL (ref 1.4–7.0)
Neutrophils: 59 %
Platelets: 179 10*3/uL (ref 150–450)
RBC: 4.46 x10E6/uL (ref 3.77–5.28)
RDW: 12.8 % (ref 11.7–15.4)
WBC: 4.6 10*3/uL (ref 3.4–10.8)

## 2020-04-17 LAB — VITAMIN D 25 HYDROXY (VIT D DEFICIENCY, FRACTURES): Vit D, 25-Hydroxy: 64.5 ng/mL (ref 30.0–100.0)

## 2020-04-17 LAB — HEMOGLOBIN A1C
Est. average glucose Bld gHb Est-mCnc: 128 mg/dL
Hgb A1c MFr Bld: 6.1 % — ABNORMAL HIGH (ref 4.8–5.6)

## 2020-04-17 LAB — LIPID PANEL
Chol/HDL Ratio: 5.2 ratio — ABNORMAL HIGH (ref 0.0–4.4)
Cholesterol, Total: 181 mg/dL (ref 100–199)
HDL: 35 mg/dL — ABNORMAL LOW (ref >39)
LDL Chol Calc (NIH): 61 mg/dL (ref 0–99)
Triglycerides: 562 mg/dL (ref 0–149)
VLDL Cholesterol Cal: 85 mg/dL — ABNORMAL HIGH (ref 5–40)

## 2020-04-17 NOTE — Telephone Encounter (Signed)
-----   Message from [name], [place] sent at 04/17/2020 12:57 PM EDT ----- Blood count is normal. Kidney and liver function is normal. Sodium, potassium and calcium are normal. Cholesterol is normal, but triglycerides are high. This could be from a non-fasting lab. A1c is up slightly in a pre-diabetic range at 6.1. Work on heart healthy dieting habits with limiting fatty foods, salt, red meats, and sugars from diet. Vit D is normal.

## 2020-04-17 NOTE — Telephone Encounter (Signed)
Patient advised as directed below.  FYI-She reports that she was fasting for her labs.

## 2020-04-17 NOTE — Telephone Encounter (Signed)
Well may want to consider zetia or fenofibrate for triglycerides to help lower cardiovascular risk.

## 2020-04-18 NOTE — Telephone Encounter (Signed)
Patient called, left VM to return the call to the office for a message from the provider. 

## 2020-04-18 NOTE — Telephone Encounter (Signed)
LMTCB since she was fasting for her labs she may consider Zetia or Fenofibrate to triglycerides to help cardiovascular risk.  If patient calls back OK for PEC to give message to patient and see if she agree.

## 2020-04-18 NOTE — Telephone Encounter (Signed)
Returned call no answer. Will try on Monday.

## 2020-04-21 NOTE — Telephone Encounter (Signed)
Patient stated she will get back to the provider if she decides.

## 2020-04-28 DIAGNOSIS — H04123 Dry eye syndrome of bilateral lacrimal glands: Secondary | ICD-10-CM | POA: Diagnosis not present

## 2020-04-28 DIAGNOSIS — H57813 Brow ptosis, bilateral: Secondary | ICD-10-CM | POA: Diagnosis not present

## 2020-04-28 DIAGNOSIS — H02831 Dermatochalasis of right upper eyelid: Secondary | ICD-10-CM | POA: Diagnosis not present

## 2020-04-28 DIAGNOSIS — H02403 Unspecified ptosis of bilateral eyelids: Secondary | ICD-10-CM | POA: Diagnosis not present

## 2020-04-28 DIAGNOSIS — H02834 Dermatochalasis of left upper eyelid: Secondary | ICD-10-CM | POA: Diagnosis not present

## 2020-04-28 DIAGNOSIS — H029 Unspecified disorder of eyelid: Secondary | ICD-10-CM | POA: Diagnosis not present

## 2020-04-29 ENCOUNTER — Other Ambulatory Visit: Payer: Self-pay | Admitting: Cardiovascular Disease

## 2020-05-06 ENCOUNTER — Other Ambulatory Visit: Payer: Self-pay | Admitting: Physician Assistant

## 2020-05-06 DIAGNOSIS — N951 Menopausal and female climacteric states: Secondary | ICD-10-CM

## 2020-05-06 NOTE — Telephone Encounter (Signed)
Requested medication (s) are due for refill today - yes  Requested medication (s) are on the active medication list -yes  Future visit scheduled -yes  Last refill: 02/14/20  Notes to clinic: Patient requesting RF on medication not reviewed at [facility]-03/31/20- sent for review of request  Requested Prescriptions  Pending Prescriptions Disp Refills   PREMARIN 0.625 MG tablet [Pharmacy Med Name: PREMARIN 0.625 MG TAB] 90 tablet 3    Sig: TAKE 1 TABLET BY MOUTH ONCE DAILY      OB/GYN:  Estrogens Failed - 05/06/2020  9:41 AM      Failed - Mammogram is up-to-date per Health Maintenance      Failed - Valid encounter within last 12 months    Recent Outpatient Visits           1 month ago Annual physical exam   [facility] [place], Clearnce Sorrel, PA-C   3 months ago Adverse effect of vaccine, initial encounter   [facility] [place], [place], [place]   4 months ago Acute non-recurrent pansinusitis   [facility] [place], Clearnce Sorrel, [place]   1 year ago Annual physical exam   [facility] [place], [place]   1 year ago Rash and nonspecific skin eruption   [facility] [place], Clearnce Sorrel, [place]       Future Appointments             In 3 weeks Sharolyn Douglas, Clance Boll, NP [facility], [place] BP in normal range    BP Readings from Last 1 Encounters:  03/31/20 131/67              Requested Prescriptions  Pending Prescriptions Disp Refills   PREMARIN 0.625 MG tablet [Pharmacy Med Name: PREMARIN 0.625 MG TAB] 90 tablet 3    Sig: TAKE 1 TABLET BY MOUTH ONCE DAILY      OB/GYN:  Estrogens Failed - 05/06/2020  9:41 AM      Failed - Mammogram is up-to-date per Health Maintenance      Failed - Valid encounter within last 12 months    Recent Outpatient Visits           1 month ago Annual physical exam   [place], PA-C    3 months ago Adverse effect of vaccine, initial encounter   [place], [place], [place]   4 months ago Acute non-recurrent pansinusitis   [facility] [place], Clearnce Sorrel, [place]   1 year ago Annual physical exam   [place], [place], [place]   1 year ago Rash and nonspecific skin eruption   [place], [place]       Future Appointments             In 3 weeks Theora Gianotti, NP [facility], [place] BP in normal range    BP Readings from Last 1 Encounters:  03/31/20 131/67

## 2020-05-09 ENCOUNTER — Ambulatory Visit: Payer: PPO | Admitting: Cardiovascular Disease

## 2020-05-09 ENCOUNTER — Ambulatory Visit: Payer: PPO | Admitting: Nurse Practitioner

## 2020-05-19 ENCOUNTER — Other Ambulatory Visit: Payer: Self-pay | Admitting: Cardiovascular Disease

## 2020-05-19 DIAGNOSIS — M5416 Radiculopathy, lumbar region: Secondary | ICD-10-CM | POA: Diagnosis not present

## 2020-05-19 DIAGNOSIS — M5136 Other intervertebral disc degeneration, lumbar region: Secondary | ICD-10-CM | POA: Diagnosis not present

## 2020-05-19 NOTE — Telephone Encounter (Signed)
Pt's age 79, wt 84.9 kg, SCr 0.8, CrCl 77.68, last ov w/ MA 11/09/19.

## 2020-05-19 NOTE — Telephone Encounter (Signed)
Please review for refill on Eliquis  

## 2020-05-22 ENCOUNTER — Other Ambulatory Visit: Payer: Self-pay | Admitting: Physician Assistant

## 2020-05-22 DIAGNOSIS — G629 Polyneuropathy, unspecified: Secondary | ICD-10-CM

## 2020-05-22 DIAGNOSIS — G2581 Restless legs syndrome: Secondary | ICD-10-CM

## 2020-05-30 ENCOUNTER — Encounter: Payer: Self-pay | Admitting: Nurse Practitioner

## 2020-05-30 ENCOUNTER — Ambulatory Visit: Payer: PPO | Admitting: Nurse Practitioner

## 2020-05-30 ENCOUNTER — Other Ambulatory Visit: Payer: Self-pay

## 2020-05-30 VITALS — BP 128/80 | HR 65 | Ht 68.0 in | Wt 185.4 lb

## 2020-05-30 DIAGNOSIS — I4821 Permanent atrial fibrillation: Secondary | ICD-10-CM | POA: Diagnosis not present

## 2020-05-30 DIAGNOSIS — E781 Pure hyperglyceridemia: Secondary | ICD-10-CM

## 2020-05-30 DIAGNOSIS — I5032 Chronic diastolic (congestive) heart failure: Secondary | ICD-10-CM | POA: Diagnosis not present

## 2020-05-30 MED ORDER — FENOFIBRATE 145 MG PO TABS: 145.0000 mg | ORAL_TABLET | Freq: Every day | ORAL | 3 refills | Status: DC

## 2020-05-30 MED ORDER — DIGOXIN 125 MCG PO TABS: ORAL_TABLET | ORAL | 0 refills | Status: DC

## 2020-05-30 NOTE — Patient Instructions (Signed)
Medication Instructions:  1- START Fenofibrate Take 1 tablet (145 mg total) by mouth daily *If you need a refill on your cardiac medications before your next appointment, please call your pharmacy*   Lab Work: Your physician recommends that you return for lab work in: 6 weeks at the medical mall. You will need to be fasting. (lipid, CMET, dig level) No appt is needed. Hours are M-F 7AM- 6 PM.  If you have labs (blood work) drawn today and your tests are completely normal, you will receive your results only by: Marland Kitchen MyChart Message (if you have MyChart) OR . A paper copy in the mail If you have any lab test that is abnormal or we need to change your treatment, we will call you to review the results.   Testing/Procedures: None ordered    Follow-Up: At [facility], you and your health needs are our priority.  As part of our continuing mission to provide you with exceptional heart care, we have created designated Provider Care Teams.  These Care Teams include your primary Cardiologist (physician) and Advanced Practice Providers (APPs -  Physician Assistants and Nurse Practitioners) who all work together to provide you with the care you need, when you need it.  We recommend signing up for the patient portal called "MyChart".  Sign up information is provided on this After Visit Summary.  MyChart is used to connect with patients for Virtual Visits (Telemedicine).  Patients are able to view lab/test results, encounter notes, upcoming appointments, etc.  Non-urgent messages can be sent to your provider as well.   To learn more about what you can do with MyChart, go to [email or web address].    Your next appointment:   6 month(s)  The format for your next appointment:   In Person  Provider:    You may see Kathlyn Sacramento, MD or Murray Hodgkins, NP

## 2020-05-30 NOTE — Progress Notes (Signed)
Office Visit    Patient Name: Teresa Harris Date of Encounter: 05/30/2020  Primary Care Provider:  [name], PA-C Primary Cardiologist:  Kathlyn Sacramento, MD  Chief Complaint    79 year old female with a history of permanent atrial fibrillation, borderline diabetes, hyperlipidemia, and diastolic heart failure, presents for follow-up of A. fib.  Past Medical History    Past Medical History:  Diagnosis Date  . (HFpEF) heart failure with preserved ejection fraction ([place])    a. 12/2018 Echo: EF 60-65%, no rwma, mild MR. Mildly dil LA. Nl RV fxn. PASP 36mmHg.  . Arthritis   . Complication of anesthesia    trouble waking up with Propofol  . Depression    h/o  . Family history of adverse reaction to anesthesia    daughter PONV  . Glaucoma    left eye  . Headache    H/O MIGRAINES  . Hemorrhoids   . Hiatal hernia   . History of kidney stones   . History of migraine headaches   . History of stress test    a. 10/2014 MV: No ischemia.  Marland Kitchen Hx MRSA infection   . Hypercholesteremia   . Osteopenia   . Permanent atrial fibrillation ([place]) 10/01/2014   a. CHA2DS2VASc = 3-->eliquis.  Marland Kitchen PONV (postoperative nausea and vomiting)    Past Surgical History:  Procedure Laterality Date  . ABDOMINAL HYSTERECTOMY    . AUGMENTATION MAMMAPLASTY Bilateral 2004  . CARPAL TUNNEL RELEASE Right 02/16/2016   Procedure: CARPAL TUNNEL RELEASE;  Surgeon: Dereck Leep, MD;  Location: [facility] ORS;  Service: Orthopedics;  Laterality: Right;  . CARPAL TUNNEL RELEASE Left 04/12/2016   Procedure: CARPAL TUNNEL RELEASE;  Surgeon: Dereck Leep, MD;  Location: [facility] ORS;  Service: Orthopedics;  Laterality: Left;  . CARPAL TUNNEL RELEASE Right 03/13/2018   Procedure: CARPAL TUNNEL RELEASE;  Surgeon: Deetta Perla, MD;  Location: [facility] ORS;  Service: Neurosurgery;  Laterality: Right;  . CATARACT EXTRACTION, BILATERAL    . COLONOSCOPY  12/29/09   Dr Dionne Milo  . EYE SURGERY Left    shunt placed  . EYE  SURGERY Left    tube placed  . FOOT SURGERY     bilateral   . JOINT REPLACEMENT    . NASAL SEPTUM SURGERY    . REPLACEMENT TOTAL KNEE     bilateral  . SHOULDER SURGERY     right shoulder    Allergies  Allergies  Allergen Reactions  . [name] [Moxifloxacin Hcl In Nacl] Other (See Comments)    Headache  . Bimatoprost Other (See Comments)    Made my eyes red, and hurt Made my eyes red, and hurt Made my eyes red, and hurt Made my eyes red, and hurt  . Brimonidine Other (See Comments)    Made my eyes red, and hurt Made my eyes red, and hurt Burned eyes.  . Mobic [Meloxicam] Other (See Comments)    headache  . Propofol Other (See Comments)    Had difficulty waking up Had difficulty waking up Difficult to wake up.  . Travoprost Other (See Comments)    Unknown  . Vicodin [Hydrocodone-Acetaminophen] Nausea And Vomiting and Other (See Comments)    Sick on stomach  . Cephalexin Other (See Comments) and Nausea And Vomiting    headache headache headache    History of Present Illness    79 year old female with above past medical history including permanent atrial fibrillation, borderline diabetes, hyperlipidemia, diastolic heart failure.  She previously smoked  but quit more than 30 years ago.  She underwent stress testing in November 2015 which was nonischemic.  Most recent echocardiogram in January 2020 showed normal LV function, mild MR, mildly dilated left atrium, normal RV function, and a PASP of 35 mmHg.  She was last seen in cardiology clinic in December 2020, at which time she was doing well.  Since her last cardiology visit, she has continued to do well.  She tries to stay busy in and around her house and in the garden.  She does not routinely exercise.  She denies chest pain, dyspnea, palpitations, PND, orthopnea, dizziness, syncope, or early satiety.  She sometimes notes mild lower extremity swelling but this is generally well controlled with daily low-dose furosemide.  In  May, she had repeat lab work which overall look pretty good though her triglycerides remain markedly elevated at 562.  She is interested in starting medication for hypertriglyceridemia.  She is already taking fish oil.  Home Medications    Prior to Admission medications   Medication Sig Start Date End Date Taking? Authorizing Provider  Calcium Carbonate-Vitamin D (CALCIUM-VITAMIN D3 PO) Take 1 tablet by mouth daily. Calcium 600 = D3 1000   Yes [provider]  celecoxib (CELEBREX) 200 MG capsule Take 200 mg by mouth daily.   Yes [provider]  Cholecalciferol (VITAMIN D3) 50 MCG (2000 UT) capsule Take 2,000 Units by mouth daily.   Yes [provider]  cycloSPORINE (RESTASIS) 0.05 % ophthalmic emulsion Place 1 drop into both eyes 2 (two) times daily.    Yes [provider]  digoxin (LANOXIN) 0.125 MG tablet TAKE 1 TABLET BY MOUTH ONCE EVERY MORNING 04/30/20  Yes Wellington Hampshire, MD  dorzolamide-timolol (COSOPT) 22.3-6.8 MG/ML ophthalmic solution Place 1 drop into both eyes 2 (two) times daily.    Yes [provider]  ELIQUIS 5 MG TABS tablet TAKE 1 TABLET BY MOUTH TWICE DAILY 05/19/20  Yes Wellington Hampshire, MD  furosemide (LASIX) 20 MG tablet TAKE 1 TABLET BY MOUTH ONCE DAILY 03/14/20  Yes Wellington Hampshire, MD  latanoprost (XALATAN) 0.005 % ophthalmic solution Place 1 drop into both eyes daily.  03/22/18  Yes [provider]  LYRICA 100 MG capsule TAKE 1 CAPSULE BY MOUTH ONCE DAILY 05/22/20  Yes [name], PA-C  Magnesium 400 MG CAPS Take 400 mg by mouth daily.    Yes [provider]  metoprolol succinate (TOPROL-XL) 25 MG 24 hr tablet TAKE 1/2 TABLET BY MOUTH ONCE DAILY 01/14/20  Yes Burnette, Jennifer M, PA-C  METRONIDAZOLE, TOPICAL, 0.75 % LOTN Apply topically daily. Apply to face in AM   Yes [provider]  Multiple Vitamin (MULTIVITAMIN) tablet Take 1 tablet by mouth daily.   Yes [provider]  NON  FORMULARY Place 12 sprays into both nostrils 6 (six) times daily. 250GU irrigant   Yes [provider]  Omega-3 Fatty Acids (FISH OIL) 1200 MG CAPS Take by mouth daily.    Yes [provider]  omeprazole (PRILOSEC) 40 MG capsule Take 40 mg by mouth every morning.    Yes [provider]  PREMARIN 0.625 MG tablet TAKE 1 TABLET BY MOUTH ONCE DAILY 05/06/20  Yes Burnette, Jennifer M, PA-C  SOOLANTRA 1 % CREA Apply 1 application topically daily. Sometimes uses twice daily as needed for flare ups. 11/20/18  Yes [provider]    Review of Systems    She denies chest pain, palpitations, dyspnea, pnd, orthopnea, n,  v, dizziness, syncope, edema, weight gain, or early satiety.  All other systems reviewed and are otherwise negative except as noted above.  Physical Exam    VS:  BP 128/80 (BP Location: Left Arm, Patient Position: Sitting, Cuff Size: Normal)   Pulse 65   Ht 5\' 8"  (1.727 m)   Wt 185 lb 6 oz (84.1 kg)   SpO2 97%   BMI 28.19 kg/m  , BMI Body mass index is 28.19 kg/m. GEN: Well nourished, well developed, in no acute distress. HEENT: normal. Neck: Supple, no JVD, carotid bruits, or masses. Cardiac: Irregularly irregular, no murmurs, rubs, or gallops. No clubbing, cyanosis, trace bimalleolar edema.  Radials/PT 2+ and equal bilaterally.  Respiratory:  Respirations regular and unlabored, clear to auscultation bilaterally. GI: Soft, nontender, nondistended, BS + x 4. MS: no deformity or atrophy. Skin: warm and dry, no rash. Neuro:  Strength and sensation are intact. Psych: Normal affect.  Accessory Clinical Findings    ECG personally reviewed by me today -atrial fibrillation, 65, inferolateral ST depression - no acute changes.  Lab Results  Component Value Date   WBC 4.6 04/16/2020   HGB 15.0 04/16/2020   HCT 42.6 04/16/2020   MCV 96 04/16/2020   PLT 179 04/16/2020   Lab Results  Component Value Date   CREATININE 0.80 04/16/2020   BUN 16  04/16/2020   NA 140 04/16/2020   K 4.8 04/16/2020   CL 104 04/16/2020   CO2 23 04/16/2020   Lab Results  Component Value Date   ALT 23 04/16/2020   AST 27 04/16/2020   ALKPHOS 54 04/16/2020   BILITOT 0.5 04/16/2020   Lab Results  Component Value Date   CHOL 181 04/16/2020   HDL 35 (L) 04/16/2020   LDLCALC 61 04/16/2020   TRIG 562 (HH) 04/16/2020   CHOLHDL 5.2 (H) 04/16/2020    Lab Results  Component Value Date   HGBA1C 6.1 (H) 04/16/2020    Assessment & Plan    1.  Permanent atrial fibrillation: This is well rate controlled on low-dose beta-blocker and digoxin therapy.  Normal digoxin level in December 2020.  She remains anticoagulated on Eliquis.  Recent CBC and basic metabolic panel were normal.  I will arrange for follow-up digoxin level 1 recheck lipids in about 6 weeks.  2.  Hypertriglyceridemia: Patient has been taking fish oil however triglycerides on most recent fasting evaluation was markedly elevated at 562.  She is interested in trying something else and I have added fenofibrate 145 mg daily.  Follow-up lipids and LFTs in 6 weeks.  3.  Chronic diastolic congestive heart failure: Heart rate and blood pressure well controlled.  Euvolemic on exam.  She continues to take daily low-dose Lasix.  4.  Disposition: Follow-up lipids, LFTs, and digoxin level in about 6 weeks.  Follow-up in clinic in 6 months or sooner if necessary.  Murray Hodgkins, NP 05/30/2020, 4:32 PM

## 2020-06-02 DIAGNOSIS — M5416 Radiculopathy, lumbar region: Secondary | ICD-10-CM | POA: Diagnosis not present

## 2020-06-02 DIAGNOSIS — M533 Sacrococcygeal disorders, not elsewhere classified: Secondary | ICD-10-CM | POA: Diagnosis not present

## 2020-06-02 DIAGNOSIS — M5136 Other intervertebral disc degeneration, lumbar region: Secondary | ICD-10-CM | POA: Diagnosis not present

## 2020-06-04 DIAGNOSIS — H02403 Unspecified ptosis of bilateral eyelids: Secondary | ICD-10-CM | POA: Diagnosis not present

## 2020-06-04 DIAGNOSIS — H16143 Punctate keratitis, bilateral: Secondary | ICD-10-CM | POA: Diagnosis not present

## 2020-06-05 DIAGNOSIS — H02831 Dermatochalasis of right upper eyelid: Secondary | ICD-10-CM | POA: Diagnosis not present

## 2020-06-05 DIAGNOSIS — H02403 Unspecified ptosis of bilateral eyelids: Secondary | ICD-10-CM | POA: Diagnosis not present

## 2020-06-16 ENCOUNTER — Other Ambulatory Visit: Payer: Self-pay | Admitting: Cardiovascular Disease

## 2020-07-01 ENCOUNTER — Other Ambulatory Visit: Payer: Self-pay | Admitting: Cardiovascular Disease

## 2020-07-03 ENCOUNTER — Telehealth: Payer: Self-pay

## 2020-07-03 DIAGNOSIS — I83893 Varicose veins of bilateral lower extremities with other complications: Secondary | ICD-10-CM

## 2020-07-03 NOTE — Telephone Encounter (Signed)
I spoke with the patient and she said that she is not currently having any bleeding.  She was able to stop the bleeding.  Per the patient this is the second time she has had this to happen.  She said the first time was about 2 months ago, she was sitting in her chair and her ankle started "spurting" out blood.   She said this time she was getting out of the shower and her ankle (same spot) started bleeding with some "spurting" of blood.  She said it happened at 10 am and lasted 45 minutes.  She said she did have pressure on it most of that time.  States she has seen a vascular doctor in the past and thinks she needs to go to one again.  She has history of varicose veins but she doesn't know if this bleeding is coming from that or not.      Copied from [place] [id number]. Topic: General - Call Back - No Documentation >> Jul 03, 2020 12:42 PM Erick Blinks wrote: Pt called to report that she has a vein bleeding on her ankle, needs a call back. Declined to speak with triage, wants to know if she needs to be referred elsewhere Best contact: [phone]

## 2020-07-03 NOTE — Telephone Encounter (Signed)
Patient advised. She agrees to see vascular. She would like to see Dr. Lucky Cowboy again. She will be unavailable next week.

## 2020-07-03 NOTE — Addendum Note (Signed)
Addended by: [name] on: 07/03/2020 02:08 PM   Modules accepted: Orders

## 2020-07-03 NOTE — Telephone Encounter (Signed)
I can place the referral for her to the vein clinic

## 2020-07-04 ENCOUNTER — Other Ambulatory Visit: Payer: Self-pay | Admitting: Physician Assistant

## 2020-07-04 DIAGNOSIS — I482 Chronic atrial fibrillation, unspecified: Secondary | ICD-10-CM

## 2020-07-15 DIAGNOSIS — M5136 Other intervertebral disc degeneration, lumbar region: Secondary | ICD-10-CM | POA: Diagnosis not present

## 2020-07-15 DIAGNOSIS — M5416 Radiculopathy, lumbar region: Secondary | ICD-10-CM | POA: Diagnosis not present

## 2020-07-17 ENCOUNTER — Telehealth: Payer: Self-pay

## 2020-07-17 ENCOUNTER — Other Ambulatory Visit: Payer: Self-pay

## 2020-07-17 ENCOUNTER — Other Ambulatory Visit
Admission: RE | Admit: 2020-07-17 | Discharge: 2020-07-17 | Disposition: A | Discharge status: Home / Self Care | Payer: PPO | Attending: Nurse Practitioner | Admitting: Nurse Practitioner

## 2020-07-17 DIAGNOSIS — H02834 Dermatochalasis of left upper eyelid: Secondary | ICD-10-CM | POA: Diagnosis not present

## 2020-07-17 DIAGNOSIS — I4821 Permanent atrial fibrillation: Secondary | ICD-10-CM | POA: Insufficient documentation

## 2020-07-17 DIAGNOSIS — H43811 Vitreous degeneration, right eye: Secondary | ICD-10-CM | POA: Diagnosis not present

## 2020-07-17 DIAGNOSIS — H02831 Dermatochalasis of right upper eyelid: Secondary | ICD-10-CM | POA: Diagnosis not present

## 2020-07-17 LAB — DIGOXIN LEVEL: Digoxin Level: 0.6 ng/mL — ABNORMAL LOW (ref 0.8–2.0)

## 2020-07-17 LAB — COMPREHENSIVE METABOLIC PANEL
ALT: 30 U/L (ref 0–44)
AST: 25 U/L (ref 15–41)
Albumin: 3.8 g/dL (ref 3.5–5.0)
Alkaline Phosphatase: 39 U/L (ref 38–126)
Anion gap: 10 (ref 5–15)
BUN: 26 mg/dL — ABNORMAL HIGH (ref 8–23)
CO2: 26 mmol/L (ref 22–32)
Calcium: 9.1 mg/dL (ref 8.9–10.3)
Chloride: 103 mmol/L (ref 98–111)
Creatinine, Ser: 0.83 mg/dL (ref 0.44–1.00)
GFR calc Af Amer: >60 mL/min (ref >60)
GFR calc non Af Amer: >60 mL/min (ref >60)
Glucose, Bld: 122 mg/dL — ABNORMAL HIGH (ref 70–99)
Potassium: 4.2 mmol/L (ref 3.5–5.1)
Sodium: 139 mmol/L (ref 135–145)
Total Bilirubin: 0.9 mg/dL (ref 0.3–1.2)
Total Protein: 7.4 g/dL (ref 6.5–8.1)

## 2020-07-17 LAB — LIPID PANEL
Cholesterol: 210 mg/dL — ABNORMAL HIGH (ref 0–200)
HDL: 57 mg/dL (ref >40)
LDL Cholesterol: 113 mg/dL — ABNORMAL HIGH (ref 0–99)
Total CHOL/HDL Ratio: 3.7 RATIO
Triglycerides: 199 mg/dL — ABNORMAL HIGH (ref <150)
VLDL: 40 mg/dL (ref 0–40)

## 2020-07-17 NOTE — Telephone Encounter (Signed)
Call to patient to review labs.    Pt verbalized understanding and has no further questions at this time.    Advised pt to call for any further questions or concerns.  No further orders.   

## 2020-07-17 NOTE — Telephone Encounter (Signed)
-----   Message from Rise Mu, PA-C sent at 07/17/2020 12:02 PM EDT ----- Triglycerides are significantly improved, though do remain mildly elevated with a trend from 562 to 199 on fenofibrate LDL 113 Renal and liver function okay Potassium normal Digoxin level normal  She should continue current dose of TriCor and continue to limit sugary foods and drinks in her diet.  Follow-up Teresa Bayley, NP as directed.

## 2020-07-18 ENCOUNTER — Encounter (INDEPENDENT_AMBULATORY_CARE_PROVIDER_SITE_OTHER): Payer: Self-pay | Admitting: Nurse Practitioner

## 2020-07-18 ENCOUNTER — Ambulatory Visit (INDEPENDENT_AMBULATORY_CARE_PROVIDER_SITE_OTHER): Payer: PPO | Admitting: Nurse Practitioner

## 2020-07-18 VITALS — BP 128/74 | HR 60 | Resp 16 | Ht 68.0 in | Wt 188.0 lb

## 2020-07-18 DIAGNOSIS — I8312 Varicose veins of left lower extremity with inflammation: Secondary | ICD-10-CM | POA: Diagnosis not present

## 2020-07-18 DIAGNOSIS — I83891 Varicose veins of right lower extremities with other complications: Secondary | ICD-10-CM | POA: Diagnosis not present

## 2020-07-18 DIAGNOSIS — I8311 Varicose veins of right lower extremity with inflammation: Secondary | ICD-10-CM | POA: Diagnosis not present

## 2020-07-18 DIAGNOSIS — I10 Essential (primary) hypertension: Secondary | ICD-10-CM | POA: Diagnosis not present

## 2020-07-18 DIAGNOSIS — E7800 Pure hypercholesterolemia, unspecified: Secondary | ICD-10-CM | POA: Diagnosis not present

## 2020-07-18 NOTE — Progress Notes (Signed)
Subjective:    Patient ID: Teresa Harris, female    DOB: 01-26-1941, 79 y.o.   MRN: [id number] Chief Complaint  Patient presents with  . New Patient (Initial Visit)    ref [place] varicose veins w/spontaneous bleeding    The patient presents today as referral from [facility], [place] due to varicose veins with spontaneous bleeding.  Patient notes that she has some spontaneous bleeding from her right ankle area.  She was able to do that to stop with holding pressure.  However the spontaneous bleeding was understandably concerning for her.  She denies shaving her legs or any injury to the area prior to bleeding.  Previously, her office treated the patient in 2012, however the patient is not entirely certain if there was an endovenous ablation done for sclerotherapy versus both.  I was unable to locate records from her previous visits in 2012 due to a new  Medical record system.  Patient denies any persistent ulceration.  She denies any fever, chills or signs symptoms of cellulitis.   Review of Systems  Cardiovascular:       Scattered varicosities  Hematological: Bruises/bleeds easily.       Objective:   Physical Exam Vitals reviewed.  HENT:     Head: Normocephalic.  Cardiovascular:     Rate and Rhythm: Normal rate.     Pulses: Normal pulses.  Pulmonary:     Effort: Pulmonary effort is normal.  Musculoskeletal:        General: Signs of injury present.  Skin:    General: Skin is warm and dry.  Neurological:     Mental Status: She is alert and oriented to person, place, and time.  Psychiatric:        Mood and Affect: Mood normal.        Behavior: Behavior normal.        Thought Content: Thought content normal.        Judgment: Judgment normal.     BP 128/74 (BP Location: Right Arm)   Pulse 60   Resp 16   Ht 5\' 8"  (1.727 m)   Wt 188 lb (85.3 kg)   BMI 28.59 kg/m   Past Medical History:  Diagnosis Date  . (HFpEF) heart failure with preserved ejection fraction  ([place])    a. 12/2018 Echo: EF 60-65%, no rwma, mild MR. Mildly dil LA. Nl RV fxn. PASP 16mmHg.  . Arthritis   . Complication of anesthesia    trouble waking up with Propofol  . Depression    h/o  . Family history of adverse reaction to anesthesia    daughter PONV  . Glaucoma    left eye  . Headache    H/O MIGRAINES  . Hemorrhoids   . Hiatal hernia   . History of kidney stones   . History of migraine headaches   . History of stress test    a. 10/2014 MV: No ischemia.  Marland Kitchen Hx MRSA infection   . Hypercholesteremia   . Osteopenia   . Permanent atrial fibrillation ([place]) 10/01/2014   a. CHA2DS2VASc = 3-->eliquis.  Marland Kitchen PONV (postoperative nausea and vomiting)     Social History   Socioeconomic History  . Marital status: Divorced    Spouse name: Not on file  . Number of children: 2  . Years of education: Not on file  . Highest education level: Some college, no degree  Occupational History  . Occupation: retired  Tobacco Use  . Smoking status: Former Smoker  Packs/day: 1.00    Years: 30.00    Pack years: 30.00    Types: Cigarettes    Quit date: 02/09/1996    Years since quitting: 24.4  . Smokeless tobacco: Never Used  Vaping Use  . Vaping Use: Never used  Substance and Sexual Activity  . Alcohol use: Yes    Comment: maybe twice a year- 1 mixed drink  . Drug use: No  . Sexual activity: Not on file  Other Topics Concern  . Not on file  Social History Narrative  . Not on file   Social Determinants of Health   Financial Resource Strain: Low Risk   . Difficulty of Paying Living Expenses: Not hard at all  Food Insecurity: No Food Insecurity  . Worried About Charity fundraiser in the Last Year: Never true  . Ran Out of Food in the Last Year: Never true  Transportation Needs: No Transportation Needs  . Lack of Transportation (Medical): No  . Lack of Transportation (Non-Medical): No  Physical Activity: Inactive  . Days of Exercise per Week: 0 days  . Minutes of Exercise  per Session: 0 min  Stress: No Stress Concern Present  . Feeling of Stress : Only a little  Social Connections: Socially Isolated  . Frequency of Communication with Friends and Family: More than three times a week  . Frequency of Social Gatherings with Friends and Family: More than three times a week  . Attends Religious Services: Never  . Active Member of Clubs or Organizations: No  . Attends Archivist Meetings: Never  . Marital Status: Divorced  Human resources officer Violence: Not At Risk  . Fear of Current or Ex-Partner: No  . Emotionally Abused: No  . Physically Abused: No  . Sexually Abused: No    Past Surgical History:  Procedure Laterality Date  . ABDOMINAL HYSTERECTOMY    . AUGMENTATION MAMMAPLASTY Bilateral 2004  . CARPAL TUNNEL RELEASE Right 02/16/2016   Procedure: CARPAL TUNNEL RELEASE;  Surgeon: Dereck Leep, MD;  Location: [facility] ORS;  Service: Orthopedics;  Laterality: Right;  . CARPAL TUNNEL RELEASE Left 04/12/2016   Procedure: CARPAL TUNNEL RELEASE;  Surgeon: Dereck Leep, MD;  Location: [facility] ORS;  Service: Orthopedics;  Laterality: Left;  . CARPAL TUNNEL RELEASE Right 03/13/2018   Procedure: CARPAL TUNNEL RELEASE;  Surgeon: Deetta Perla, MD;  Location: [facility] ORS;  Service: Neurosurgery;  Laterality: Right;  . CATARACT EXTRACTION, BILATERAL    . COLONOSCOPY  12/29/09   Dr Dionne Milo  . EYE SURGERY Left    shunt placed  . EYE SURGERY Left    tube placed  . FOOT SURGERY     bilateral   . JOINT REPLACEMENT    . NASAL SEPTUM SURGERY    . REPLACEMENT TOTAL KNEE     bilateral  . SHOULDER SURGERY     right shoulder    Family History  Problem Relation Age of Onset  . Arthritis Mother   . Arthritis Father   . Breast cancer Neg Hx     Allergies  Allergen Reactions  . [name] [Moxifloxacin Hcl In Nacl] Other (See Comments)    Headache  . Bimatoprost Other (See Comments)    Made my eyes red, and hurt Made my eyes red, and hurt Made my eyes red, and  hurt Made my eyes red, and hurt  . Brimonidine Other (See Comments)    Made my eyes red, and hurt Made my eyes red, and hurt Burned eyes.  Marland Kitchen  Mobic [Meloxicam] Other (See Comments)    headache  . Propofol Other (See Comments)    Had difficulty waking up Had difficulty waking up Difficult to wake up.  . Travoprost Other (See Comments)    Unknown  . Vicodin [Hydrocodone-Acetaminophen] Nausea And Vomiting and Other (See Comments)    Sick on stomach  . Cephalexin Other (See Comments) and Nausea And Vomiting    headache headache headache       Assessment & Plan:   1. Hemorrhage of varicose veins of right lower extremity Schlerotherapy was performed at the ankle level to try to prevent further recurrence of bleeding until we can fully work the patient up to determine the next options.  2. Varicose veins of both lower extremities with inflammation We will have the patient return at her convenience for noninvasive studies related to varicose veins.  The patient has had treatment before however she is able to know exactly which treatment was done, also with the development of new symptoms it is important to note that even the patient had an endovenous laser ablation if she has had reconstitution of her great saphenous vein. - VAS [place] LOWER EXTREMITY VENOUS REFLUX; Future  3. Pure hypercholesterolemia Continue statin as ordered and reviewed, no changes at this time   4. Essential hypertension Continue antihypertensive medications as already ordered, these medications have been reviewed and there are no changes at this time.    Current Outpatient Medications on File Prior to Visit  Medication Sig Dispense Refill  . Calcium Carbonate-Vitamin D (CALCIUM-VITAMIN D3 PO) Take 1 tablet by mouth daily. Calcium 600 = D3 1000    . celecoxib (CELEBREX) 200 MG capsule Take 200 mg by mouth daily.    . Cholecalciferol (VITAMIN D3) 50 MCG (2000 UT) capsule Take 2,000 Units by mouth daily.    .  cycloSPORINE (RESTASIS) 0.05 % ophthalmic emulsion Place 1 drop into both eyes 2 (two) times daily.     . digoxin (LANOXIN) 0.125 MG tablet TAKE 1 TABLET BY MOUTH ONCE EVERY MORNING 30 tablet 5  . dorzolamide-timolol (COSOPT) 22.3-6.8 MG/ML ophthalmic solution Place 1 drop into both eyes 2 (two) times daily.     Marland Kitchen ELIQUIS 5 MG TABS tablet TAKE 1 TABLET BY MOUTH TWICE DAILY 180 tablet 1  . fenofibrate (TRICOR) 145 MG tablet Take 1 tablet (145 mg total) by mouth daily. 90 tablet 3  . furosemide (LASIX) 20 MG tablet TAKE 1 TABLET BY MOUTH ONCE DAILY 90 tablet 1  . latanoprost (XALATAN) 0.005 % ophthalmic solution Place 1 drop into both eyes daily.     Marland Kitchen LYRICA 100 MG capsule TAKE 1 CAPSULE BY MOUTH ONCE DAILY 90 capsule 1  . Magnesium 400 MG CAPS Take 400 mg by mouth daily.     . metoprolol succinate (TOPROL-XL) 25 MG 24 hr tablet TAKE 1/2 TABLET BY MOUTH ONCE DAILY 90 tablet 0  . METRONIDAZOLE, TOPICAL, 0.75 % LOTN Apply topically daily. Apply to face in AM    . Multiple Vitamin (MULTIVITAMIN) tablet Take 1 tablet by mouth daily.    . NON FORMULARY Place 12 sprays into both nostrils 6 (six) times daily. 250GU irrigant    . Omega-3 Fatty Acids (FISH OIL) 1200 MG CAPS Take by mouth daily.     Marland Kitchen omeprazole (PRILOSEC) 40 MG capsule Take 40 mg by mouth every morning.     Marland Kitchen PREMARIN 0.625 MG tablet TAKE 1 TABLET BY MOUTH ONCE DAILY 90 tablet 3  . SOOLANTRA 1 %  CREA Apply 1 application topically daily. Sometimes uses twice daily as needed for flare ups.     No current facility-administered medications on file prior to visit.    There are no Patient Instructions on file for this visit. No follow-ups on file.   Kris Hartmann, NP

## 2020-07-30 ENCOUNTER — Other Ambulatory Visit: Payer: Self-pay

## 2020-07-30 ENCOUNTER — Ambulatory Visit (INDEPENDENT_AMBULATORY_CARE_PROVIDER_SITE_OTHER): Payer: PPO

## 2020-07-30 ENCOUNTER — Ambulatory Visit (INDEPENDENT_AMBULATORY_CARE_PROVIDER_SITE_OTHER): Payer: PPO | Admitting: Nurse Practitioner

## 2020-07-30 ENCOUNTER — Encounter (INDEPENDENT_AMBULATORY_CARE_PROVIDER_SITE_OTHER): Payer: Self-pay | Admitting: Nurse Practitioner

## 2020-07-30 VITALS — BP 132/70 | HR 77 | Resp 16 | Wt 187.8 lb

## 2020-07-30 DIAGNOSIS — I8311 Varicose veins of right lower extremity with inflammation: Secondary | ICD-10-CM

## 2020-07-30 DIAGNOSIS — I8312 Varicose veins of left lower extremity with inflammation: Secondary | ICD-10-CM

## 2020-07-30 DIAGNOSIS — I83893 Varicose veins of bilateral lower extremities with other complications: Secondary | ICD-10-CM | POA: Diagnosis not present

## 2020-07-30 DIAGNOSIS — E7800 Pure hypercholesterolemia, unspecified: Secondary | ICD-10-CM | POA: Diagnosis not present

## 2020-07-30 DIAGNOSIS — I10 Essential (primary) hypertension: Secondary | ICD-10-CM | POA: Diagnosis not present

## 2020-07-31 ENCOUNTER — Encounter (INDEPENDENT_AMBULATORY_CARE_PROVIDER_SITE_OTHER): Payer: Self-pay | Admitting: Nurse Practitioner

## 2020-07-31 NOTE — Progress Notes (Signed)
Subjective:    Patient ID: Teresa Harris, female    DOB: [birth year], 79 y.o.   MRN: [id number] Chief Complaint  Patient presents with  . Follow-up    ultrasound follow up    The patient presents today for follow-up studies regarding spontaneous varicose vein bleeding.  The patient most recently has had some spontaneous bleeding from her right ankle area.  The patient notes having a previous history of treatment for varicose veins however she was unsure of what treatment was done.  Noninvasive studies today reveal that the patient had an endovenous laser ablation of her bilateral great saphenous veins.  Since the patient's last office visit she has not had any bleeding varicosities.  The patient denies any ulceration or superficial thrombophlebitis.  She denies any fever, chills or signs or symptoms of cellulitis.  The patient continues to utilize conservative therapy including medical grade 1 compression stockings as well as elevation and exercise.  However the patient's greatest concern is another bleeding event that she may not be able to stop.  Today noninvasive studies show patent previous great saphenous vein ablations.  There is no evidence of DVT or superficial venous thrombosis bilaterally.  No evidence of deep venous insufficiency.  There are noted superficial branches within the bilateral great saphenous vein at the proximal/mid thigh.      Review of Systems  Cardiovascular: Positive for leg swelling.  Hematological: Bruises/bleeds easily.  All other systems reviewed and are negative.      Objective:   Physical Exam Vitals reviewed.  HENT:     Head: Normocephalic.  Cardiovascular:     Rate and Rhythm: Normal rate.     Pulses: Normal pulses.  Pulmonary:     Effort: Pulmonary effort is normal.  Skin:    General: Skin is warm and dry.     Capillary Refill: Capillary refill takes less than 2 seconds.     Comments: Extensive varicosities near the bilateral ankle areas   Neurological:     Mental Status: She is alert and oriented to person, place, and time.  Psychiatric:        Mood and Affect: Mood normal.        Behavior: Behavior normal.        Thought Content: Thought content normal.        Judgment: Judgment normal.     BP 132/70 (BP Location: Right Arm)   Pulse 77   Resp 16   Wt 187 lb 12.8 oz (85.2 kg)   BMI 28.55 kg/m   Past Medical History:  Diagnosis Date  . (HFpEF) heart failure with preserved ejection fraction ([place])    a. 12/2018 Echo: EF 60-65%, no rwma, mild MR. Mildly dil LA. Nl RV fxn. PASP 55mmHg.  . Arthritis   . Complication of anesthesia    trouble waking up with Propofol  . Depression    h/o  . Family history of adverse reaction to anesthesia    daughter PONV  . Glaucoma    left eye  . Headache    H/O MIGRAINES  . Hemorrhoids   . Hiatal hernia   . History of kidney stones   . History of migraine headaches   . History of stress test    a. 10/2014 MV: No ischemia.  Marland Kitchen Hx MRSA infection   . Hypercholesteremia   . Osteopenia   . Permanent atrial fibrillation ([place]) 10/01/2014   a. CHA2DS2VASc = 3-->eliquis.  Marland Kitchen PONV (postoperative nausea and vomiting)  Social History   Socioeconomic History  . Marital status: Divorced    Spouse name: Not on file  . Number of children: 2  . Years of education: Not on file  . Highest education level: Some college, no degree  Occupational History  . Occupation: retired  Tobacco Use  . Smoking status: Former Smoker    Packs/day: 1.00    Years: 30.00    Pack years: 30.00    Types: Cigarettes    Quit date: 02/09/1996    Years since quitting: 24.4  . Smokeless tobacco: Never Used  Vaping Use  . Vaping Use: Never used  Substance and Sexual Activity  . Alcohol use: Yes    Comment: maybe twice a year- 1 mixed drink  . Drug use: No  . Sexual activity: Not on file  Other Topics Concern  . Not on file  Social History Narrative  . Not on file   Social Determinants of  Health   Financial Resource Strain: Low Risk   . Difficulty of Paying Living Expenses: Not hard at all  Food Insecurity: No Food Insecurity  . Worried About Charity fundraiser in the Last Year: Never true  . Ran Out of Food in the Last Year: Never true  Transportation Needs: No Transportation Needs  . Lack of Transportation (Medical): No  . Lack of Transportation (Non-Medical): No  Physical Activity: Inactive  . Days of Exercise per Week: 0 days  . Minutes of Exercise per Session: 0 min  Stress: No Stress Concern Present  . Feeling of Stress : Only a little  Social Connections: Socially Isolated  . Frequency of Communication with Friends and Family: More than three times a week  . Frequency of Social Gatherings with Friends and Family: More than three times a week  . Attends Religious Services: Never  . Active Member of Clubs or Organizations: No  . Attends Archivist Meetings: Never  . Marital Status: Divorced  Human resources officer Violence: Not At Risk  . Fear of Current or Ex-Partner: No  . Emotionally Abused: No  . Physically Abused: No  . Sexually Abused: No    Past Surgical History:  Procedure Laterality Date  . ABDOMINAL HYSTERECTOMY    . AUGMENTATION MAMMAPLASTY Bilateral 2004  . CARPAL TUNNEL RELEASE Right 02/16/2016   Procedure: CARPAL TUNNEL RELEASE;  Surgeon: Dereck Leep, MD;  Location: [facility] OR[facility];  Service: Orthopedics;  Laterality: Right;  . CARPAL TUNNEL RELEASE Left 04/12/2016   Procedure: CARPAL TUNNEL RELEASE;  Surgeon: Dereck Leep, MD;  Location: [facility] OR[facility];  Service: Orthopedics;  Laterality: Left;  . CARPAL TUNNEL RELEASE Right 03/13/2018   Procedure: CARPAL TUNNEL RELEASE;  Surgeon: Deetta Perla, MD;  Location: [facility] ORS;  Service: Neurosurgery;  Laterality: Right;  . CATARACT EXTRACTION, BILATERAL    . COLONOSCOPY  12/29/09   Dr Dionne Milo  . EYE SURGERY Left    shunt placed  . EYE SURGERY Left    tube placed  . FOOT SURGERY     bilateral   .  JOINT REPLACEMENT    . NASAL SEPTUM SURGERY    . REPLACEMENT TOTAL KNEE     bilateral  . SHOULDER SURGERY     right shoulder    Family History  Problem Relation Age of Onset  . Arthritis Mother   . Arthritis Father   . Breast cancer Neg Hx     Allergies  Allergen Reactions  . [name] [Moxifloxacin Hcl In Nacl] Other (See Comments)  Headache  . Bimatoprost Other (See Comments)    Made my eyes red, and hurt Made my eyes red, and hurt Made my eyes red, and hurt Made my eyes red, and hurt  . Brimonidine Other (See Comments)    Made my eyes red, and hurt Made my eyes red, and hurt Burned eyes.  . Mobic [Meloxicam] Other (See Comments)    headache  . Propofol Other (See Comments)    Had difficulty waking up Had difficulty waking up Difficult to wake up.  . Travoprost Other (See Comments)    Unknown  . Vicodin [Hydrocodone-Acetaminophen] Nausea And Vomiting and Other (See Comments)    Sick on stomach  . Cephalexin Other (See Comments) and Nausea And Vomiting    headache headache headache       Assessment & Plan:   1. Bleeding from varicose veins of lower extremity, bilateral Recommend:  The patient has had successful ablation of the previously incompetent saphenous venous system but still has persistent symptoms of spontaneous bleeding and hemorrhage in addition to pain and swelling that are causing negative effect on the patient's life.  Patient should undergo injection sclerotherapy to treat the residual varicosities.  The risks, benefits and alternative therapies were reviewed in detail with the patient.  All questions were answered.  The patient agrees to proceed with sclerotherapy at their convenience.  The patient will continue wearing the graduated compression stockings and using the over-the-counter pain medications to treat her symptoms.       2. Essential hypertension Continue antihypertensive medications as already ordered, these medications have  been reviewed and there are no changes at this time.   3. Pure hypercholesterolemia Continue statin as ordered and reviewed, no changes at this time    Current Outpatient Medications on File Prior to Visit  Medication Sig Dispense Refill  . Calcium Carbonate-Vitamin D (CALCIUM-VITAMIN D3 PO) Take 1 tablet by mouth daily. Calcium 600 = D3 1000    . celecoxib (CELEBREX) 200 MG capsule Take 200 mg by mouth daily.    . Cholecalciferol (VITAMIN D3) 50 MCG (2000 UT) capsule Take 2,000 Units by mouth daily.    . cycloSPORINE (RESTASIS) 0.05 % ophthalmic emulsion Place 1 drop into both eyes 2 (two) times daily.     . digoxin (LANOXIN) 0.125 MG tablet TAKE 1 TABLET BY MOUTH ONCE EVERY MORNING 30 tablet 5  . dorzolamide-timolol (COSOPT) 22.3-6.8 MG/ML ophthalmic solution Place 1 drop into both eyes 2 (two) times daily.     Marland Kitchen ELIQUIS 5 MG TABS tablet TAKE 1 TABLET BY MOUTH TWICE DAILY 180 tablet 1  . fenofibrate (TRICOR) 145 MG tablet Take 1 tablet (145 mg total) by mouth daily. 90 tablet 3  . furosemide (LASIX) 20 MG tablet TAKE 1 TABLET BY MOUTH ONCE DAILY 90 tablet 1  . latanoprost (XALATAN) 0.005 % ophthalmic solution Place 1 drop into both eyes daily.     Marland Kitchen LYRICA 100 MG capsule TAKE 1 CAPSULE BY MOUTH ONCE DAILY 90 capsule 1  . Magnesium 400 MG CAPS Take 400 mg by mouth daily.     . metoprolol succinate (TOPROL-XL) 25 MG 24 hr tablet TAKE 1/2 TABLET BY MOUTH ONCE DAILY 90 tablet 0  . METRONIDAZOLE, TOPICAL, 0.75 % LOTN Apply topically daily. Apply to face in AM    . Multiple Vitamin (MULTIVITAMIN) tablet Take 1 tablet by mouth daily.    . NON FORMULARY Place 12 sprays into both nostrils 6 (six) times daily. 250GU irrigant    .  Omega-3 Fatty Acids (FISH OIL) 1200 MG CAPS Take by mouth daily.     Marland Kitchen omeprazole (PRILOSEC) 40 MG capsule Take 40 mg by mouth every morning.     Marland Kitchen PREMARIN 0.625 MG tablet TAKE 1 TABLET BY MOUTH ONCE DAILY 90 tablet 3  . SOOLANTRA 1 % CREA Apply 1 application topically  daily. Sometimes uses twice daily as needed for flare ups.     No current facility-administered medications on file prior to visit.    There are no Patient Instructions on file for this visit. No follow-ups on file.   Kris Hartmann, NP

## 2020-08-08 DIAGNOSIS — H401111 Primary open-angle glaucoma, right eye, mild stage: Secondary | ICD-10-CM | POA: Diagnosis not present

## 2020-08-19 ENCOUNTER — Other Ambulatory Visit: Payer: Self-pay | Admitting: Physician Assistant

## 2020-08-19 DIAGNOSIS — G629 Polyneuropathy, unspecified: Secondary | ICD-10-CM

## 2020-08-19 DIAGNOSIS — G2581 Restless legs syndrome: Secondary | ICD-10-CM

## 2020-08-19 NOTE — Telephone Encounter (Signed)
Requested medication (s) are due for refill today:yes  Requested medication (s) are on the active medication list: yes  Last refill:  05/22/20  #90--0 refills  Future visit scheduled:yes  Notes to clinic:  Not delegated    Requested Prescriptions  Pending Prescriptions Disp Refills   LYRICA 100 MG capsule [Pharmacy Med Name: LYRICA 100 MG CAP] 90 capsule     Sig: TAKE 1 CAPSULE BY MOUTH ONCE DAILY      Not Delegated - Neurology:  Anticonvulsants - Controlled Failed - 08/19/2020 11:12 AM      Failed - This refill cannot be delegated      Passed - Valid encounter within last 12 months    Recent Outpatient Visits           4 months ago Annual physical exam   [place], PA-C   7 months ago Adverse effect of vaccine, initial encounter   [place], [place]   7 months ago Acute non-recurrent pansinusitis   [facility] [place], Clearnce Sorrel, [place]   1 year ago Annual physical exam   [facility] [place] M, [place]   2 years ago Rash and nonspecific skin eruption   [facility] [place], Clearnce Sorrel, [place]       Future Appointments             In 3 months Sharolyn Douglas, Clance Boll, NP [employer], [place]   In 7 months Marlyn Corporal, Clearnce Sorrel, PA-C [employer], [place]

## 2020-09-03 ENCOUNTER — Encounter (INDEPENDENT_AMBULATORY_CARE_PROVIDER_SITE_OTHER): Payer: Self-pay | Admitting: Vascular Surgery

## 2020-09-03 ENCOUNTER — Other Ambulatory Visit: Payer: Self-pay

## 2020-09-03 ENCOUNTER — Ambulatory Visit (INDEPENDENT_AMBULATORY_CARE_PROVIDER_SITE_OTHER): Payer: PPO | Admitting: Vascular Surgery

## 2020-09-03 DIAGNOSIS — I83891 Varicose veins of right lower extremities with other complications: Secondary | ICD-10-CM | POA: Diagnosis not present

## 2020-09-03 NOTE — Progress Notes (Signed)
Varicose veins of bilateral  lower extremity with inflammation (454.1  I83.10) Current Plans   Indication: Patient presents with symptomatic varicose veins of the bilateral  lower extremity.   Procedure: Sclerotherapy using hypertonic saline mixed with 1% Lidocaine was performed on the bilateral lower extremity. Compression wraps were placed. The patient tolerated the procedure well. 

## 2020-09-12 DIAGNOSIS — H401111 Primary open-angle glaucoma, right eye, mild stage: Secondary | ICD-10-CM | POA: Diagnosis not present

## 2020-09-17 ENCOUNTER — Other Ambulatory Visit: Payer: Self-pay | Admitting: Cardiovascular Disease

## 2020-09-22 ENCOUNTER — Ambulatory Visit: Payer: PPO | Attending: Internal Medicine

## 2020-09-22 DIAGNOSIS — Z23 Encounter for immunization: Secondary | ICD-10-CM

## 2020-09-22 NOTE — Progress Notes (Signed)
   Covid-19 Vaccination Clinic  Name:  Teresa Harris    MRN: [id number] DOB: 11-07-1941  09/22/2020  [name] was observed post Covid-19 immunization for 15 minutes without incident. She was provided with Vaccine Information Sheet and instruction to access the V-Safe system.   [name] was instructed to call 911 with any severe reactions post vaccine: Marland Kitchen Difficulty breathing  . Swelling of face and throat  . A fast heartbeat  . A bad rash all over body  . Dizziness and weakness

## 2020-09-24 ENCOUNTER — Other Ambulatory Visit: Payer: Self-pay | Admitting: Physician Assistant

## 2020-09-24 DIAGNOSIS — Z1231 Encounter for screening mammogram for malignant neoplasm of breast: Secondary | ICD-10-CM

## 2020-09-29 ENCOUNTER — Other Ambulatory Visit: Payer: Self-pay | Admitting: Physician Assistant

## 2020-09-29 NOTE — Telephone Encounter (Signed)
Celecoxib 200 mg caps have been provided by historical provider.   Current provider will need to send a new prescription to pharmacy.   LOV  03/31/00  Next OV 04/03/21.

## 2020-09-30 DIAGNOSIS — L918 Other hypertrophic disorders of the skin: Secondary | ICD-10-CM | POA: Diagnosis not present

## 2020-09-30 DIAGNOSIS — Z872 Personal history of diseases of the skin and subcutaneous tissue: Secondary | ICD-10-CM | POA: Diagnosis not present

## 2020-09-30 DIAGNOSIS — L570 Actinic keratosis: Secondary | ICD-10-CM | POA: Diagnosis not present

## 2020-09-30 DIAGNOSIS — L821 Other seborrheic keratosis: Secondary | ICD-10-CM | POA: Diagnosis not present

## 2020-09-30 DIAGNOSIS — L718 Other rosacea: Secondary | ICD-10-CM | POA: Diagnosis not present

## 2020-09-30 DIAGNOSIS — L578 Other skin changes due to chronic exposure to nonionizing radiation: Secondary | ICD-10-CM | POA: Diagnosis not present

## 2020-09-30 DIAGNOSIS — Z86018 Personal history of other benign neoplasm: Secondary | ICD-10-CM | POA: Diagnosis not present

## 2020-10-01 ENCOUNTER — Ambulatory Visit (INDEPENDENT_AMBULATORY_CARE_PROVIDER_SITE_OTHER): Payer: PPO | Admitting: Vascular Surgery

## 2020-10-07 ENCOUNTER — Other Ambulatory Visit: Payer: Self-pay | Admitting: Physician Assistant

## 2020-10-07 DIAGNOSIS — I482 Chronic atrial fibrillation, unspecified: Secondary | ICD-10-CM

## 2020-10-08 ENCOUNTER — Other Ambulatory Visit: Payer: Self-pay

## 2020-10-08 ENCOUNTER — Ambulatory Visit (INDEPENDENT_AMBULATORY_CARE_PROVIDER_SITE_OTHER): Payer: PPO

## 2020-10-08 DIAGNOSIS — Z23 Encounter for immunization: Secondary | ICD-10-CM | POA: Diagnosis not present

## 2020-10-13 ENCOUNTER — Other Ambulatory Visit: Payer: Self-pay | Admitting: Cardiovascular Disease

## 2020-10-13 NOTE — Telephone Encounter (Signed)
Please review for refill.  

## 2020-10-13 NOTE — Telephone Encounter (Signed)
Pt's age 79, wt 85.7 kg, SCr 0.83, CrCl 75.58, last ov w/ CB 05/30/20.

## 2020-10-17 DIAGNOSIS — H401111 Primary open-angle glaucoma, right eye, mild stage: Secondary | ICD-10-CM | POA: Diagnosis not present

## 2020-10-22 ENCOUNTER — Other Ambulatory Visit: Payer: Self-pay

## 2020-10-22 ENCOUNTER — Ambulatory Visit (INDEPENDENT_AMBULATORY_CARE_PROVIDER_SITE_OTHER): Payer: PPO | Admitting: Vascular Surgery

## 2020-10-22 ENCOUNTER — Encounter (INDEPENDENT_AMBULATORY_CARE_PROVIDER_SITE_OTHER): Payer: Self-pay | Admitting: Vascular Surgery

## 2020-10-22 VITALS — BP 143/88 | HR 64 | Ht 68.0 in | Wt 184.0 lb

## 2020-10-22 DIAGNOSIS — I83891 Varicose veins of right lower extremities with other complications: Secondary | ICD-10-CM

## 2020-10-22 NOTE — Progress Notes (Signed)
Varicose veins of bilateral  lower extremity with inflammation (454.1  I83.10) Current Plans   Indication: Patient presents with symptomatic varicose veins of the bilateral  lower extremity.   Procedure: Sclerotherapy using hypertonic saline mixed with 1% Lidocaine was performed on the bilateral lower extremity. Compression wraps were placed. The patient tolerated the procedure well. 

## 2020-11-10 ENCOUNTER — Telehealth: Payer: Self-pay | Admitting: *Deleted

## 2020-11-10 NOTE — Chronic Care Management (AMB) (Signed)
  Chronic Care Management   Note  11/10/2020 Name: Teresa Harris MRN: [id number] DOB: Sep 06, 1941  Teresa Harris is a 79 y.o. year old female who is a primary care patient of [facility]. I reached out to [place] by phone today in response to a referral sent by Teresa Harris's health plan.     Teresa Harris was given information about Chronic Care Management services today including:  1. CCM service includes personalized support from designated clinical staff supervised by her physician, including individualized plan of care and coordination with other care providers 2. 24/7 contact phone numbers for assistance for urgent and routine care needs. 3. Service will only be billed when office clinical staff spend 20 minutes or more in a month to coordinate care. 4. Only one practitioner may furnish and bill the service in a calendar month. 5. The patient may stop CCM services at any time (effective at the end of the month) by phone call to the office staff. 6. The patient will be responsible for cost sharing (co-pay) of up to 20% of the service fee (after annual deductible is met).  Patient agreed to services and verbal consent obtained.   Follow up plan: Telephone appointment with care management team member scheduled for: 12/09/2020  [place] [facility]

## 2020-11-11 ENCOUNTER — Other Ambulatory Visit: Payer: Self-pay

## 2020-11-11 ENCOUNTER — Other Ambulatory Visit: Payer: Self-pay | Admitting: Physician Assistant

## 2020-11-11 ENCOUNTER — Ambulatory Visit
Admission: RE | Admit: 2020-11-11 | Discharge: 2020-11-11 | Disposition: A | Discharge status: Home / Self Care | Payer: PPO | Source: Ambulatory Visit | Attending: Physician Assistant | Admitting: Physician Assistant

## 2020-11-11 DIAGNOSIS — Z1231 Encounter for screening mammogram for malignant neoplasm of breast: Secondary | ICD-10-CM

## 2020-11-25 ENCOUNTER — Ambulatory Visit: Payer: PPO | Admitting: Nurse Practitioner

## 2020-11-26 ENCOUNTER — Other Ambulatory Visit: Payer: Self-pay

## 2020-11-26 ENCOUNTER — Ambulatory Visit: Payer: PPO | Admitting: Nurse Practitioner

## 2020-11-26 ENCOUNTER — Encounter: Payer: Self-pay | Admitting: Nurse Practitioner

## 2020-11-26 VITALS — BP 120/80 | HR 62 | Ht 68.0 in | Wt 189.0 lb

## 2020-11-26 DIAGNOSIS — I5032 Chronic diastolic (congestive) heart failure: Secondary | ICD-10-CM | POA: Diagnosis not present

## 2020-11-26 DIAGNOSIS — E781 Pure hyperglyceridemia: Secondary | ICD-10-CM | POA: Diagnosis not present

## 2020-11-26 DIAGNOSIS — I4821 Permanent atrial fibrillation: Secondary | ICD-10-CM

## 2020-11-26 NOTE — Patient Instructions (Signed)
Medication Instructions:  No changes  *If you need a refill on your cardiac medications before your next appointment, please call your pharmacy*   Lab Work: BMET, and CBC to be done today  If you have labs (blood work) drawn today and your tests are completely normal, you will receive your results only by: Marland Kitchen MyChart Message (if you have MyChart) OR . A paper copy in the mail If you have any lab test that is abnormal or we need to change your treatment, we will call you to review the results.   Testing/Procedures: None   Follow-Up: At [facility], you and your health needs are our priority.  As part of our continuing mission to provide you with exceptional heart care, we have created designated Provider Care Teams.  These Care Teams include your primary Cardiologist (physician) and Advanced Practice Providers (APPs -  Physician Assistants and Nurse Practitioners) who all work together to provide you with the care you need, when you need it.   Your next appointment:   6 month(s)  The format for your next appointment:   In Person  Provider:   Kathlyn Sacramento, MD or Murray Hodgkins, NP

## 2020-11-26 NOTE — Progress Notes (Signed)
Office Visit    Patient Name: Teresa Harris Date of Encounter: 11/26/2020  Primary Care Provider:  Margaretann Loveless, PA-C Primary Cardiologist:  Lorine Bears, MD  Chief Complaint    79 year old female with a history of permanent atrial fibrillation, borderline diabetes, hyperlipidemia, and diastolic heart failure, who presents for follow-up of atrial fibrillation.  Past Medical History    Past Medical History:  Diagnosis Date  . (HFpEF) heart failure with preserved ejection fraction (HCC)    a. 12/2018 Echo: EF 60-65%, no rwma, mild MR. Mildly dil LA. Nl RV fxn. PASP .  . Arthritis   . Complication of anesthesia    trouble waking up with Propofol  . Depression    h/o  . Family history of adverse reaction to anesthesia    daughter PONV  . Glaucoma    left eye  . Headache    H/O MIGRAINES  . Hemorrhoids   . Hiatal hernia   . History of kidney stones   . History of migraine headaches   . History of stress test    a. 10/2014 MV: No ischemia.  Marland Kitchen Hx MRSA infection   . Hypercholesteremia   . Osteopenia   . Permanent atrial fibrillation (HCC) 10/01/2014   a. CHA2DS2VASc = 3-->eliquis.  Marland Kitchen PONV (postoperative nausea and vomiting)    Past Surgical History:  Procedure Laterality Date  . ABDOMINAL HYSTERECTOMY    . AUGMENTATION MAMMAPLASTY Bilateral 2004  . CARPAL TUNNEL RELEASE Right 02/16/2016   Procedure: CARPAL TUNNEL RELEASE;  Surgeon: Donato Heinz, MD;  Location: [facility] ORS;  Service: Orthopedics;  Laterality: Right;  . CARPAL TUNNEL RELEASE Left 04/12/2016   Procedure: CARPAL TUNNEL RELEASE;  Surgeon: Donato Heinz, MD;  Location: [facility] ORS;  Service: Orthopedics;  Laterality: Left;  . CARPAL TUNNEL RELEASE Right 03/13/2018   Procedure: CARPAL TUNNEL RELEASE;  Surgeon: Lucy Chris, MD;  Location: [facility] ORS;  Service: Neurosurgery;  Laterality: Right;  . CATARACT EXTRACTION, BILATERAL    . COLONOSCOPY  12/29/09   Dr Niel Hummer  . EYE SURGERY Left     shunt placed  . EYE SURGERY Left    tube placed  . FOOT SURGERY     bilateral   . JOINT REPLACEMENT    . NASAL SEPTUM SURGERY    . REPLACEMENT TOTAL KNEE     bilateral  . SHOULDER SURGERY     right shoulder    Allergies  Allergies  Allergen Reactions  . [name] [Moxifloxacin Hcl In Nacl] Other (See Comments)    Headache  . Bimatoprost Other (See Comments)    Made my eyes red, and hurt Made my eyes red, and hurt Made my eyes red, and hurt Made my eyes red, and hurt  . Brimonidine Other (See Comments)    Made my eyes red, and hurt Made my eyes red, and hurt Burned eyes.  . Mobic [Meloxicam] Other (See Comments)    headache  . Propofol Other (See Comments)    Had difficulty waking up Had difficulty waking up Difficult to wake up.  . Travoprost Other (See Comments)    Unknown  . Vicodin [Hydrocodone-Acetaminophen] Nausea And Vomiting and Other (See Comments)    Sick on stomach  . Cephalexin Other (See Comments) and Nausea And Vomiting    headache headache headache    History of Present Illness    79 year old female with above past medical history including permanent atrial fibrillation, borderline diabetes, hyperlipidemia, and diastolic heart failure.  She  previously smoked but quit more than 30 years ago.  She underwent stress testing in November 2015, which was nonischemic.  Most recent echocardiogram in January 2020 showed normal LV function, mild MR, mildly dilated left atrium, normal RV function, and a PASP of 35 mmHg.  She was last seen in cardiology clinic in June of this year.  Fenofibrate therapy was added in the setting of hypertriglyceridemia.  Follow-up labs in August showed significant improvement in triglycerides down to 199 from 562.  Since her last visit, she has continued to do well.  She is reasonably active but not routinely exercising.  She does have some degree of dyspnea on exertion which is chronic and stable.  She walked quite a bit today and even  had to hurry to get to our office on time and was able to do so without having to take break.  She was a little bit harried by the time she arrived.  She does not experience chest pain and denies palpitations, PND, orthopnea, dizziness, syncope, edema, or early satiety.  Home Medications    Prior to Admission medications   Medication Sig Start Date End Date Taking? Authorizing Provider  Calcium Carbonate-Vitamin D (CALCIUM-VITAMIN D3 PO) Take 1 tablet by mouth daily. Calcium 600 = D3 1000    [provider]  celecoxib (CELEBREX) 200 MG capsule TAKE 1 CAPSULE BY MOUTH TWICE DAILY 09/30/20   Chrismon, Vickki Muff, PA-C  Cholecalciferol (VITAMIN D3) 50 MCG (2000 UT) capsule Take 2,000 Units by mouth daily.    [provider]  cycloSPORINE (RESTASIS) 0.05 % ophthalmic emulsion Place 1 drop into both eyes 2 (two) times daily.     [provider]  digoxin (LANOXIN) 0.125 MG tablet TAKE 1 TABLET BY MOUTH ONCE EVERY MORNING 07/02/20   Theora Gianotti, NP  dorzolamide-timolol (COSOPT) 22.3-6.8 MG/ML ophthalmic solution Place 1 drop into both eyes 2 (two) times daily.     [provider]  ELIQUIS 5 MG TABS tablet TAKE 1 TABLET BY MOUTH TWICE DAILY 10/13/20   Wellington Hampshire, MD  fenofibrate (TRICOR) 145 MG tablet Take 1 tablet (145 mg total) by mouth daily. 05/30/20   Theora Gianotti, NP  furosemide (LASIX) 20 MG tablet TAKE 1 TABLET BY MOUTH ONCE DAILY 09/17/20   Wellington Hampshire, MD  latanoprost (XALATAN) 0.005 % ophthalmic solution Place 1 drop into both eyes daily.  03/22/18   [provider]  LYRICA 100 MG capsule TAKE 1 CAPSULE BY MOUTH ONCE DAILY 08/20/20   [name], PA-C  Magnesium 400 MG CAPS Take 400 mg by mouth daily.     [provider]  metoprolol succinate (TOPROL-XL) 25 MG 24 hr tablet Take 0.5 tablets (12.5 mg total) by mouth daily. 10/07/20   [name], PA-C  METRONIDAZOLE, TOPICAL, 0.75 % LOTN Apply  topically daily. Apply to face in AM    [provider]  Multiple Vitamin (MULTIVITAMIN) tablet Take 1 tablet by mouth daily.    [provider]  NON FORMULARY Place 12 sprays into both nostrils 6 (six) times daily. 250GU irrigant    [provider]  Omega-3 Fatty Acids (FISH OIL) 1200 MG CAPS Take by mouth daily.     [provider]  omeprazole (PRILOSEC) 40 MG capsule Take 40 mg by mouth every morning.     [provider]  PREMARIN 0.625 MG tablet TAKE 1 TABLET BY MOUTH ONCE DAILY 05/06/20   [name], PA-C  RHOPRESSA  0.02 % SOLN  08/08/20   [provider]  SOOLANTRA 1 % CREA Apply 1 application topically daily. Sometimes uses twice daily as needed for flare ups. 11/20/18   [provider]    Review of Systems    Chronic dyspnea exertion which is stable.  She denies chest pain, palpitations, PND, orthopnea, dizziness, syncope, edema, or early satiety.  All other systems reviewed and are otherwise negative except as noted above.  Physical Exam    VS:  BP 120/80 (BP Location: Left Arm, Patient Position: Sitting, Cuff Size: Normal)   Pulse 62   Ht 5\' 8"  (1.727 m)   Wt 189 lb (85.7 kg)   SpO2 96%   BMI 28.74 kg/m  , BMI Body mass index is 28.74 kg/m. GEN: Well nourished, well developed, in no acute distress. HEENT: normal. Neck: Supple, no JVD, carotid bruits, or masses. Cardiac: Irregularly irregular, no murmurs, rubs, or gallops. No clubbing, cyanosis, edema.  Radials/PT 2+ and equal bilaterally.  Respiratory:  Respirations regular and unlabored, clear to auscultation bilaterally. GI: Soft, nontender, nondistended, BS + x 4. MS: no deformity or atrophy. Skin: warm and dry, no rash. Neuro:  Strength and sensation are intact. Psych: Normal affect.  Accessory Clinical Findings    ECG personally reviewed by me today -atrial fibrillation, 62, mild inferolateral ST depression- no acute changes.  Lab Results   Component Value Date   WBC 4.6 04/16/2020   HGB 15.0 04/16/2020   HCT 42.6 04/16/2020   MCV 96 04/16/2020   PLT 179 04/16/2020   Lab Results  Component Value Date   CREATININE 0.83 07/17/2020   BUN 26 (H) 07/17/2020   NA 139 07/17/2020   K 4.2 07/17/2020   CL 103 07/17/2020   CO2 26 07/17/2020   Lab Results  Component Value Date   ALT 30 07/17/2020   AST 25 07/17/2020   ALKPHOS 39 07/17/2020   BILITOT 0.9 07/17/2020   Lab Results  Component Value Date   CHOL 210 (H) 07/17/2020   HDL 57 07/17/2020   LDLCALC 113 (H) 07/17/2020   TRIG 199 (H) 07/17/2020   CHOLHDL 3.7 07/17/2020    Lab Results  Component Value Date   HGBA1C 6.1 (H) 04/16/2020    Assessment & Plan    1.  Permanent atrial fibrillation: Well-controlled on low-dose beta-blocker and digoxin therapy.  Normal digoxin level in August.  She is chronically anticoagulated on Eliquis.  Been greater than 6 months since last CBC and so I will follow-up labs today.  2.  Hyperlipidemia/hypertriglyceridemia: LDL of 113 with triglycerides of 199 in August following initiation of fenofibrate therapy.  LFTs normal at that time.  3.  Chronic diastolic congestive heart failure: Heart rate and blood pressure well controlled.  She is euvolemic on examination and continues to take a daily dose of Lasix 20 mg.  Follow-up basic metabolic panel today.  4.  Disposition: Follow-up CBC and basic metabolic panel today.  Follow-up in clinic in 6 months or sooner if necessary.   Murray Hodgkins, NP 11/26/2020, 12:05 PM

## 2020-11-27 LAB — BASIC METABOLIC PANEL
BUN/Creatinine Ratio: 23 (ref 12–28)
BUN: 22 mg/dL (ref 8–27)
CO2: 25 mmol/L (ref 20–29)
Calcium: 9.6 mg/dL (ref 8.7–10.3)
Chloride: 100 mmol/L (ref 96–106)
Creatinine, Ser: 0.96 mg/dL (ref 0.57–1.00)
GFR calc Af Amer: 66 mL/min/{1.73_m2} (ref >59)
GFR calc non Af Amer: 57 mL/min/{1.73_m2} — ABNORMAL LOW (ref >59)
Glucose: 131 mg/dL — ABNORMAL HIGH (ref 65–99)
Potassium: 4.8 mmol/L (ref 3.5–5.2)
Sodium: 141 mmol/L (ref 134–144)

## 2020-11-27 LAB — CBC
Hematocrit: 46.7 % — ABNORMAL HIGH (ref 34.0–46.6)
Hemoglobin: 16 g/dL — ABNORMAL HIGH (ref 11.1–15.9)
MCH: 33 pg (ref 26.6–33.0)
MCHC: 34.3 g/dL (ref 31.5–35.7)
MCV: 96 fL (ref 79–97)
Platelets: 171 10*3/uL (ref 150–450)
RBC: 4.85 x10E6/uL (ref 3.77–5.28)
RDW: 12.4 % (ref 11.7–15.4)
WBC: 4.8 10*3/uL (ref 3.4–10.8)

## 2020-12-09 ENCOUNTER — Telehealth: Payer: Self-pay

## 2020-12-09 ENCOUNTER — Telehealth: Payer: PPO

## 2020-12-09 NOTE — Telephone Encounter (Signed)
Copied from CRM [id number]. Topic: General - Other >> Dec 08, 2020  3:19 PM Laural Benes, [place] C wrote: Reason for CRM: pt called in for assistance with possibly rescheduling her apt with the nurse tomorrow. Pt says that she's not sure if 3:00 will work with her schedule due to other apt's   CB:  [phone]

## 2020-12-09 NOTE — Telephone Encounter (Signed)
  Chronic Care Management   Outreach Note  12/09/2020 Name: JACOYA BAUMAN MRN: [id number] DOB: 1941/10/16  Primary Care Provider: Margaretann Loveless, PA-C Reason for referral : Chronic Care Management   An unsuccessful telephone outreach was attempted today. Ms. Cargo was referred to the case management team for assistance with care management and care coordination.    Follow Up Plan:  A HIPAA compliant voice message was left today requesting a return call.    [facility] Care Management [facility] [phone]

## 2020-12-10 DIAGNOSIS — I4891 Unspecified atrial fibrillation: Secondary | ICD-10-CM | POA: Diagnosis not present

## 2020-12-11 ENCOUNTER — Other Ambulatory Visit: Payer: Self-pay

## 2020-12-11 ENCOUNTER — Encounter: Payer: Self-pay | Admitting: Ophthalmology

## 2020-12-11 ENCOUNTER — Telehealth: Payer: Self-pay | Admitting: Cardiovascular Disease

## 2020-12-11 NOTE — Telephone Encounter (Signed)
   [facility] Pre-operative Risk Assessment    HEARTCARE STAFF: - Please ensure there is not already an duplicate clearance open for this procedure. - Under Visit Info/Reason for Call, type in Other and utilize the format Clearance MM/DD/YY or Clearance TBD. Do not use dashes or single digits. - If request is for dental extraction, please clarify the # of teeth to be extracted.  Request for surgical clearance:  1. What type of surgery is being performed? Eye surgery  2. When is this surgery scheduled? 12/19/20  3. What type of clearance is required (medical clearance vs. Pharmacy clearance to hold med vs. Both)? both  4. Are there any medications that need to be held prior to surgery and how long? Needs to discontinue Eliquis 3 days prior to surgery  5. Practice name and name of physician performing surgery? [facility], Norlene Duel, MD  6. What is the office phone number? [phone]   7.   What is the office fax number? [phone]  8.   Anesthesia type (None, local, MAC, general) ? Not listed    Ace Gins 12/11/2020, 4:52 PM  _________________________________________________________________   (provider comments below)

## 2020-12-12 ENCOUNTER — Telehealth: Payer: Self-pay | Admitting: Cardiovascular Disease

## 2020-12-12 NOTE — Telephone Encounter (Signed)
I called to go over medication instructions to hold her Eliquis x 3 days prior. Left message for the pt she has been cleared for her upcoming procedure. Left message that I will fax these notes to Dr. Norlene Duel with [facility]. Left message for the pt she can call the requesting office or our office to go over the medication instructions. I will remove from the pre op call back pool.

## 2020-12-12 NOTE — Telephone Encounter (Signed)
BLEPHAROPLASTY UPPER EYELID; W/EXCESS SKIN BLEPHAROPTOSIS REPAIR; RESECT EX BILATERAL Bilateral   MAC ANESTHESIA

## 2020-12-12 NOTE — Telephone Encounter (Signed)
Patient with diagnosis of afib on Eliquis for anticoagulation.    Procedure: BLEPHAROPLASTY UPPER EYELID; W/EXCESS SKIN BLEPHAROPTOSIS REPAIR; RESECT EX BILATERAL Date of procedure: 12/19/20    CHA2DS2-VASc Score = 4  This indicates a 4.8% annual risk of stroke. The patient's score is based upon: CHF History: Yes HTN History: No Diabetes History: No Stroke History: No Vascular Disease History: No Age Score: 2 Gender Score: 1     CrCl 48 ml/min  Per protocol, patient may hold Eliquis for 3 days prior to procedure.

## 2020-12-12 NOTE — Telephone Encounter (Signed)
   Duplicate request. Please see clearance telephone note from 12/12/19.   Abigail Butts, PA-C 12/12/20; 10:20 AM

## 2020-12-12 NOTE — Telephone Encounter (Signed)
   Primary Cardiologist: Kathlyn Sacramento, MD  Chart reviewed as part of pre-operative protocol coverage. Patient was contacted 12/12/2020 in reference to pre-operative risk assessment for pending surgery as outlined below.  Teresa Harris was last seen on 11/26/20 by Ignacia Bayley, NP.  Since that day, Teresa Harris has done well from a cardiac standpoint. She has chronic DOE which is unchanged in recent months. She can complete 4 METs without anginal complaints.  Therefore, based on ACC/AHA guidelines, the patient would be at acceptable risk for the planned procedure without further cardiovascular testing.   The patient was advised that if she develops new symptoms prior to surgery to contact our office to arrange for a follow-up visit, and she verbalized understanding.  Per pharmacy recommendations, patient can hold eliquis 3 days prior to her upcoming procedure with plans to restart as soon as she is cleared to do so by her surgeon.  I will route this recommendation to the requesting party via Epic fax function and remove from pre-op pool. Please call with questions.  Abigail Butts, PA-C 12/12/2020, 9:55 AM

## 2020-12-12 NOTE — Telephone Encounter (Signed)
Will send to pre op for any final notes for clearance

## 2020-12-12 NOTE — Telephone Encounter (Signed)
   [facility] Pre-operative Risk Assessment    HEARTCARE STAFF: - Please ensure there is not already an duplicate clearance open for this procedure. - Under Visit Info/Reason for Call, type in Other and utilize the format Clearance MM/DD/YY or Clearance TBD. Do not use dashes or single digits. - If request is for dental extraction, please clarify the # of teeth to be extracted.  Request for surgical clearance:  1. What type of surgery is being performed? EYE SURGERY, NO SPECIFICS  2. When is this surgery scheduled? 12/19/20  3. What type of clearance is required (medical clearance vs. Pharmacy clearance to hold med vs. Both)? BOTH  4. Are there any medications that need to be held prior to surgery and how long? ELIQUIS 3 DAYS PRIOR TO SURGERY  5. Practice name and name of physician performing surgery?  [facility], DR AMY FOWLER  6. What is the office phone number? [phone]   7.   What is the office fax number? [phone]  8.   Anesthesia type (None, local, MAC, general) ? NOT LISTED   Teresa Harris 12/12/2020, 9:36 AM  _________________________________________________________________   (provider comments below)

## 2020-12-12 NOTE — Telephone Encounter (Addendum)
Can we please confirm what type of eye surgery? Thanks   Patient with diagnosis of afib on Eliquis for anticoagulation.    Procedure: BLEPHAROPLASTY UPPER EYELID; W/EXCESS SKIN BLEPHAROPTOSIS REPAIR; RESECT EX BILATERAL Date of procedure: 12/19/20    CHA2DS2-VASc Score = 4  This indicates a 4.8% annual risk of stroke. The patient's score is based upon: CHF History: Yes HTN History: No Diabetes History: No Stroke History: No Vascular Disease History: No Age Score: 2 Gender Score: 1      CrCl 48 ml/min  Per protocol, patient may hold Eliquis for 3 days prior to procedure.

## 2020-12-15 NOTE — Discharge Instructions (Signed)
INSTRUCTIONS FOLLOWING OCULOPLASTIC SURGERY AMY M. FOWLER, MD  AFTER YOUR EYE SURGERY, THER ARE MANY THINGS WHICH YOU, THE PATIENT, CAN DO TO ASSURE THE BEST POSSIBLE RESULT FROM YOUR OPERATION.  THIS SHEET SHOULD BE REFERRED TO WHENEVER QUESTIONS ARISE.  IF THERE ARE ANY QUESTIONS NOT ANSWERED HERE, DO NOT HESITATE TO CALL OUR OFFICE AT [phone] OR [phone].  THERE IS ALWAYS SOMEONE AVAILABLE TO CALL IF QUESTIONS OR PROBLEMS ARISE.  VISION: Your vision may be blurred and out of focus after surgery until you are able to stop using your ointment, swelling resolves and your eye(s) heal. This may take 1 to 2 weeks at the least.  If your vision becomes gradually more dim or dark, this is not normal and you need to call our office immediately.  EYE CARE: For the first 48 hours after surgery, use ice packs frequently - "20 minutes on, 20 minutes off" - to help reduce swelling and bruising.  Small bags of frozen peas or corn make good ice packs along with cloths soaked in ice water.  If you are wearing a patch or other type of dressing following surgery, keep this on for the amount of time specified by your doctor.  For the first week following surgery, you will need to treat your stitches with great care.  It is OK to shower, but take care to not allow soapy water to run into your eye(s) to help reduce chances of infection.  You may gently clean the eyelashes and around the eye(s) with cotton balls and sterile water, BUT DO NOT RUB THE STITCHES VIGOROUSLY.  Keeping your stitches moist with ointment will help promote healing with minimal scar formation.  ACTIVITY: When you leave the surgery center, you should go home, rest and be inactive.  The eye(s) may feel scratchy and keeping the eyes closed will allow for faster healing.  The first week following surgery, avoid straining (anything making the face turn red) or lifting over 20 pounds.  Additionally, avoid bending which causes your head to go below  your waist.  Using your eyes will NOT harm them, so feel free to read, watch television, use the computer, etc as desired.  Driving depends on each individual, so check with your doctor if you have questions about driving. Do not wear contact lenses for about 2 weeks.  Do not wear eye makeup for 2 weeks.  Avoid swimming, hot tubs, gardening, and dusting for 1 to 2 weeks to reduce the risk of an infection.  MEDICATIONS:  You will be given a prescription for an ointment to use 4 times a day on your stitches.  You can use the ointment in your eyes if they feel scratchy or irritated.  If you eyelid(s) don't close completely when you sleep, put some ointment in your eyes before bedtime.  EMERGENCY: If you experience SEVERE EYE PAIN OR HEADACHE UNRELIEVED BY TYLENOL OR TRAMADOL, NAUSEA OR VOMITING, WORSENING REDNESS, OR WORSENING VISION (ESPECIALLY VISION THAT WAS INITIALLY BETTER) CALL [phone] OR [phone] DURING BUSINESS HOURS OR AFTER HOURS. General Anesthesia, Adult, Care After This sheet gives you information about how to care for yourself after your procedure. Your health care provider may also give you more specific instructions. If you have problems or questions, contact your health care provider. What can I expect after the procedure? After the procedure, the following side effects are common:  Pain or discomfort at the IV site.  Nausea.  Vomiting.  Sore throat.  Trouble concentrating.  Feeling   cold or chills.  Feeling weak or tired.  Sleepiness and fatigue.  Soreness and body aches. These side effects can affect parts of the body that were not involved in surgery. Follow these instructions at home: For the time period you were told by your health care provider:  Rest.  Do not participate in activities where you could fall or become injured.  Do not drive or use machinery.  Do not drink alcohol.  Do not take sleeping pills or medicines that cause drowsiness.  Do  not make important decisions or sign legal documents.  Do not take care of children on your own.   Eating and drinking  Follow any instructions from your health care provider about eating or drinking restrictions.  When you feel hungry, start by eating small amounts of foods that are soft and easy to digest (bland), such as toast. Gradually return to your regular diet.  Drink enough fluid to keep your urine pale yellow.  If you vomit, rehydrate by drinking water, juice, or clear broth. General instructions  If you have sleep apnea, surgery and certain medicines can increase your risk for breathing problems. Follow instructions from your health care provider about wearing your sleep device: ? Anytime you are sleeping, including during daytime naps. ? While taking prescription pain medicines, sleeping medicines, or medicines that make you drowsy.  Have a responsible adult stay with you for the time you are told. It is important to have someone help care for you until you are awake and alert.  Return to your normal activities as told by your health care provider. Ask your health care provider what activities are safe for you.  Take over-the-counter and prescription medicines only as told by your health care provider.  If you smoke, do not smoke without supervision.  Keep all follow-up visits as told by your health care provider. This is important. Contact a health care provider if:  You have nausea or vomiting that does not get better with medicine.  You cannot eat or drink without vomiting.  You have pain that does not get better with medicine.  You are unable to pass urine.  You develop a skin rash.  You have a fever.  You have redness around your IV site that gets worse. Get help right away if:  You have difficulty breathing.  You have chest pain.  You have blood in your urine or stool, or you vomit blood. Summary  After the procedure, it is common to have a sore  throat or nausea. It is also common to feel tired.  Have a responsible adult stay with you for the time you are told. It is important to have someone help care for you until you are awake and alert.  When you feel hungry, start by eating small amounts of foods that are soft and easy to digest (bland), such as toast. Gradually return to your regular diet.  Drink enough fluid to keep your urine pale yellow.  Return to your normal activities as told by your health care provider. Ask your health care provider what activities are safe for you. This information is not intended to replace advice given to you by your health care provider. Make sure you discuss any questions you have with your health care provider. Document Revised: 08/07/2020 Document Reviewed: 03/06/2020 Elsevier Patient Education  2021 [employer]  

## 2020-12-17 ENCOUNTER — Other Ambulatory Visit
Admission: RE | Admit: 2020-12-17 | Discharge: 2020-12-17 | Disposition: A | Discharge status: Home / Self Care | Payer: PPO | Source: Ambulatory Visit | Attending: Ophthalmology | Admitting: Ophthalmology

## 2020-12-17 ENCOUNTER — Other Ambulatory Visit: Payer: Self-pay

## 2020-12-17 DIAGNOSIS — Z20822 Contact with and (suspected) exposure to covid-19: Secondary | ICD-10-CM | POA: Insufficient documentation

## 2020-12-17 DIAGNOSIS — Z01812 Encounter for preprocedural laboratory examination: Secondary | ICD-10-CM | POA: Diagnosis not present

## 2020-12-18 LAB — SARS CORONAVIRUS 2 (TAT 6-24 HRS): SARS Coronavirus 2: NEGATIVE

## 2020-12-19 ENCOUNTER — Encounter
Admission: RE | Disposition: A | Discharge status: Home / Self Care | Payer: Self-pay | Source: Home / Self Care | Attending: Ophthalmology

## 2020-12-19 ENCOUNTER — Ambulatory Visit
Admission: RE | Admit: 2020-12-19 | Discharge: 2020-12-19 | Disposition: A | Discharge status: Home / Self Care | Payer: PPO | Attending: Ophthalmology | Admitting: Ophthalmology

## 2020-12-19 ENCOUNTER — Encounter: Payer: Self-pay | Admitting: Ophthalmology

## 2020-12-19 ENCOUNTER — Other Ambulatory Visit: Payer: Self-pay

## 2020-12-19 ENCOUNTER — Ambulatory Visit: Payer: PPO | Admitting: Anesthesiology

## 2020-12-19 DIAGNOSIS — Z886 Allergy status to analgesic agent status: Secondary | ICD-10-CM | POA: Diagnosis not present

## 2020-12-19 DIAGNOSIS — H02403 Unspecified ptosis of bilateral eyelids: Secondary | ICD-10-CM | POA: Insufficient documentation

## 2020-12-19 DIAGNOSIS — Z888 Allergy status to other drugs, medicaments and biological substances status: Secondary | ICD-10-CM | POA: Diagnosis not present

## 2020-12-19 DIAGNOSIS — H02834 Dermatochalasis of left upper eyelid: Secondary | ICD-10-CM | POA: Diagnosis not present

## 2020-12-19 DIAGNOSIS — H02831 Dermatochalasis of right upper eyelid: Secondary | ICD-10-CM | POA: Diagnosis not present

## 2020-12-19 DIAGNOSIS — Z87891 Personal history of nicotine dependence: Secondary | ICD-10-CM | POA: Diagnosis not present

## 2020-12-19 DIAGNOSIS — Z79899 Other long term (current) drug therapy: Secondary | ICD-10-CM | POA: Insufficient documentation

## 2020-12-19 DIAGNOSIS — Z881 Allergy status to other antibiotic agents status: Secondary | ICD-10-CM | POA: Insufficient documentation

## 2020-12-19 HISTORY — DX: Presence of external hearing-aid: Z97.4

## 2020-12-19 HISTORY — DX: Presence of dental prosthetic device (complete) (partial): Z97.2

## 2020-12-19 HISTORY — DX: Motion sickness, initial encounter: T75.3XXA

## 2020-12-19 HISTORY — PX: BROW LIFT: SHX178

## 2020-12-19 HISTORY — DX: Polyneuropathy, unspecified: G62.9

## 2020-12-19 SURGERY — BLEPHAROPLASTY
Anesthesia: Monitor Anesthesia Care | Site: Eye | Laterality: Bilateral

## 2020-12-19 MED ORDER — TETRACAINE HCL 0.5 % OP SOLN
OPHTHALMIC | Status: DC | PRN
  Administered 2020-12-19: 1 [drp] via OPHTHALMIC

## 2020-12-19 MED ORDER — PROPOFOL 500 MG/50ML IV EMUL
INTRAVENOUS | Status: DC | PRN
  Administered 2020-12-19: 50 ug/kg/min via INTRAVENOUS

## 2020-12-19 MED ORDER — LACTATED RINGERS IV SOLN: INTRAVENOUS | Status: DC

## 2020-12-19 MED ORDER — OXYCODONE HCL 5 MG/5ML PO SOLN: 5.0000 mg | Freq: Once | ORAL | Status: DC | PRN

## 2020-12-19 MED ORDER — ERYTHROMYCIN 5 MG/GM OP OINT: TOPICAL_OINTMENT | OPHTHALMIC | 2 refills | Status: DC

## 2020-12-19 MED ORDER — GLYCOPYRROLATE 0.2 MG/ML IJ SOLN
INTRAMUSCULAR | Status: DC | PRN
  Administered 2020-12-19: .1 mg via INTRAVENOUS

## 2020-12-19 MED ORDER — TRAMADOL HCL 50 MG PO TABS: ORAL_TABLET | ORAL | 0 refills | Status: DC

## 2020-12-19 MED ORDER — ALFENTANIL 500 MCG/ML IJ INJ
INJECTION | INTRAVENOUS | Status: DC | PRN
  Administered 2020-12-19: 100 ug via INTRAVENOUS
  Administered 2020-12-19: 400 ug via INTRAVENOUS

## 2020-12-19 MED ORDER — LIDOCAINE-EPINEPHRINE 2 %-1:100000 IJ SOLN
INTRAMUSCULAR | Status: DC | PRN
  Administered 2020-12-19: 2.5 mL via OPHTHALMIC

## 2020-12-19 MED ORDER — ONDANSETRON HCL 4 MG/2ML IJ SOLN
INTRAMUSCULAR | Status: DC | PRN
  Administered 2020-12-19: 4 mg via INTRAVENOUS

## 2020-12-19 MED ORDER — MIDAZOLAM HCL 2 MG/2ML IJ SOLN
INTRAMUSCULAR | Status: DC | PRN
  Administered 2020-12-19 (×2): 1 mg via INTRAVENOUS

## 2020-12-19 MED ORDER — ERYTHROMYCIN 5 MG/GM OP OINT
TOPICAL_OINTMENT | OPHTHALMIC | Status: DC | PRN
  Administered 2020-12-19: 1 via OPHTHALMIC

## 2020-12-19 MED ORDER — LIDOCAINE HCL (CARDIAC) PF 100 MG/5ML IV SOSY
PREFILLED_SYRINGE | INTRAVENOUS | Status: DC | PRN
  Administered 2020-12-19: 40 mg via INTRAVENOUS

## 2020-12-19 MED ORDER — OXYCODONE HCL 5 MG PO TABS: 5.0000 mg | ORAL_TABLET | Freq: Once | ORAL | Status: DC | PRN

## 2020-12-19 MED ORDER — BSS IO SOLN
INTRAOCULAR | Status: DC | PRN
  Administered 2020-12-19: 15 mL via INTRAOCULAR

## 2020-12-19 SURGICAL SUPPLY — 38 items
APPLICATOR COTTON TIP WD 3 STR (MISCELLANEOUS) ×2 IMPLANT
BLADE SURG 15 STRL LF DISP TIS (BLADE) ×1 IMPLANT
BLADE SURG 15 STRL SS (BLADE) ×2
CORD BIP STRL DISP 12FT (MISCELLANEOUS) ×2 IMPLANT
DRAPE HEAD BAR (DRAPES) ×2 IMPLANT
GAUZE SPONGE 4X4 12PLY STRL (GAUZE/BANDAGES/DRESSINGS) ×2 IMPLANT
GLOVE SURG LX 7.0 MICRO (GLOVE) ×2
GLOVE SURG LX STRL 7.0 MICRO (GLOVE) ×2 IMPLANT
GOWN STRL REUS W/ TWL LRG LVL3 (GOWN DISPOSABLE) ×1 IMPLANT
GOWN STRL REUS W/TWL LRG LVL3 (GOWN DISPOSABLE) ×2
MARKER SKIN XFINE TIP W/RULER (MISCELLANEOUS) ×2 IMPLANT
NDL FILTER BLUNT 18X1 1/2 (NEEDLE) ×1 IMPLANT
NDL HYPO 30X.5 LL (NEEDLE) ×2 IMPLANT
NEEDLE FILTER BLUNT 18X 1/2SAF (NEEDLE) ×1
NEEDLE FILTER BLUNT 18X1 1/2 (NEEDLE) ×1 IMPLANT
NEEDLE HYPO 30X.5 LL (NEEDLE) ×4 IMPLANT
PACK ENT CUSTOM (PACKS) ×2 IMPLANT
SOL PREP PVP 2OZ (MISCELLANEOUS) ×2
SOLUTION PREP PVP 2OZ (MISCELLANEOUS) ×1 IMPLANT
SPONGE GAUZE 2X2 8PLY STRL LF (GAUZE/BANDAGES/DRESSINGS) ×20 IMPLANT
SUT CHROMIC 4-0 (SUTURE)
SUT CHROMIC 4-0 M2 12X2 ARM (SUTURE)
SUT CHROMIC 5 0 P 3 (SUTURE) IMPLANT
SUT ETHILON 4 0 CL P 3 (SUTURE) IMPLANT
SUT GUT PLAIN 6-0 1X18 ABS (SUTURE) ×2 IMPLANT
SUT MERSILENE 4-0 S-2 (SUTURE) IMPLANT
SUT PROLENE 5 0 P 3 (SUTURE) IMPLANT
SUT PROLENE 6 0 P 1 18 (SUTURE) ×2 IMPLANT
SUT SILK 4 0 G 3 (SUTURE) IMPLANT
SUT VIC AB 5-0 P-3 18X BRD (SUTURE) IMPLANT
SUT VIC AB 5-0 P3 18 (SUTURE)
SUT VICRYL 6-0  S14 CTD (SUTURE)
SUT VICRYL 6-0 S14 CTD (SUTURE) IMPLANT
SUT VICRYL 7 0 TG140 8 (SUTURE) IMPLANT
SUTURE CHRMC 4-0 M2 12X2 ARM (SUTURE) IMPLANT
SYR 10ML LL (SYRINGE) ×2 IMPLANT
SYR 3ML LL SCALE MARK (SYRINGE) ×2 IMPLANT
WATER STERILE IRR 250ML POUR (IV SOLUTION) ×2 IMPLANT

## 2020-12-19 NOTE — Anesthesia Postprocedure Evaluation (Signed)
Anesthesia Post Note  Patient: Teresa Harris  Procedure(s) Performed: BLEPHAROPLASTY UPPER EYELID; W/EXCESS SKIN BLEPHAROPTOSIS REPAIR; RESECT EX BILATERAL (Bilateral Eye)     Patient location during evaluation: PACU Anesthesia Type: MAC Level of consciousness: awake and alert Pain management: pain level controlled Vital Signs Assessment: post-procedure vital signs reviewed and stable Respiratory status: spontaneous breathing, nonlabored ventilation, respiratory function stable and patient connected to nasal cannula oxygen Cardiovascular status: stable and blood pressure returned to baseline Postop Assessment: no apparent nausea or vomiting Anesthetic complications: no   No complications documented.  Fidel Levy

## 2020-12-19 NOTE — Anesthesia Procedure Notes (Signed)
Procedure Name: MAC Date/Time: 12/19/2020 10:22 AM Performed by: Dionne Bucy, CRNA Pre-anesthesia Checklist: Patient identified, Emergency Drugs available, Suction available, Patient being monitored and Timeout performed Patient Re-evaluated:Patient Re-evaluated prior to induction Oxygen Delivery Method: Nasal cannula Placement Confirmation: positive ETCO2

## 2020-12-19 NOTE — H&P (Signed)
  See the history and physical completed at [facility] on 12/10/2020 and scanned into the chart.

## 2020-12-19 NOTE — Interval H&P Note (Signed)
History and Physical Interval Note:  12/19/2020 10:04 AM  Teresa Harris  has presented today for surgery, with the diagnosis of H02.831 - Dermatochalasis of Right Upper Eyelid H02.834 - Dermatochalasis of Left Upper Eyelid H02.403 - Ptosis of Eyelid, Unspecified, Bilateral.  The various methods of treatment have been discussed with the patient and family. After consideration of risks, benefits and other options for treatment, the patient has consented to  Procedure(s): BLEPHAROPLASTY UPPER EYELID; W/EXCESS SKIN BLEPHAROPTOSIS REPAIR; RESECT EX BILATERAL (Bilateral) as a surgical intervention.  The patient's history has been reviewed, patient examined, no change in status, stable for surgery.  I have reviewed the patient's chart and labs.  Questions were answered to the patient's satisfaction.     Vickki Muff, Teresa Harris

## 2020-12-19 NOTE — Anesthesia Preprocedure Evaluation (Signed)
Anesthesia Evaluation  Patient identified by MRN, date of birth, ID band Patient awake    Reviewed: NPO status   History of Anesthesia Complications (+) PONV, PROLONGED EMERGENCE and history of anesthetic complications (trouble waking up with Propofol for colonoscopy)  Airway Mallampati: II  TM Distance: >3 FB Neck ROM: full    Dental  (+) Upper Dentures, Lower Dentures   Pulmonary neg pulmonary ROS, former smoker,    Pulmonary exam normal        Cardiovascular Exercise Tolerance: Good + dysrhythmias (on eliquis) Atrial Fibrillation      Neuro/Psych Glaucoma  neuropahty : feet negative psych ROS   GI/Hepatic Neg liver ROS, GERD  Controlled,  Endo/Other  negative endocrine ROS  Renal/GU negative Renal ROS  negative genitourinary   Musculoskeletal  (+) Arthritis ,   Abdominal   Peds  Hematology negative hematology ROS (+)   Anesthesia Other Findings Covid: NEG.   12/2018 Echo: EF 60-65%, no rwma, mild MR. Mildly dil LA. Nl RV fxn. PASP 87mHg;  ekg: 11/2020: afib at 62;  Cardiology : 11/2020: BSharolyn Douglas PA;  last Eliquis: 1/10;   Reproductive/Obstetrics                             Anesthesia Physical Anesthesia Plan  ASA: III  Anesthesia Plan: MAC   Post-op Pain Management:    Induction:   PONV Risk Score and Plan: Propofol infusion, TIVA and Treatment may vary due to age or medical condition  Airway Management Planned:   Additional Equipment:   Intra-op Plan:   Post-operative Plan:   Informed Consent: I have reviewed the patients History and Physical, chart, labs and discussed the procedure including the risks, benefits and alternatives for the proposed anesthesia with the patient or authorized representative who has indicated his/her understanding and acceptance.       Plan Discussed with: CRNA  Anesthesia Plan Comments:         Anesthesia Quick  Evaluation

## 2020-12-19 NOTE — Op Note (Signed)
Preoperative Diagnosis:  1. Visually significant blepharoptosis bilateral  Upper Eyelid(s) 2. Visually significant dermatochalasis bilateral  Upper Eyelid(s)  Postoperative Diagnosis:  Same.  Procedure(s) Performed:   1. Blepharoptosis repair with levator aponeurosis advancement bilateral  Upper Eyelid(s) 2. Upper eyelid blepharoplasty with excess skin excision  bilateral  Upper Eyelid(s)  Surgeon: Teresa Harris. Teresa Harris, M.D.  Assistants: none  Anesthesia: MAC  Specimens: None.  Estimated Blood Loss: Minimal.  Complications: None.  Operative Findings: None Dictated  Procedure:   Allergies were reviewed and the patient [name] [moxifloxacin hcl in nacl], Bimatoprost, Brimonidine, Eszopiclone, Mobic [meloxicam], Propofol, Travoprost, Vicodin [hydrocodone-acetaminophen], and Cephalexin.   After the risks, benefits, complications and alternatives were discussed with the patient, appropriate informed consent was obtained.  While seated in an upright position and looking in primary gaze, the mid pupillary line was marked on the upper eyelid margins bilaterally. The patient was then brought to the operating suite and reclined supine.  Timeout was conducted and the patient was sedated.  Local anesthetic consisting of a 50-50 mixture of 2% lidocaine with epinephrine and 0.75% bupivacaine with added Hylenex was injected subcutaneously to both  upper eyelid(s). After adequate local was instilled, the patient was prepped and draped in the usual sterile fashion for eyelid surgery.   Attention was turned to the upper eyelids. A 46m upper eyelid crease incision line was marked with calipers on both  upper eyelid(s).  A pinch test was used to estimate the amount of excess skin to remove and this was marked in standard blepharoplasty style fashion. Attention was turned to the  right  upper eyelid. A #15 blade was used to open the premarked incision line. A Skin only flap was excised and hemostasis was  obtained with bipolar cautery.   [name] scissors were then used to transect through orbicularis down to the tarsal plate. Epitarsus was dissected to create a smooth surface to suture to. Dissection was then carried superiorly in the plane between orbicularis and orbital septum. Once the preaponeurotic fat pocket was identified, the orbital septum was opened. This revealed the levator and its aponeurosis.    Attention was then turned to the opposite eyelid where the same procedure was performed in the same manner. Hemostasis was obtained with bipolar cautery throughout.   3 interrupted 6-0 Prolene sutures were then passed partial thickness through the tarsal plates of both  upper eyelid(s). These sutures were placed in line with the mid pupillary, medial limbal, and lateral limbal lines. The sutures were fixed to the levator aponeurosis and adjusted until a nice lid height and contour were achieved. Once nice symmetry was achieved, the skin incisions were closed with a running 6-0 plain gut suture. The patient tolerated the procedure well.  Erythromycin ophthalmic ophthalmic ointment was applied to the incision site(s) followed by ice packs. The patient was taken to the recovery area where she recovered without difficulty.  Post-Op Plan/Instructions:   The patient was instructed to use ice packs frequently for the next 48 hours. She was instructed to use Erythromycin ophthalmic ophthalmic ointment on her incisions 4 times a day for the next 12 to 14 days. Shewas given a prescription for tramadol (or similar) for pain control should Tylenol not be effective. [facility] [place] or sooner as needed for problems.  Teresa Harris M. FVickki Harris M.D. Ophthalmology

## 2020-12-19 NOTE — Transfer of Care (Signed)
Immediate Anesthesia Transfer of Care Note  Patient: Teresa Harris  Procedure(s) Performed: BLEPHAROPLASTY UPPER EYELID; W/EXCESS SKIN BLEPHAROPTOSIS REPAIR; RESECT EX BILATERAL (Bilateral Eye)  Patient Location: PACU  Anesthesia Type: MAC  Level of Consciousness: awake, alert  and patient cooperative  Airway and Oxygen Therapy: Patient Spontanous Breathing and Patient connected to supplemental oxygen  Post-op Assessment: Post-op Vital signs reviewed, Patient's Cardiovascular Status Stable, Respiratory Function Stable, Patent Airway and No signs of Nausea or vomiting  Post-op Vital Signs: Reviewed and stable  Complications: No complications documented.

## 2020-12-20 DIAGNOSIS — H02403 Unspecified ptosis of bilateral eyelids: Secondary | ICD-10-CM | POA: Diagnosis not present

## 2020-12-30 ENCOUNTER — Ambulatory Visit: Payer: Self-pay

## 2020-12-30 ENCOUNTER — Telehealth: Payer: Self-pay

## 2020-12-30 NOTE — Chronic Care Management (AMB) (Signed)
  Chronic Care Management   Outreach Note  12/30/2020 Name: KESLIE GRITZ MRN: [id number] DOB: [birth year]  Primary Care Provider: [name], PA-C Reason for referral : Chronic Care Management   Ms. Montefusco returned the call today. She was referred to the case management team for assistance with care management and care coordination. Reports doing well and declined need for care management assistance. Reports completing blepharoplasty earlier this month without complications. Reports having all needed medications and states her daughter is available to assist as needed. She agreed to call or notify her primary care provider if her condition changes and care management outreach is required.    PLAN Will update CCM enrollment status. Ms. Sutphin agreed to call or notify her primary care provider if outreach is required. The care management team will gladly assist.    Cristy [facility] Care Management [facility] [phone]

## 2020-12-30 NOTE — Telephone Encounter (Signed)
  Chronic Care Management   Outreach Note  12/30/2020 Name: SHAWNTAE LOWY MRN: [id number] DOB: [birth year]  Primary Care Provider: [name], PA-C Reason for referral : Chronic Care Management   An unsuccessful telephone outreach was attempted today. Ms. Purkey was referred to the case management team for assistance with care management and care coordination.    Follow Up Plan:  A HIPAA compliant voice message was left today requesting a return call.     Cristy [facility] Care Management [facility] [phone]

## 2021-01-27 ENCOUNTER — Other Ambulatory Visit: Payer: Self-pay | Admitting: *Deleted

## 2021-01-27 MED ORDER — DIGOXIN 125 MCG PO TABS: ORAL_TABLET | ORAL | 4 refills | Status: DC

## 2021-02-17 ENCOUNTER — Other Ambulatory Visit: Payer: Self-pay | Admitting: Physician Assistant

## 2021-02-17 DIAGNOSIS — G2581 Restless legs syndrome: Secondary | ICD-10-CM

## 2021-02-17 DIAGNOSIS — G629 Polyneuropathy, unspecified: Secondary | ICD-10-CM

## 2021-02-17 NOTE — Telephone Encounter (Signed)
Requested medication (s) are due for refill today: yes  Requested medication (s) are on the active medication list: yes  Last refill:  08/24/20  Future visit scheduled: yes  Notes to clinic:  med not delegated to NT to RF   Requested Prescriptions  Pending Prescriptions Disp Refills   LYRICA 100 MG capsule [Pharmacy Med Name: LYRICA 100 MG CAP] 90 capsule     Sig: TAKE 1 CAPSULE BY MOUTH ONCE DAILY      Not Delegated - Neurology:  Anticonvulsants - Controlled Failed - 02/17/2021  2:40 PM      Failed - This refill cannot be delegated      Passed - Valid encounter within last 12 months    Recent Outpatient Visits           10 months ago Annual physical exam   [place], Clearnce Sorrel, PA-C   1 year ago Adverse effect of vaccine, initial encounter   [place], Clearnce Sorrel, [place]   1 year ago Acute non-recurrent pansinusitis   [facility] [place], Clearnce Sorrel, [place]   2 years ago Annual physical exam   [facility] [place] M, [place]   2 years ago Rash and nonspecific skin eruption   [facility] [place], [place], [place]

## 2021-03-13 DIAGNOSIS — H401123 Primary open-angle glaucoma, left eye, severe stage: Secondary | ICD-10-CM | POA: Diagnosis not present

## 2021-03-16 ENCOUNTER — Other Ambulatory Visit: Payer: Self-pay | Admitting: Cardiovascular Disease

## 2021-03-17 NOTE — Telephone Encounter (Signed)
Rx request sent to pharmacy.  

## 2021-04-02 NOTE — Patient Instructions (Addendum)
 The CDC recommends two doses of Shingrix (the shingles vaccine) separated by 2 to 6 months for adults age 79 years and older. I recommend checking with your insurance plan regarding coverage for this vaccine.     Preventive Care 65 Years and Older, Female Preventive care refers to lifestyle choices and visits with your health care provider that can promote health and wellness. This includes:  A yearly physical exam. This is also called an annual wellness visit.  Regular dental and eye exams.  Immunizations.  Screening for certain conditions.  Healthy lifestyle choices, such as: ? Eating a healthy diet. ? Getting regular exercise. ? Not using drugs or products that contain nicotine and tobacco. ? Limiting alcohol use. What can I expect for my preventive care visit? Physical exam Your health care provider will check your:  Height and weight. These may be used to calculate your BMI (body mass index). BMI is a measurement that tells if you are at a healthy weight.  Heart rate and blood pressure.  Body temperature.  Skin for abnormal spots. Counseling Your health care provider may ask you questions about your:  Past medical problems.  Family's medical history.  Alcohol, tobacco, and drug use.  Emotional well-being.  Home life and relationship well-being.  Sexual activity.  Diet, exercise, and sleep habits.  History of falls.  Memory and ability to understand (cognition).  Work and work environment.  Pregnancy and menstrual history.  Access to firearms. What immunizations do I need? Vaccines are usually given at various ages, according to a schedule. Your health care provider will recommend vaccines for you based on your age, medical history, and lifestyle or other factors, such as travel or where you work.   What tests do I need? Blood tests  Lipid and cholesterol levels. These may be checked every 5 years, or more often depending on your overall  health.  Hepatitis C test.  Hepatitis B test. Screening  Lung cancer screening. You may have this screening every year starting at age 55 if you have a 30-pack-year history of smoking and currently smoke or have quit within the past 15 years.  Colorectal cancer screening. ? All adults should have this screening starting at age 79 and continuing until age 75. ? Your health care provider may recommend screening at age 45 if you are at increased risk. ? You will have tests every 1-10 years, depending on your results and the type of screening test.  Diabetes screening. ? This is done by checking your blood sugar (glucose) after you have not eaten for a while (fasting). ? You may have this done every 1-3 years.  Mammogram. ? This may be done every 1-2 years. ? Talk with your health care provider about how often you should have regular mammograms.  Abdominal aortic aneurysm (AAA) screening. You may need this if you are a current or former smoker.  BRCA-related cancer screening. This may be done if you have a family history of breast, ovarian, tubal, or peritoneal cancers. Other tests  STD (sexually transmitted disease) testing, if you are at risk.  Bone density scan. This is done to screen for osteoporosis. You may have this done starting at age 65. Talk with your health care provider about your test results, treatment options, and if necessary, the need for more tests. Follow these instructions at home: Eating and drinking  Eat a diet that includes fresh fruits and vegetables, whole grains, lean protein, and low-fat dairy products. Limit your intake   intake of foods with high amounts of sugar, saturated fats, and salt.  Take vitamin and mineral supplements as recommended by your health care provider.  Do not drink alcohol if your health care provider tells you not to drink.  If you drink alcohol: ? Limit how much you have to 0-1 drink a day. ? Be aware of how much alcohol is in your  drink. In the [place] one drink equals one 12 oz bottle of beer (355 mL), one 5 oz glass of wine (148 mL), or one 1 oz glass of hard liquor (44 mL).   Lifestyle  Take daily care of your teeth and gums. Brush your teeth every morning and night with fluoride toothpaste. Floss one time each day.  Stay active. Exercise for at least 30 minutes 5 or more days each week.  Do not use any products that contain nicotine or tobacco, such as cigarettes, e-cigarettes, and chewing tobacco. If you need help quitting, ask your health care provider.  Do not use drugs.  If you are sexually active, practice safe sex. Use a condom or other form of protection in order to prevent STIs (sexually transmitted infections).  Talk with your health care provider about taking a low-dose aspirin or statin.  Find healthy ways to cope with stress, such as: ? Meditation, yoga, or listening to music. ? Journaling. ? Talking to a trusted person. ? Spending time with friends and family. Safety  Always wear your seat belt while driving or riding in a vehicle.  Do not drive: ? If you have been drinking alcohol. Do not ride with someone who has been drinking. ? When you are tired or distracted. ? While texting.  Wear a helmet and other protective equipment during sports activities.  If you have firearms in your house, make sure you follow all gun safety procedures. What's next?  Visit your health care provider once a year for an annual wellness visit.  Ask your health care provider how often you should have your eyes and teeth checked.  Stay up to date on all vaccines. This information is not intended to replace advice given to you by your health care provider. Make sure you discuss any questions you have with your health care provider. Document Revised: 11/12/2020 Document Reviewed: 11/16/2018 Elsevier Patient Education  2021 [employer]  

## 2021-04-03 ENCOUNTER — Encounter: Payer: Self-pay | Admitting: Physician Assistant

## 2021-04-03 ENCOUNTER — Other Ambulatory Visit: Payer: Self-pay

## 2021-04-03 ENCOUNTER — Ambulatory Visit (INDEPENDENT_AMBULATORY_CARE_PROVIDER_SITE_OTHER): Payer: PPO | Admitting: Family Medicine

## 2021-04-03 ENCOUNTER — Encounter: Payer: Self-pay | Admitting: Family Medicine

## 2021-04-03 VITALS — BP 142/76 | HR 69 | Temp 98.3°F | Resp 16 | Ht 68.0 in | Wt 188.4 lb

## 2021-04-03 DIAGNOSIS — G629 Polyneuropathy, unspecified: Secondary | ICD-10-CM

## 2021-04-03 DIAGNOSIS — E7800 Pure hypercholesterolemia, unspecified: Secondary | ICD-10-CM | POA: Diagnosis not present

## 2021-04-03 DIAGNOSIS — Z1159 Encounter for screening for other viral diseases: Secondary | ICD-10-CM | POA: Diagnosis not present

## 2021-04-03 DIAGNOSIS — E119 Type 2 diabetes mellitus without complications: Secondary | ICD-10-CM | POA: Insufficient documentation

## 2021-04-03 DIAGNOSIS — I482 Chronic atrial fibrillation, unspecified: Secondary | ICD-10-CM

## 2021-04-03 DIAGNOSIS — Z0000 Encounter for general adult medical examination without abnormal findings: Secondary | ICD-10-CM

## 2021-04-03 DIAGNOSIS — I10 Essential (primary) hypertension: Secondary | ICD-10-CM

## 2021-04-03 DIAGNOSIS — R739 Hyperglycemia, unspecified: Secondary | ICD-10-CM

## 2021-04-03 DIAGNOSIS — D6869 Other thrombophilia: Secondary | ICD-10-CM

## 2021-04-03 DIAGNOSIS — Z1231 Encounter for screening mammogram for malignant neoplasm of breast: Secondary | ICD-10-CM | POA: Diagnosis not present

## 2021-04-03 DIAGNOSIS — E559 Vitamin D deficiency, unspecified: Secondary | ICD-10-CM

## 2021-04-03 DIAGNOSIS — R7303 Prediabetes: Secondary | ICD-10-CM

## 2021-04-03 NOTE — Progress Notes (Signed)
Annual Wellness Visit     Patient: Teresa Harris, Female    DOB: 27-Jul-1941, 80 y.o.   MRN: [id number] Visit Date: 04/03/2021  Today's Provider: Lavon Paganini, MD   Chief Complaint  Patient presents with  . Medicare [place] is a 80 y.o. female who presents today for her Annual Wellness Visit. She reports consuming a general diet. Patient reports that she is staying active exercising 3x a week She generally feels well. She reports sleeping fairly well. She does not have additional problems to discuss today.   HPI  Neuropathy  She is concerned about the neuropathy in her feet. She wonders if chiropractic care can be helpful.  She states that the Lyrica that she is taking alleviates her symptoms. Hypertension Previously she was diagnosed with being pre-diabetic. She is currently taking Lasix. She states that believes this is because she eats too much bread and mayonnaise.  Atrial Fibrillation She currently taking metoprolol, digoxin, Eliquis to manage her atrial fibrillation.     11/11/20 Mammogram-BI-RADS 1 02/08/17 BMD-Osteopenia 12/29/09 Colonoscopy-internal hemorrhoids, diverticulosis,  Patient Active Problem List   Diagnosis Date Noted  . Prediabetes 04/03/2021  . Acquired thrombophilia ([place]) 04/03/2021  . Hemorrhage of varicose veins of lower extremity, right 09/03/2020  . Cataract, nuclear sclerotic, both eyes 10/06/2018  . Neuropathy 08/17/2018  . Primary open-angle glaucoma, left eye, severe stage 04/19/2018  . Primary insomnia 01/27/2018  . Osteopenia 11/18/2015  . HLD (hyperlipidemia) 11/18/2015  . Post menopausal syndrome 11/18/2015  . 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 Atrial fibrillation, chronic ([place]) 11/18/2015  . Essential hypertension 06/23/2015  . Arthritis 05/22/2015  . Internal hemorrhoid 11/23/2014  . Rectal bleeding 11/23/2014  . Left-sided low back pain with left-sided sciatica 10/25/2014  .  Hemorrhoid 10/03/2014  . Bursitis, trochanteric 07/16/2014  . Left lumbar radiculitis 07/16/2014  . Chronic infection of sinus 08/10/2013  . Glaucoma 08/10/2013  . Osteoarthritis 08/10/2013  . PONV (postoperative nausea and vomiting) 08/10/2013   Social History   Tobacco Use  . Smoking status: Former Smoker    Packs/day: 1.00    Years: 30.00    Pack years: 30.00    Types: Cigarettes    Quit date: 02/09/1996    Years since quitting: 25.1  . Smokeless tobacco: Never Used  Vaping Use  . Vaping Use: Never used  Substance Use Topics  . Alcohol use: Yes    Comment: maybe twice a year- 1 mixed drink  . Drug use: No   Allergies  Allergen Reactions  . [name] [Moxifloxacin Hcl In Nacl] Other (See Comments)    Headache  . Bimatoprost Other (See Comments)    Made my eyes red, and hurt  . Brimonidine Other (See Comments)    Made my eyes red, and hurt Burned eyes.  . Eszopiclone   . Mobic [Meloxicam] Other (See Comments)    headache  . Propofol Other (See Comments)    Had difficulty waking up Difficult to wake up.  . Travoprost Other (See Comments)    Unknown  . Vicodin [Hydrocodone-Acetaminophen] Nausea And Vomiting and Other (See Comments)    Sick on stomach  . Cephalexin Nausea And Vomiting and Other (See Comments)    headache       Medications: Outpatient Medications Prior to Visit  Medication Sig  . Calcium Carbonate-Vitamin D (CALCIUM-VITAMIN D3 PO) Take 1 tablet by mouth daily. Calcium 600 = D3 1000  . celecoxib (CELEBREX) 200 MG  capsule TAKE 1 CAPSULE BY MOUTH TWICE DAILY  . Cholecalciferol (VITAMIN D3) 50 MCG (2000 UT) capsule Take by mouth in the morning and at bedtime.  . cycloSPORINE (RESTASIS) 0.05 % ophthalmic emulsion Place 1 drop into both eyes 2 (two) times daily.   . digoxin (LANOXIN) 0.125 MG tablet Take 1 tablet by mouth once every morning.  . dorzolamide-timolol (COSOPT) 22.3-6.8 MG/ML ophthalmic solution Place 1 drop into both eyes 2 (two) times daily.    Marland Kitchen ELIQUIS 5 MG TABS tablet TAKE 1 TABLET BY MOUTH TWICE DAILY  . fenofibrate (TRICOR) 145 MG tablet Take 1 tablet (145 mg total) by mouth daily.  . furosemide (LASIX) 20 MG tablet TAKE 1 TABLET BY MOUTH ONCE DAILY  . latanoprost (XALATAN) 0.005 % ophthalmic solution Place 1 drop into both eyes daily.   Marland Kitchen LYRICA 100 MG capsule TAKE 1 CAPSULE BY MOUTH ONCE DAILY  . Magnesium 400 MG CAPS Take 400 mg by mouth daily.   . metoprolol succinate (TOPROL-XL) 25 MG 24 hr tablet Take 0.5 tablets (12.5 mg total) by mouth daily.  Marland Kitchen METRONIDAZOLE, TOPICAL, 0.75 % LOTN Apply topically daily. Apply to face in AM  . Multiple Vitamin (MULTIVITAMIN) tablet Take 1 tablet by mouth daily.  . NON FORMULARY Place 12 sprays into both nostrils 6 (six) times daily. 250GU irrigant  . Omega-3 Fatty Acids (FISH OIL) 1200 MG CAPS Take by mouth 2 (two) times daily.  Marland Kitchen omeprazole (PRILOSEC) 40 MG capsule Take 40 mg by mouth every morning.   Marland Kitchen PREMARIN 0.625 MG tablet TAKE 1 TABLET BY MOUTH ONCE DAILY  . SOOLANTRA 1 % CREA Apply 1 application topically daily. Sometimes uses twice daily as needed for flare ups.  . [DISCONTINUED] erythromycin ophthalmic ointment Apply to sutures 4 times a day for 10-12 days.  Discontinue if allergy develops and call our office (Patient not taking: Reported on 04/03/2021)  . [DISCONTINUED] traMADol (ULTRAM) 50 MG tablet Take 1 every 4-6 hours as needed for pain not controlled by Tylenol (Patient not taking: Reported on 04/03/2021)   No facility-administered medications prior to visit.    Allergies  Allergen Reactions  . [name] [Moxifloxacin Hcl In Nacl] Other (See Comments)    Headache  . Bimatoprost Other (See Comments)    Made my eyes red, and hurt  . Brimonidine Other (See Comments)    Made my eyes red, and hurt Burned eyes.  . Eszopiclone   . Mobic [Meloxicam] Other (See Comments)    headache  . Propofol Other (See Comments)    Had difficulty waking up Difficult to wake up.  .  Travoprost Other (See Comments)    Unknown  . Vicodin [Hydrocodone-Acetaminophen] Nausea And Vomiting and Other (See Comments)    Sick on stomach  . Cephalexin Nausea And Vomiting and Other (See Comments)    headache    Patient Care Team: Virginia Crews, MD as PCP - General (Family Medicine) Wellington Hampshire, MD as PCP - Cardiology (Cardiology) Wellington Hampshire, MD as Consulting Physician (Cardiology) Eulogio Bear, MD as Consulting Physician (Ophthalmology) Sharlet Salina, MD as Referring Physician (Physical Medicine and Rehabilitation) Beverly Gust, MD (Otolaryngology)  Review of Systems  Constitutional: Negative for chills, fatigue and fever.  HENT: Positive for sinus pressure. Negative for congestion, ear pain, rhinorrhea, sinus pain and sore throat.   Eyes: Positive for photophobia, pain, redness and itching.  Respiratory: Negative for cough, shortness of breath and wheezing.   Cardiovascular: Negative for chest pain and leg  swelling.  Gastrointestinal: Negative for abdominal pain, blood in stool, diarrhea, nausea and vomiting.  Endocrine: Positive for heat intolerance.  Genitourinary: Negative for dysuria, flank pain, frequency and urgency.  Musculoskeletal: Positive for neck pain.  Neurological: Negative for dizziness and headaches.    Last CBC Lab Results  Component Value Date   WBC 4.8 11/26/2020   HGB 16.0 (H) 11/26/2020   HCT 46.7 (H) 11/26/2020   MCV 96 11/26/2020   MCH 33.0 11/26/2020   RDW 12.4 11/26/2020   PLT 171 [id number]   Last metabolic panel Lab Results  Component Value Date   GLUCOSE 131 (H) 11/26/2020   NA 141 11/26/2020   K 4.8 11/26/2020   CL 100 11/26/2020   CO2 25 11/26/2020   BUN 22 11/26/2020   CREATININE 0.96 11/26/2020   GFRNONAA 57 (L) 11/26/2020   GFRAA 66 11/26/2020   CALCIUM 9.6 11/26/2020   PROT 7.4 07/17/2020   ALBUMIN 3.8 07/17/2020   LABGLOB 3.1 04/16/2020   AGRATIO 1.2 04/16/2020   BILITOT 0.9  07/17/2020   ALKPHOS 39 07/17/2020   AST 25 07/17/2020   ALT 30 07/17/2020   ANIONGAP 10 07/17/2020   Last lipids Lab Results  Component Value Date   CHOL 210 (H) 07/17/2020   HDL 57 07/17/2020   LDLCALC 113 (H) 07/17/2020   TRIG 199 (H) 07/17/2020   CHOLHDL 3.7 07/17/2020   Last thyroid functions Lab Results  Component Value Date   TSH 1.901 12/07/2018        Objective    Vitals: BP (!) 142/76   Pulse 69   Temp 98.3 F (36.8 C) (Oral)   Resp 16   Ht 5\' 8"  (1.727 m)   Wt 188 lb 6.4 oz (85.5 kg)   SpO2 94%   BMI 28.65 kg/m  BP Readings from Last 3 Encounters:  04/03/21 (!) 142/76  12/19/20 117/64  11/26/20 120/80   Wt Readings from Last 3 Encounters:  04/03/21 188 lb 6.4 oz (85.5 kg)  12/19/20 189 lb (85.7 kg)  11/26/20 189 lb (85.7 kg)      Physical Exam Vitals reviewed.  Constitutional:      General: She is not in acute distress.    Appearance: Normal appearance. She is well-developed. She is not diaphoretic.  HENT:     Head: Normocephalic and atraumatic.     Nose: Nose normal. No congestion or rhinorrhea.     Mouth/Throat:     Mouth: Mucous membranes are moist.     Pharynx: Oropharynx is clear. No oropharyngeal exudate.  Eyes:     General: No scleral icterus.    Extraocular Movements: Extraocular movements intact.     Conjunctiva/sclera: Conjunctivae normal.     Pupils: Pupils are equal, round, and reactive to light.  Neck:     Thyroid: No thyromegaly.  Cardiovascular:     Rate and Rhythm: Normal rate and regular rhythm.     Pulses: Normal pulses.     Heart sounds: Normal heart sounds. No murmur heard.   Pulmonary:     Effort: Pulmonary effort is normal. No respiratory distress.     Breath sounds: Normal breath sounds. No wheezing or rales.  Abdominal:     General: There is no distension.     Palpations: Abdomen is soft.     Tenderness: There is no abdominal tenderness.  Musculoskeletal:     Cervical back: Neck supple.     Right lower  leg: No edema.     Left lower  leg: No edema.  Lymphadenopathy:     Cervical: No cervical adenopathy.  Skin:    General: Skin is warm and dry.     Findings: No rash.  Neurological:     Mental Status: She is alert and oriented to person, place, and time. Mental status is at baseline.     Gait: Gait normal.  Psychiatric:        Mood and Affect: Mood normal.        Behavior: Behavior normal.        Thought Content: Thought content normal.     Most recent functional status assessment: In your present state of health, do you have any difficulty performing the following activities: 04/03/2021  Hearing? Y  Vision? Y  Difficulty concentrating or making decisions? N  Walking or climbing stairs? N  Dressing or bathing? N  Doing errands, shopping? N  Some recent data might be hidden   Most recent fall risk assessment: Fall Risk  04/03/2021  Falls in the past year? 0  Comment -  Number falls in past yr: 0  Comment -  Injury with Fall? 0    Most recent depression screenings: PHQ 2/9 Scores 04/03/2021 04/03/2021  PHQ - 2 Score 0 0  PHQ- 9 Score 2 -   Most recent cognitive screening: 6CIT Screen 03/17/2020  What Year? 0 points  What month? 0 points  What time? 0 points  Count back from 20 0 points  Months in reverse 0 points  Repeat phrase 0 points  Total Score 0   Most recent Audit-C alcohol use screening Alcohol Use Disorder Test (AUDIT) 04/03/2021  1. How often do you have a drink containing alcohol? 1  2. How many drinks containing alcohol do you have on a typical day when you are drinking? 0  3. How often do you have six or more drinks on one occasion? 0  AUDIT-C Score 1  Alcohol Brief Interventions/Follow-up -   A score of 3 or more in women, and 4 or more in men indicates increased risk for alcohol abuse, EXCEPT if all of the points are from question 1   No results found for any visits on 04/03/21.  Assessment & Plan     Annual wellness visit done today including the  all of the following: Reviewed patient's Family Medical History Reviewed and updated list of patient's medical providers Assessment of cognitive impairment was done Assessed patient's functional ability Established a written schedule for health screening [place] Completed and Reviewed  Exercise Activities and Dietary recommendations Goals    . DIET - INCREASE WATER INTAKE     Recommend increasing water intake to 6-8 glasses a day.     Marland Kitchen DIET - REDUCE CALORIE INTAKE     Recommend to continue current diet plan of cutting down on calories and carbohydrates to help aid in weight loss.        Immunization History  Administered Date(s) Administered  . Fluad Quad(high Dose 65+) 08/29/2019, 10/08/2020  . Influenza, High Dose Seasonal PF 08/26/2016, 09/07/2017, 08/17/2018  . Influenza,inj,Quad PF,6+ Mos 09/09/2014  . Influenza-Unspecified 10/08/2015  . PFIZER(Purple Top)SARS-COV-2 Vaccination 01/02/2020, 01/23/2020, 09/22/2020  . Pneumococcal Conjugate-13 11/11/2014  . Pneumococcal Polysaccharide-23 10/27/2012  . Tdap 12/03/2010    Health Maintenance  Topic Date Due  . Hepatitis C Screening  Never done  . TETANUS/TDAP  12/03/2020  . INFLUENZA VACCINE  07/06/2021  . DEXA SCAN  02/08/2022  . COVID-19 Vaccine  Completed  .  PNA vac Low Risk Adult  Completed  . HPV VACCINES  Aged Out     Discussed health benefits of physical activity, and encouraged her to engage in regular exercise appropriate for her age and condition.    Problem List Items Addressed This Visit      Cardiovascular and Mediastinum   Essential hypertension    Well controlled Continue current medications Followed by cardiology Recheck metabolic panel       Relevant Orders   CBC with Differential/Platelet   Comprehensive metabolic panel   Atrial fibrillation, chronic (HCC)    Chronic and stable In normal sinus rhythm today Followed by cardiology Check CBC as she is on chronic  Eliquis        Nervous and Auditory   Neuropathy    Advised that there is no evidence that chiropractic care can help peripheral neuropathy Continue Lyrica at current dose        Hematopoietic and Hemostatic   Acquired thrombophilia ([place])    Chronic A. fib On Eliquis        Other   HLD (hyperlipidemia)    Reviewed last lipid panel Not currently on a statin Recheck FLP and CMP Discussed diet and exercise       Relevant Orders   Lipid Panel With LDL/HDL Ratio   Prediabetes    Recommend low carb diet Recheck A1c       Relevant Orders   Hemoglobin A1c    Other Visit Diagnoses    Encounter for subsequent annual wellness visit (AWV) in Medicare patient    -  Primary   Relevant Orders   CBC with Differential/Platelet   Comprehensive metabolic panel   Hemoglobin A1c   Hepatitis C antibody   Lipid Panel With LDL/HDL Ratio   VITAMIN D 25 Hydroxy (Vit-D Deficiency, Fractures)   Encounter for annual physical exam       Encounter for hepatitis C screening test for low risk patient       Relevant Orders   Hepatitis C antibody   Encounter for screening mammogram for malignant neoplasm of breast       Relevant Orders   MM 3D SCREEN BREAST BILATERAL       Return in about 1 year (around 04/03/2022) for CPE, AWV.     Frederic Jericho [place],acting as a Education administrator for Lavon Paganini, MD.,have documented all relevant documentation on the behalf of Lavon Paganini, MD,as directed by  Lavon Paganini, MD while in the presence of Lavon Paganini, MD.  I, Lavon Paganini, MD, have reviewed all documentation for this visit. The documentation on 04/03/21 for the exam, diagnosis, procedures, and orders are all accurate and complete.   Abby Stines, Dionne Bucy, MD, MPH [facility]

## 2021-04-03 NOTE — Assessment & Plan Note (Signed)
Reviewed last lipid panel Not currently on a statin Recheck FLP and CMP Discussed diet and exercise  

## 2021-04-03 NOTE — Assessment & Plan Note (Signed)
Advised that there is no evidence that chiropractic care can help peripheral neuropathy Continue Lyrica at current dose

## 2021-04-03 NOTE — Assessment & Plan Note (Signed)
Recommend low carb diet °Recheck A1c  °

## 2021-04-03 NOTE — Assessment & Plan Note (Signed)
Chronic A. fib On Eliquis

## 2021-04-03 NOTE — Assessment & Plan Note (Signed)
Well controlled Continue current medications Followed by cardiology Recheck metabolic panel

## 2021-04-03 NOTE — Assessment & Plan Note (Signed)
Chronic and stable In normal sinus rhythm today Followed by cardiology Check CBC as she is on chronic Eliquis

## 2021-04-04 LAB — COMPREHENSIVE METABOLIC PANEL
ALT: 28 IU/L (ref 0–32)
AST: 26 IU/L (ref 0–40)
Albumin/Globulin Ratio: 1.5 (ref 1.2–2.2)
Albumin: 4.4 g/dL (ref 3.7–4.7)
Alkaline Phosphatase: 59 IU/L (ref 44–121)
BUN/Creatinine Ratio: 18 (ref 12–28)
BUN: 16 mg/dL (ref 8–27)
Bilirubin Total: 0.6 mg/dL (ref 0.0–1.2)
CO2: 26 mmol/L (ref 20–29)
Calcium: 9.9 mg/dL (ref 8.7–10.3)
Chloride: 100 mmol/L (ref 96–106)
Creatinine, Ser: 0.89 mg/dL (ref 0.57–1.00)
Globulin, Total: 2.9 g/dL (ref 1.5–4.5)
Glucose: 136 mg/dL — ABNORMAL HIGH (ref 65–99)
Potassium: 5.2 mmol/L (ref 3.5–5.2)
Sodium: 140 mmol/L (ref 134–144)
Total Protein: 7.3 g/dL (ref 6.0–8.5)
eGFR: 66 mL/min/{1.73_m2} (ref >59)

## 2021-04-04 LAB — CBC WITH DIFFERENTIAL/PLATELET
Basophils Absolute: 0.1 10*3/uL (ref 0.0–0.2)
Basos: 1 %
EOS (ABSOLUTE): 0.1 10*3/uL (ref 0.0–0.4)
Eos: 3 %
Hematocrit: 48.3 % — ABNORMAL HIGH (ref 34.0–46.6)
Hemoglobin: 16 g/dL — ABNORMAL HIGH (ref 11.1–15.9)
Immature Grans (Abs): 0 10*3/uL (ref 0.0–0.1)
Immature Granulocytes: 0 %
Lymphocytes Absolute: 1.3 10*3/uL (ref 0.7–3.1)
Lymphs: 26 %
MCH: 32.1 pg (ref 26.6–33.0)
MCHC: 33.1 g/dL (ref 31.5–35.7)
MCV: 97 fL (ref 79–97)
Monocytes Absolute: 0.5 10*3/uL (ref 0.1–0.9)
Monocytes: 11 %
Neutrophils Absolute: 2.9 10*3/uL (ref 1.4–7.0)
Neutrophils: 59 %
Platelets: 175 10*3/uL (ref 150–450)
RBC: 4.99 x10E6/uL (ref 3.77–5.28)
RDW: 12.3 % (ref 11.7–15.4)
WBC: 4.8 10*3/uL (ref 3.4–10.8)

## 2021-04-04 LAB — LIPID PANEL WITH LDL/HDL RATIO
Cholesterol, Total: 189 mg/dL (ref 100–199)
HDL: 41 mg/dL (ref >39)
LDL Chol Calc (NIH): 105 mg/dL — ABNORMAL HIGH (ref 0–99)
LDL/HDL Ratio: 2.6 ratio (ref 0.0–3.2)
Triglycerides: 249 mg/dL — ABNORMAL HIGH (ref 0–149)
VLDL Cholesterol Cal: 43 mg/dL — ABNORMAL HIGH (ref 5–40)

## 2021-04-04 LAB — HEMOGLOBIN A1C
Est. average glucose Bld gHb Est-mCnc: 160 mg/dL
Hgb A1c MFr Bld: 7.2 % — ABNORMAL HIGH (ref 4.8–5.6)

## 2021-04-04 LAB — VITAMIN D 25 HYDROXY (VIT D DEFICIENCY, FRACTURES): Vit D, 25-Hydroxy: 67.6 ng/mL (ref 30.0–100.0)

## 2021-04-04 LAB — HEPATITIS C ANTIBODY: Hep C Virus Ab: <0.1 s/co ratio (ref 0.0–0.9)

## 2021-04-09 DIAGNOSIS — H16223 Keratoconjunctivitis sicca, not specified as Sjogren's, bilateral: Secondary | ICD-10-CM | POA: Diagnosis not present

## 2021-04-18 ENCOUNTER — Other Ambulatory Visit: Payer: Self-pay | Admitting: Cardiovascular Disease

## 2021-04-20 NOTE — Telephone Encounter (Signed)
Prescription refill request for Eliquis received. Indication: Atrial fib Last office visit: 11/26/20 Scr: 0.89 on 04/03/21 Age: 80 Weight: 85.7kg  Based on above findings Eliquis 5mg  twice daily is the appropriate dose.  Refill approved.

## 2021-04-20 NOTE — Telephone Encounter (Signed)
Refill request

## 2021-04-27 ENCOUNTER — Other Ambulatory Visit: Payer: Self-pay

## 2021-04-27 MED ORDER — DIGOXIN 125 MCG PO TABS: ORAL_TABLET | ORAL | 0 refills | Status: DC

## 2021-05-08 DIAGNOSIS — H401133 Primary open-angle glaucoma, bilateral, severe stage: Secondary | ICD-10-CM | POA: Diagnosis not present

## 2021-05-09 ENCOUNTER — Other Ambulatory Visit: Payer: Self-pay | Admitting: Physician Assistant

## 2021-05-09 DIAGNOSIS — N951 Menopausal and female climacteric states: Secondary | ICD-10-CM

## 2021-05-12 ENCOUNTER — Telehealth: Payer: Self-pay

## 2021-05-12 NOTE — Telephone Encounter (Signed)
Pt is calling and leaving town tomorrow and would like refill

## 2021-05-12 NOTE — Telephone Encounter (Signed)
Patient advised of lab results. Fu scheduled.

## 2021-05-12 NOTE — Telephone Encounter (Signed)
Requested medication (s) are due for refill today: expired medication  Requested medication (s) are on the active medication list: yes  Last refill:  05/06/20 #90 3 refills  Future visit scheduled: no seen 1 month ago AWV per medicare  Notes to clinic:  expired medication. Patient requesting refill prior to leaving town tomorrow      Requested Prescriptions  Pending Prescriptions Disp Refills   PREMARIN 0.625 MG tablet [Pharmacy Med Name: PREMARIN 0.625 MG TAB] 90 tablet 3    Sig: TAKE 1 TABLET BY MOUTH ONCE DAILY      OB/GYN:  Estrogens Failed - 05/12/2021  2:26 PM      Failed - Mammogram is up-to-date per Health Maintenance      Failed - Last BP in normal range    BP Readings from Last 1 Encounters:  04/03/21 (!) 142/76          Failed - Valid encounter within last 12 months    Recent Outpatient Visits           1 month ago Encounter for subsequent annual wellness visit (AWV) in Medicare patient   [facility], Dionne Bucy, MD   1 year ago Annual physical exam   [place], Clearnce Sorrel, [place]   1 year ago Adverse effect of vaccine, initial encounter   [place], [place]   1 year ago Acute non-recurrent pansinusitis   [facility], Clearnce Sorrel, [place]   2 years ago Annual physical exam   [facility] [place], [place], [place]

## 2021-05-12 NOTE — Telephone Encounter (Signed)
Copied from [place] [id number]. Topic: Quick Communication - Lab Results (Clinic Use ONLY) >> May 12, 2021  2:27 PM Lennox Solders wrote: Pt is calling and would like last blood work results

## 2021-05-25 ENCOUNTER — Other Ambulatory Visit: Payer: Self-pay | Admitting: *Deleted

## 2021-05-25 MED ORDER — FENOFIBRATE 145 MG PO TABS: 145.0000 mg | ORAL_TABLET | Freq: Every day | ORAL | 0 refills | Status: DC

## 2021-05-25 NOTE — Telephone Encounter (Signed)
Scheduled

## 2021-05-27 ENCOUNTER — Encounter: Payer: Self-pay | Admitting: Podiatry

## 2021-05-27 ENCOUNTER — Ambulatory Visit: Payer: PPO | Admitting: Podiatry

## 2021-05-27 ENCOUNTER — Other Ambulatory Visit: Payer: Self-pay

## 2021-05-27 DIAGNOSIS — B351 Tinea unguium: Secondary | ICD-10-CM | POA: Diagnosis not present

## 2021-05-27 DIAGNOSIS — D2372 Other benign neoplasm of skin of left lower limb, including hip: Secondary | ICD-10-CM | POA: Diagnosis not present

## 2021-05-27 DIAGNOSIS — M79676 Pain in unspecified toe(s): Secondary | ICD-10-CM | POA: Diagnosis not present

## 2021-05-27 NOTE — Progress Notes (Signed)
Subjective:  Patient ID: Teresa Harris, female    DOB: [birth year],  MRN: [id number] HPI Chief Complaint  Patient presents with   Toe Pain    Hallux left - toenail loosened and callused area tip of toe    80 y.o. female presents with the above complaint.   ROS: Denies fever chills nausea vomiting muscle aches pains calf pain back pain chest pain shortness of breath.  Past Medical History:  Diagnosis Date   (HFpEF) heart failure with preserved ejection fraction ([place])    a. 12/2018 Echo: EF 60-65%, no rwma, mild MR. Mildly dil LA. Nl RV fxn. PASP 3mmHg.   Adverse effect of anesthesia 11/18/2015   Arthritis    Coccyalgia 3/71/6967   Complication of anesthesia    trouble waking up with Propofol   Depression    h/o   Family history of adverse reaction to anesthesia    daughter PONV   Glaucoma    left eye   Headache    H/O MIGRAINES   Hemorrhoids    Hiatal hernia    History of kidney stones    History of migraine headaches    History of stress test    a. 10/2014 MV: No ischemia.   Hx MRSA infection    Hypercholesteremia    Motion sickness    boats   Neuropathy    feet, fingers   Osteopenia    Permanent atrial fibrillation ([place]) 10/01/2014   a. CHA2DS2VASc = 3-->eliquis.   PONV (postoperative nausea and vomiting)    Wears dentures    full upper, partial lower   Wears hearing aid in both ears    Past Surgical History:  Procedure Laterality Date   ABDOMINAL HYSTERECTOMY     AUGMENTATION MAMMAPLASTY Bilateral 2004   BROW LIFT Bilateral 12/19/2020   Procedure: BLEPHAROPLASTY UPPER EYELID; W/EXCESS SKIN BLEPHAROPTOSIS REPAIR; RESECT EX BILATERAL;  Surgeon: Karle Starch, MD;  Location: [facility];  Service: Ophthalmology;  Laterality: Bilateral;   CARPAL TUNNEL RELEASE Right 02/16/2016   Procedure: CARPAL TUNNEL RELEASE;  Surgeon: Dereck Leep, MD;  Location: [facility];  Service: Orthopedics;  Laterality: Right;   CARPAL TUNNEL RELEASE Left 04/12/2016    Procedure: CARPAL TUNNEL RELEASE;  Surgeon: Dereck Leep, MD;  Location: [facility];  Service: Orthopedics;  Laterality: Left;   CARPAL TUNNEL RELEASE Right 03/13/2018   Procedure: CARPAL TUNNEL RELEASE;  Surgeon: Deetta Perla, MD;  Location: [facility];  Service: Neurosurgery;  Laterality: Right;   CATARACT EXTRACTION, BILATERAL     COLONOSCOPY  12/29/09   Dr Dionne Milo   EYE SURGERY Left    shunt placed   EYE SURGERY Left    tube placed   FOOT SURGERY     bilateral    JOINT REPLACEMENT     NASAL SEPTUM SURGERY     REPLACEMENT TOTAL KNEE     bilateral   SHOULDER SURGERY     right shoulder    Current Outpatient Medications:    Calcium Carbonate-Vitamin D (CALCIUM-VITAMIN D3 PO), Take 1 tablet by mouth daily. Calcium 600 = D3 1000, Disp: , Rfl:    celecoxib (CELEBREX) 200 MG capsule, TAKE 1 CAPSULE BY MOUTH TWICE DAILY, Disp: 180 capsule, Rfl: 0   Cholecalciferol (VITAMIN D3) 50 MCG (2000 UT) capsule, Take by mouth in the morning and at bedtime., Disp: , Rfl:    cycloSPORINE (RESTASIS) 0.05 % ophthalmic emulsion, Place 1 drop into both eyes 2 (two) times daily. , Disp: , Rfl:  digoxin (LANOXIN) 0.125 MG tablet, Take 1 tablet by mouth once every morning. PLEASE CALL TO SCHEDULE OFFICE VISIT FOR FURTHER REFILLS. THANK YOU!, Disp: 30 tablet, Rfl: 0   dorzolamide-timolol (COSOPT) 22.3-6.8 MG/ML ophthalmic solution, Place 1 drop into both eyes 2 (two) times daily. , Disp: , Rfl:    ELIQUIS 5 MG TABS tablet, TAKE 1 TABLET BY MOUTH TWICE DAILY, Disp: 180 tablet, Rfl: 1   fenofibrate (TRICOR) 145 MG tablet, Take 1 tablet (145 mg total) by mouth daily., Disp: 90 tablet, Rfl: 0   furosemide (LASIX) 20 MG tablet, TAKE 1 TABLET BY MOUTH ONCE DAILY, Disp: 90 tablet, Rfl: 1   latanoprost (XALATAN) 0.005 % ophthalmic solution, Place 1 drop into both eyes daily. , Disp: , Rfl:    LYRICA 100 MG capsule, TAKE 1 CAPSULE BY MOUTH ONCE DAILY, Disp: 90 capsule, Rfl: 5   Magnesium 400 MG CAPS, Take 400 mg by  mouth daily. , Disp: , Rfl:    metoprolol succinate (TOPROL-XL) 25 MG 24 hr tablet, Take 0.5 tablets (12.5 mg total) by mouth daily., Disp: 45 tablet, Rfl: 1   METRONIDAZOLE, TOPICAL, 0.75 % LOTN, Apply topically daily. Apply to face in AM, Disp: , Rfl:    Multiple Vitamin (MULTIVITAMIN) tablet, Take 1 tablet by mouth daily., Disp: , Rfl:    NON FORMULARY, Place 12 sprays into both nostrils 6 (six) times daily. 250GU irrigant, Disp: , Rfl:    Omega-3 Fatty Acids (FISH OIL) 1200 MG CAPS, Take by mouth 2 (two) times daily., Disp: , Rfl:    omeprazole (PRILOSEC) 40 MG capsule, Take 40 mg by mouth every morning. , Disp: , Rfl:    PREMARIN 0.625 MG tablet, TAKE 1 TABLET BY MOUTH ONCE DAILY, Disp: 90 tablet, Rfl: 0   SOOLANTRA 1 % CREA, Apply 1 application topically daily. Sometimes uses twice daily as needed for flare ups., Disp: , Rfl:   Allergies  Allergen Reactions   [name] [Moxifloxacin Hcl In Nacl] Other (See Comments)    Headache   Bimatoprost Other (See Comments)    Made my eyes red, and hurt   Brimonidine Other (See Comments)    Made my eyes red, and hurt Burned eyes.   Eszopiclone    Mobic [Meloxicam] Other (See Comments)    headache   Propofol Other (See Comments)    Had difficulty waking up Difficult to wake up.   Travoprost Other (See Comments)    Unknown   Vicodin [Hydrocodone-Acetaminophen] Nausea And Vomiting and Other (See Comments)    Sick on stomach   Cephalexin Nausea And Vomiting and Other (See Comments)    headache   Review of Systems Objective:  There were no vitals filed for this visit.  General: Well developed, nourished, in no acute distress, alert and oriented x3   Dermatological: Skin is warm, dry and supple bilateral. Nails x 10 are well maintained; remaining integument appears unremarkable at this time. There are no open sores, no preulcerative lesions, no rash or signs of infection present.  Hallux nail left demonstrates a distal onycholysis with a  reactive hyper keratoma distal aspect of the toe.  No open lesions or wounds.  Most likely nail dystrophy and the toe appears to be rubbing his shoe.  Vascular: Dorsalis Pedis artery and Posterior Tibial artery pedal pulses are 2/4 bilateral with immedate capillary fill time. Pedal hair growth present. No varicosities and no lower extremity edema present bilateral.   Neruologic: Grossly intact via light touch bilateral. Vibratory intact  via tuning fork bilateral. Protective threshold with Semmes Wienstein monofilament intact to all pedal sites bilateral. Patellar and Achilles deep tendon reflexes 2+ bilateral. No Babinski or clonus noted bilateral.   Musculoskeletal: No gross boney pedal deformities bilateral. No pain, crepitus, or limitation noted with foot and ankle range of motion bilateral. Muscular strength 5/5 in all groups tested bilateral.  Gait: Unassisted, Nonantalgic.    Radiographs:  None taken  Assessment & Plan:   Assessment: Nail dystrophy with distal onycholysis loosening of the nail.  Extended hallux at the level of the interphalangeal joint and chronic area of hypertrophic tissue distal tuft of the toe.    Plan: Debridement of the nail and the hyperkeratotic tissue.     Darienne Belleau T. [place], [place]

## 2021-06-03 ENCOUNTER — Other Ambulatory Visit: Payer: Self-pay | Admitting: Family Medicine

## 2021-06-16 ENCOUNTER — Other Ambulatory Visit: Payer: Self-pay | Admitting: Cardiovascular Disease

## 2021-06-23 ENCOUNTER — Other Ambulatory Visit: Payer: Self-pay | Admitting: Nurse Practitioner

## 2021-06-24 DIAGNOSIS — J329 Chronic sinusitis, unspecified: Secondary | ICD-10-CM | POA: Diagnosis not present

## 2021-06-24 DIAGNOSIS — H902 Conductive hearing loss, unspecified: Secondary | ICD-10-CM | POA: Diagnosis not present

## 2021-06-24 DIAGNOSIS — H6123 Impacted cerumen, bilateral: Secondary | ICD-10-CM | POA: Diagnosis not present

## 2021-06-26 ENCOUNTER — Encounter: Payer: Self-pay | Admitting: Cardiovascular Disease

## 2021-06-26 ENCOUNTER — Other Ambulatory Visit: Payer: Self-pay

## 2021-06-26 ENCOUNTER — Ambulatory Visit: Payer: PPO | Admitting: Cardiovascular Disease

## 2021-06-26 VITALS — BP 140/90 | HR 58 | Ht 68.0 in | Wt 186.4 lb

## 2021-06-26 DIAGNOSIS — I5032 Chronic diastolic (congestive) heart failure: Secondary | ICD-10-CM | POA: Diagnosis not present

## 2021-06-26 DIAGNOSIS — I482 Chronic atrial fibrillation, unspecified: Secondary | ICD-10-CM | POA: Diagnosis not present

## 2021-06-26 MED ORDER — METOPROLOL SUCCINATE ER 25 MG PO TB24: 12.5000 mg | ORAL_TABLET | Freq: Every day | ORAL | 1 refills | Status: DC

## 2021-06-26 NOTE — Patient Instructions (Signed)
Medication Instructions:  Your physician has recommended you make the following change in your medication:   STOP Digoxin   *If you need a refill on your cardiac medications before your next appointment, please call your pharmacy*   Lab Work: None  If you have labs (blood work) drawn today and your tests are completely normal, you will receive your results only by: [place] (if you have MyChart) OR A paper copy in the mail If you have any lab test that is abnormal or we need to change your treatment, we will call you to review the results.   Testing/Procedures: None   Follow-Up: At [facility], you and your health needs are our priority.  As part of our continuing mission to provide you with exceptional heart care, we have created designated Provider Care Teams.  These Care Teams include your primary Cardiologist (physician) and Advanced Practice Providers (APPs -  Physician Assistants and Nurse Practitioners) who all work together to provide you with the care you need, when you need it.  We recommend signing up for the patient portal called "MyChart".  Sign up information is provided on this After Visit Summary.  MyChart is used to connect with patients for Virtual Visits (Telemedicine).  Patients are able to view lab/test results, encounter notes, upcoming appointments, etc.  Non-urgent messages can be sent to your provider as well.   To learn more about what you can do with MyChart, go to [email or web address].    Your next appointment:   6 month(s)  The format for your next appointment:   In Person  Provider:   Kathlyn Sacramento, MD

## 2021-06-26 NOTE — Progress Notes (Signed)
Cardiology Office Note   Date:  06/26/2021   ID:  Teresa Harris, DOB 1941/08/28, MRN [id number]  PCP:  Virginia Crews, MD  Cardiologist:   Kathlyn Sacramento, MD   Chief Complaint  Patient presents with   Other    OD 6 month f/u pt would like to have her metoprolol succ. Filled by our office. Meds reviewed verbally with pt.      History of Present Illness: Teresa Harris is a 80 y.o. female who presents for a follow-up regarding chronic atrial fibrillation.  She has borderline diabetes and hyperlipidemia not requiring medications. She smoked in the past more than 30 years ago. There is no family history of coronary artery disease or arrhythmia.  Nuclear stress test in November 2015 which showed no evidence of ischemia with normal ejection fraction. Echocardiogram showed normal EF with mild to moderate mitral regurgitation and mildly dilated left atrium.  She is only able to tolerate small dose metoprolol due to extreme fatigue.  Digoxin is being used for rate control.    She did have mild diastolic heart failure early this year that required adding small dose furosemide.  Most recent echocardiogram in January 2020 showed an EF of 60 to 65% with mild pulmonary hypertension.  She has been doing reasonably well with no recent chest pain, palpitations or worsening dyspnea.  Past Medical History:  Diagnosis Date   (HFpEF) heart failure with preserved ejection fraction ([place])    a. 12/2018 Echo: EF 60-65%, no rwma, mild MR. Mildly dil LA. Nl RV fxn. PASP 82mHg.   Adverse effect of anesthesia 11/18/2015   Arthritis    Coccyalgia [id number]  Complication of anesthesia    trouble waking up with Propofol   Depression    h/o   Family history of adverse reaction to anesthesia    daughter PONV   Glaucoma    left eye   Headache    H/O MIGRAINES   Hemorrhoids    Hiatal hernia    History of kidney stones    History of migraine headaches    History of stress test    a.  10/2014 MV: No ischemia.   Hx MRSA infection    Hypercholesteremia    Motion sickness    boats   Neuropathy    feet, fingers   Osteopenia    Permanent atrial fibrillation ([place] 10/01/2014   a. CHA2DS2VASc = 3-->eliquis.   PONV (postoperative nausea and vomiting)    Wears dentures    full upper, partial lower   Wears hearing aid in both ears     Past Surgical History:  Procedure Laterality Date   ABDOMINAL HYSTERECTOMY     AUGMENTATION MAMMAPLASTY Bilateral 2004   BROW LIFT Bilateral 12/19/2020   Procedure: BLEPHAROPLASTY UPPER EYELID; W/EXCESS SKIN BLEPHAROPTOSIS REPAIR; RESECT EX BILATERAL;  Surgeon: FKarle Starch MD;  Location: [facility]  Service: Ophthalmology;  Laterality: Bilateral;   CARPAL TUNNEL RELEASE Right 02/16/2016   Procedure: CARPAL TUNNEL RELEASE;  Surgeon: JDereck Leep MD;  Location: [facility];  Service: Orthopedics;  Laterality: Right;   CARPAL TUNNEL RELEASE Left 04/12/2016   Procedure: CARPAL TUNNEL RELEASE;  Surgeon: JDereck Leep MD;  Location: [facility];  Service: Orthopedics;  Laterality: Left;   CARPAL TUNNEL RELEASE Right 03/13/2018   Procedure: CARPAL TUNNEL RELEASE;  Surgeon: CDeetta Perla MD;  Location: [facility];  Service: Neurosurgery;  Laterality: Right;   CATARACT EXTRACTION, BILATERAL     COLONOSCOPY  12/29/09   Dr Dionne Milo   EYE SURGERY Left    shunt placed   EYE SURGERY Left    tube placed   FOOT SURGERY     bilateral    JOINT REPLACEMENT     NASAL SEPTUM SURGERY     REPLACEMENT TOTAL KNEE     bilateral   SHOULDER SURGERY     right shoulder     Current Outpatient Medications  Medication Sig Dispense Refill   Calcium Carbonate-Vitamin D (CALCIUM-VITAMIN D3 PO) Take 1 tablet by mouth daily. Calcium 600 = D3 1000     celecoxib (CELEBREX) 200 MG capsule TAKE 1 CAPSULE BY MOUTH TWICE DAILY 180 capsule 2   Cholecalciferol (VITAMIN D3) 50 MCG (2000 UT) capsule Take by mouth in the morning and at bedtime.     cycloSPORINE  (RESTASIS) 0.05 % ophthalmic emulsion Place 1 drop into both eyes 2 (two) times daily.      digoxin (LANOXIN) 0.125 MG tablet TAKE 1 TABLET BY MOUTH ONCE EVERY MORNING. 90 tablet 0   dorzolamide-timolol (COSOPT) 22.3-6.8 MG/ML ophthalmic solution Place 1 drop into both eyes 2 (two) times daily.      ELIQUIS 5 MG TABS tablet TAKE 1 TABLET BY MOUTH TWICE DAILY 180 tablet 1   fenofibrate (TRICOR) 145 MG tablet Take 1 tablet (145 mg total) by mouth daily. 90 tablet 0   furosemide (LASIX) 20 MG tablet TAKE 1 TABLET BY MOUTH ONCE DAILY 90 tablet 1   latanoprost (XALATAN) 0.005 % ophthalmic solution Place 1 drop into both eyes daily.      LYRICA 100 MG capsule TAKE 1 CAPSULE BY MOUTH ONCE DAILY 90 capsule 5   Magnesium 400 MG CAPS Take 400 mg by mouth daily.      metoprolol succinate (TOPROL-XL) 25 MG 24 hr tablet Take 0.5 tablets (12.5 mg total) by mouth daily. 45 tablet 1   METRONIDAZOLE, TOPICAL, 0.75 % LOTN Apply topically daily. Apply to face in AM     Multiple Vitamin (MULTIVITAMIN) tablet Take 1 tablet by mouth daily.     NON FORMULARY Place 12 sprays into both nostrils 6 (six) times daily. 250GU irrigant     Omega-3 Fatty Acids (FISH OIL) 1200 MG CAPS Take by mouth 2 (two) times daily.     omeprazole (PRILOSEC) 40 MG capsule Take 40 mg by mouth every morning.      PREMARIN 0.625 MG tablet TAKE 1 TABLET BY MOUTH ONCE DAILY 90 tablet 0   SOOLANTRA 1 % CREA Apply 1 application topically daily. Sometimes uses twice daily as needed for flare ups.     No current facility-administered medications for this visit.    Allergies:   [name] [moxifloxacin hcl in nacl], Bimatoprost, Brimonidine, Eszopiclone, Mobic [meloxicam], Propofol, Travoprost, Vicodin [hydrocodone-acetaminophen], and Cephalexin    Social History:  The patient  reports that she quit smoking about 25 years ago. Her smoking use included cigarettes. She has a 30.00 pack-year smoking history. She has never used smokeless tobacco. She  reports current alcohol use. She reports that she does not use drugs.   Family History:  The patient's family history includes Arthritis in her father and mother.    ROS:  Please see the history of present illness.   Otherwise, review of systems are positive for none.   All other systems are reviewed and negative.    PHYSICAL EXAM: VS:  BP 140/90 (BP Location: Left Arm, Patient Position: Sitting, Cuff Size: Normal)   Pulse (!) 58  Ht '5\' 8"'$  (1.727 m)   Wt 186 lb 6 oz (84.5 kg)   SpO2 96%   BMI 28.34 kg/m  , BMI Body mass index is 28.34 kg/m. GEN: Well nourished, well developed, in no acute distress  HEENT: normal  Neck: No JVD, carotid bruits, or masses Cardiac: Irregularly irregular ; no murmurs, rubs, or gallops, trace bilateral leg edema edema  Respiratory:  clear to auscultation bilaterally, normal work of breathing GI: soft, nontender, nondistended, + BS MS: no deformity or atrophy  Skin: warm and dry, no rash Neuro:  Strength and sensation are intact Psych: euthymic mood, full affect   EKG:  EKG is  ordered today. EKG showed atrial fibrillation with ventricular rate of 58 bpm.   ST changes likely due to digoxin.  Recent Labs: 04/03/2021: ALT 28; BUN 16; Creatinine, Ser 0.89; Hemoglobin 16.0; Platelets 175; Potassium 5.2; Sodium 140    Lipid Panel    Component Value Date/Time   CHOL 189 04/03/2021 1149   TRIG 249 (H) 04/03/2021 1149   HDL 41 04/03/2021 1149   CHOLHDL 3.7 07/17/2020 1010   VLDL 40 07/17/2020 1010   LDLCALC 105 (H) 04/03/2021 1149      Wt Readings from Last 3 Encounters:  06/26/21 186 lb 6 oz (84.5 kg)  04/03/21 188 lb 6.4 oz (85.5 kg)  12/19/20 189 lb (85.7 kg)       ASSESSMENT AND PLAN:  1. Chronic atrial fibrillation: Ventricular rate is well controlled with digoxin and metoprolol.  Her heart rate has been gradually decreasing over the years.  I elected to discontinue digoxin today to minimize medications.  Continue small dose Toprol and  if heart rate goes up, we could consider increasing the dose.   Continue long-term anticoagulation with Eliquis.  I reviewed her most recent labs done in April which showed no evidence of anemia.  Renal function remains normal with a creatinine of 0.89.  2.  Chronic diastolic heart failure: She appears to be euvolemic on small dose furosemide.   Disposition:   FU with me in 6 months  Signed,  Kathlyn Sacramento, MD  06/26/2021 10:39 AM    [place]

## 2021-07-13 ENCOUNTER — Ambulatory Visit: Payer: Self-pay | Admitting: Family Medicine

## 2021-08-07 ENCOUNTER — Other Ambulatory Visit: Payer: Self-pay | Admitting: Family Medicine

## 2021-08-07 DIAGNOSIS — N951 Menopausal and female climacteric states: Secondary | ICD-10-CM

## 2021-08-07 NOTE — Telephone Encounter (Signed)
Courtesy refill. Future visit in 1 month. No documented mammogram .

## 2021-08-17 ENCOUNTER — Other Ambulatory Visit: Payer: Self-pay | Admitting: Cardiovascular Disease

## 2021-08-19 DIAGNOSIS — M542 Cervicalgia: Secondary | ICD-10-CM | POA: Diagnosis not present

## 2021-09-10 ENCOUNTER — Ambulatory Visit: Payer: Self-pay | Admitting: Family Medicine

## 2021-09-11 ENCOUNTER — Other Ambulatory Visit: Payer: Self-pay | Admitting: Cardiovascular Disease

## 2021-09-11 DIAGNOSIS — H401133 Primary open-angle glaucoma, bilateral, severe stage: Secondary | ICD-10-CM | POA: Diagnosis not present

## 2021-09-11 NOTE — Telephone Encounter (Signed)
Eliquis 5 mg refill request received. Patient is 80 years old, weight-84.5 kg, Crea- 0.89 on 04/03/21, Diagnosis-afib, and last seen by Dr. Fletcher Anon on 06/26/21. Dose is appropriate based on dosing criteria. Will send in refill to requested pharmacy.

## 2021-09-15 DIAGNOSIS — M542 Cervicalgia: Secondary | ICD-10-CM | POA: Diagnosis not present

## 2021-09-15 DIAGNOSIS — R293 Abnormal posture: Secondary | ICD-10-CM | POA: Diagnosis not present

## 2021-09-29 DIAGNOSIS — M542 Cervicalgia: Secondary | ICD-10-CM | POA: Diagnosis not present

## 2021-09-30 ENCOUNTER — Other Ambulatory Visit: Payer: Self-pay

## 2021-09-30 ENCOUNTER — Ambulatory Visit (INDEPENDENT_AMBULATORY_CARE_PROVIDER_SITE_OTHER): Payer: PPO

## 2021-09-30 DIAGNOSIS — Z23 Encounter for immunization: Secondary | ICD-10-CM | POA: Diagnosis not present

## 2021-10-01 ENCOUNTER — Other Ambulatory Visit: Payer: Self-pay | Admitting: Cardiovascular Disease

## 2021-10-01 DIAGNOSIS — I482 Chronic atrial fibrillation, unspecified: Secondary | ICD-10-CM

## 2021-10-06 DIAGNOSIS — L918 Other hypertrophic disorders of the skin: Secondary | ICD-10-CM | POA: Diagnosis not present

## 2021-10-06 DIAGNOSIS — Z86018 Personal history of other benign neoplasm: Secondary | ICD-10-CM | POA: Diagnosis not present

## 2021-10-06 DIAGNOSIS — L570 Actinic keratosis: Secondary | ICD-10-CM | POA: Diagnosis not present

## 2021-10-06 DIAGNOSIS — Z872 Personal history of diseases of the skin and subcutaneous tissue: Secondary | ICD-10-CM | POA: Diagnosis not present

## 2021-10-06 DIAGNOSIS — L578 Other skin changes due to chronic exposure to nonionizing radiation: Secondary | ICD-10-CM | POA: Diagnosis not present

## 2021-10-06 DIAGNOSIS — L821 Other seborrheic keratosis: Secondary | ICD-10-CM | POA: Diagnosis not present

## 2021-10-06 DIAGNOSIS — L718 Other rosacea: Secondary | ICD-10-CM | POA: Diagnosis not present

## 2021-10-27 ENCOUNTER — Other Ambulatory Visit: Payer: Self-pay | Admitting: Family Medicine

## 2021-10-27 DIAGNOSIS — Z1231 Encounter for screening mammogram for malignant neoplasm of breast: Secondary | ICD-10-CM

## 2021-11-04 ENCOUNTER — Other Ambulatory Visit: Payer: Self-pay | Admitting: Family Medicine

## 2021-11-04 DIAGNOSIS — N951 Menopausal and female climacteric states: Secondary | ICD-10-CM

## 2021-11-15 ENCOUNTER — Other Ambulatory Visit: Payer: Self-pay | Admitting: Cardiovascular Disease

## 2021-11-15 ENCOUNTER — Other Ambulatory Visit: Payer: Self-pay | Admitting: Physician Assistant

## 2021-11-15 DIAGNOSIS — G629 Polyneuropathy, unspecified: Secondary | ICD-10-CM

## 2021-11-15 DIAGNOSIS — G2581 Restless legs syndrome: Secondary | ICD-10-CM

## 2021-11-16 NOTE — Telephone Encounter (Signed)
Attempted to schedule.  

## 2021-11-16 NOTE — Telephone Encounter (Signed)
Please schedule F/U appointment for further refills. Thank you! 

## 2021-11-18 ENCOUNTER — Ambulatory Visit
Admission: RE | Admit: 2021-11-18 | Discharge: 2021-11-18 | Disposition: A | Discharge status: Home / Self Care | Payer: PPO | Source: Ambulatory Visit | Attending: Family Medicine | Admitting: Family Medicine

## 2021-11-18 ENCOUNTER — Other Ambulatory Visit: Payer: Self-pay

## 2021-11-18 DIAGNOSIS — Z1231 Encounter for screening mammogram for malignant neoplasm of breast: Secondary | ICD-10-CM

## 2021-11-19 ENCOUNTER — Other Ambulatory Visit: Payer: Self-pay | Admitting: Family Medicine

## 2021-11-19 DIAGNOSIS — N6489 Other specified disorders of breast: Secondary | ICD-10-CM

## 2021-11-19 DIAGNOSIS — R928 Other abnormal and inconclusive findings on diagnostic imaging of breast: Secondary | ICD-10-CM

## 2021-12-02 ENCOUNTER — Inpatient Hospital Stay: Admission: RE | Admit: 2021-12-02 | Payer: PPO | Source: Ambulatory Visit

## 2021-12-02 ENCOUNTER — Ambulatory Visit: Admission: RE | Admit: 2021-12-02 | Payer: PPO | Source: Ambulatory Visit

## 2021-12-03 ENCOUNTER — Telehealth: Payer: Self-pay | Admitting: Urgent Care

## 2021-12-03 ENCOUNTER — Ambulatory Visit
Admission: RE | Admit: 2021-12-03 | Discharge: 2021-12-03 | Disposition: A | Discharge status: Home / Self Care | Payer: PPO | Attending: Urgent Care | Admitting: Urgent Care

## 2021-12-03 ENCOUNTER — Ambulatory Visit
Admission: EM | Admit: 2021-12-03 | Discharge: 2021-12-03 | Disposition: A | Discharge status: Home / Self Care | Payer: PPO | Attending: Urgent Care | Admitting: Urgent Care

## 2021-12-03 ENCOUNTER — Other Ambulatory Visit: Payer: Self-pay

## 2021-12-03 ENCOUNTER — Ambulatory Visit
Admission: RE | Admit: 2021-12-03 | Discharge: 2021-12-03 | Disposition: A | Discharge status: Home / Self Care | Payer: PPO | Source: Ambulatory Visit | Attending: Urgent Care | Admitting: Urgent Care

## 2021-12-03 DIAGNOSIS — I4821 Permanent atrial fibrillation: Secondary | ICD-10-CM

## 2021-12-03 DIAGNOSIS — R0902 Hypoxemia: Secondary | ICD-10-CM | POA: Insufficient documentation

## 2021-12-03 DIAGNOSIS — R051 Acute cough: Secondary | ICD-10-CM

## 2021-12-03 DIAGNOSIS — I5030 Unspecified diastolic (congestive) heart failure: Secondary | ICD-10-CM | POA: Diagnosis not present

## 2021-12-03 DIAGNOSIS — R062 Wheezing: Secondary | ICD-10-CM | POA: Diagnosis not present

## 2021-12-03 DIAGNOSIS — R059 Cough, unspecified: Secondary | ICD-10-CM | POA: Diagnosis not present

## 2021-12-03 DIAGNOSIS — I517 Cardiomegaly: Secondary | ICD-10-CM | POA: Insufficient documentation

## 2021-12-03 MED ORDER — PREDNISONE 50 MG PO TABS: 50.0000 mg | ORAL_TABLET | Freq: Every day | ORAL | 0 refills | Status: DC

## 2021-12-03 MED ORDER — PROMETHAZINE-DM 6.25-15 MG/5ML PO SYRP: 5.0000 mL | ORAL_SOLUTION | Freq: Every evening | ORAL | 0 refills | Status: DC | PRN

## 2021-12-03 MED ORDER — BENZONATATE 100 MG PO CAPS: 100.0000 mg | ORAL_CAPSULE | Freq: Three times a day (TID) | ORAL | 0 refills | Status: DC | PRN

## 2021-12-03 NOTE — ED Triage Notes (Signed)
Pt present coughing with nasal congestion and drainage. Pt states she is wheezing  and symptom started three days ago. Pt state she has a previous prescriptions for Cipro from a past sinus infection and took that this am and it has helped some.

## 2021-12-03 NOTE — Discharge Instructions (Signed)
I will call you to discuss your results from the chest x-ray after I get the report from the imaging center. Head [place] [address] toward [address].  Then turned left at the [address] on to [address].  Continue on to [facility].  Turn left onto [address].  Turn right onto [address].    The address is [address] in [place], ZIP Code [phone].

## 2021-12-03 NOTE — Telephone Encounter (Signed)
I tried patient twice and was not able to reach her.  Called patient and left a voice message reporting that she had central airway thickening on her x-ray.  This is more consistent with acute bronchitis and therefore I will manage this with an oral prednisone course.  For now we will hold off on antibiotics for her chronic rhinitis.  Recommend a 5-day course of prednisone and 50 mg.  We will also use cough suppression medications.   DG Chest 2 View  Result Date: 12/03/2021 CLINICAL DATA:  Cough, wheezing and hypoxia. EXAM: CHEST - 2 VIEW COMPARISON:  Radiographs 03/09/2018 and 10/01/2014. FINDINGS: The heart size and mediastinal contours are stable with mild cardiomegaly and aortic atherosclerosis. There is stable chronic central airway thickening and mild subpleural reticulation in both lungs. No definite edema, confluent airspace opacity, pleural effusion or pneumothorax. Degenerative changes are again noted throughout the thoracic spine without evidence of acute osseous abnormality. IMPRESSION: Stable chronic cardiomegaly and central airway thickening. No acute cardiopulmonary process identified. Electronically Signed   By: Richardean Sale M.D.   On: 12/03/2021 15:59

## 2021-12-03 NOTE — ED Provider Notes (Signed)
Renae Gloss   MRN: [id number] DOB: 09-Jun-1941  Subjective:   Teresa Harris is a 80 y.o. female presenting for 3-day history of acute on chronic recurrent sinus congestion, sinus pressure, runny nose.  It is now eliciting cough, wheezing and chest pain from her coughing.  She has chronic rhinitis and recurrent sinus infections.  She has significant allergies to antibiotics and is only able to tolerate ciprofloxacin, Augmentin.  No history of asthma or respiratory disorders.  She does have atrial fibrillation and heart failure with preserved ejection fraction as listed in her medical chart.  No smoking history.  Patient is agreeable to COVID and flu testing.  No current facility-administered medications for this encounter.  Current Outpatient Medications:    Calcium Carbonate-Vitamin D (CALCIUM-VITAMIN D3 PO), Take 1 tablet by mouth daily. Calcium 600 = D3 1000, Disp: , Rfl:    celecoxib (CELEBREX) 200 MG capsule, TAKE 1 CAPSULE BY MOUTH TWICE DAILY, Disp: 180 capsule, Rfl: 2   Cholecalciferol (VITAMIN D3) 50 MCG (2000 UT) capsule, Take by mouth in the morning and at bedtime., Disp: , Rfl:    cycloSPORINE (RESTASIS) 0.05 % ophthalmic emulsion, Place 1 drop into both eyes 2 (two) times daily. , Disp: , Rfl:    dorzolamide-timolol (COSOPT) 22.3-6.8 MG/ML ophthalmic solution, Place 1 drop into both eyes 2 (two) times daily. , Disp: , Rfl:    ELIQUIS 5 MG TABS tablet, TAKE 1 TABLET BY MOUTH TWICE DAILY, Disp: 180 tablet, Rfl: 1   fenofibrate (TRICOR) 145 MG tablet, TAKE 1 TABLET BY MOUTH ONCE DAILY  *NEEDAPPOINTMENT FOR FURTHER FILLS*, Disp: 30 tablet, Rfl: 0   furosemide (LASIX) 20 MG tablet, TAKE 1 TABLET BY MOUTH ONCE DAILY, Disp: 90 tablet, Rfl: 1   latanoprost (XALATAN) 0.005 % ophthalmic solution, Place 1 drop into both eyes daily. , Disp: , Rfl:    LYRICA 100 MG capsule, TAKE 1 CAPSULE BY MOUTH ONCE DAILY, Disp: 90 capsule, Rfl: 1   Magnesium 400 MG CAPS, Take 400 mg by  mouth daily. , Disp: , Rfl:    metoprolol succinate (TOPROL-XL) 25 MG 24 hr tablet, Take 0.5 tablets (12.5 mg total) by mouth daily., Disp: 45 tablet, Rfl: 1   METRONIDAZOLE, TOPICAL, 0.75 % LOTN, Apply topically daily. Apply to face in AM, Disp: , Rfl:    Multiple Vitamin (MULTIVITAMIN) tablet, Take 1 tablet by mouth daily., Disp: , Rfl:    NON FORMULARY, Place 12 sprays into both nostrils 6 (six) times daily. 250GU irrigant, Disp: , Rfl:    Omega-3 Fatty Acids (FISH OIL) 1200 MG CAPS, Take by mouth 2 (two) times daily., Disp: , Rfl:    omeprazole (PRILOSEC) 40 MG capsule, Take 40 mg by mouth every morning. , Disp: , Rfl:    PREMARIN 0.625 MG tablet, TAKE 1 TABLET BY MOUTH ONCE DAILY, Disp: 90 tablet, Rfl: 0   SOOLANTRA 1 % CREA, Apply 1 application topically daily. Sometimes uses twice daily as needed for flare ups., Disp: , Rfl:    Allergies  Allergen Reactions   [name] [Moxifloxacin Hcl In Nacl] Other (See Comments)    Headache   Bimatoprost Other (See Comments)    Made my eyes red, and hurt   Brimonidine Other (See Comments)    Made my eyes red, and hurt Burned eyes.   Eszopiclone    Mobic [Meloxicam] Other (See Comments)    headache   Propofol Other (See Comments)    Had difficulty waking up Difficult to wake up.  Travoprost Other (See Comments)    Unknown   Vicodin [Hydrocodone-Acetaminophen] Nausea And Vomiting and Other (See Comments)    Sick on stomach   Cephalexin Nausea And Vomiting and Other (See Comments)    headache    Past Medical History:  Diagnosis Date   (HFpEF) heart failure with preserved ejection fraction ([place])    a. 12/2018 Echo: EF 60-65%, no rwma, mild MR. Mildly dil LA. Nl RV fxn. PASP 37mmHg.   Adverse effect of anesthesia 11/18/2015   Arthritis    Coccyalgia 5/95/6387   Complication of anesthesia    trouble waking up with Propofol   Depression    h/o   Family history of adverse reaction to anesthesia    daughter PONV   Glaucoma    left eye    Headache    H/O MIGRAINES   Hemorrhoids    Hiatal hernia    History of kidney stones    History of migraine headaches    History of stress test    a. 10/2014 MV: No ischemia.   Hx MRSA infection    Hypercholesteremia    Motion sickness    boats   Neuropathy    feet, fingers   Osteopenia    Permanent atrial fibrillation ([place]) 10/01/2014   a. CHA2DS2VASc = 3-->eliquis.   PONV (postoperative nausea and vomiting)    Wears dentures    full upper, partial lower   Wears hearing aid in both ears      Past Surgical History:  Procedure Laterality Date   ABDOMINAL HYSTERECTOMY     AUGMENTATION MAMMAPLASTY Bilateral 2004   BROW LIFT Bilateral 12/19/2020   Procedure: BLEPHAROPLASTY UPPER EYELID; W/EXCESS SKIN BLEPHAROPTOSIS REPAIR; RESECT EX BILATERAL;  Surgeon: Karle Starch, MD;  Location: [place];  Service: Ophthalmology;  Laterality: Bilateral;   CARPAL TUNNEL RELEASE Right 02/16/2016   Procedure: CARPAL TUNNEL RELEASE;  Surgeon: Dereck Leep, MD;  Location: [facility];  Service: Orthopedics;  Laterality: Right;   CARPAL TUNNEL RELEASE Left 04/12/2016   Procedure: CARPAL TUNNEL RELEASE;  Surgeon: Dereck Leep, MD;  Location: [facility];  Service: Orthopedics;  Laterality: Left;   CARPAL TUNNEL RELEASE Right 03/13/2018   Procedure: CARPAL TUNNEL RELEASE;  Surgeon: Deetta Perla, MD;  Location: [facility];  Service: Neurosurgery;  Laterality: Right;   CATARACT EXTRACTION, BILATERAL     COLONOSCOPY  12/29/09   Dr Dionne Milo   EYE SURGERY Left    shunt placed   EYE SURGERY Left    tube placed   FOOT SURGERY     bilateral    JOINT REPLACEMENT     NASAL SEPTUM SURGERY     REPLACEMENT TOTAL KNEE     bilateral   SHOULDER SURGERY     right shoulder    Family History  Problem Relation Age of Onset   Arthritis Mother    Arthritis Father    Breast cancer Neg Hx     Social History   Tobacco Use   Smoking status: Former    Packs/day: 1.00    Years: 30.00    Pack years: 30.00     Types: Cigarettes    Quit date: 02/09/1996    Years since quitting: 25.8   Smokeless tobacco: Never  Vaping Use   Vaping Use: Never used  Substance Use Topics   Alcohol use: Yes    Comment: maybe twice a year- 1 mixed drink   Drug use: No    ROS   Objective:  Vitals: BP 128/75 (BP Location: Left Arm)    Pulse 77    Temp 98.7 F (37.1 C) (Oral)    Resp 16    SpO2 90%   Pulse oximetry ranged from 90% to 93% during her office visit.  Physical Exam Constitutional:      General: She is not in acute distress.    Appearance: Normal appearance. She is well-developed. She is not ill-appearing, toxic-appearing or diaphoretic.  HENT:     Head: Normocephalic and atraumatic.     Right Ear: External ear normal.     Left Ear: External ear normal.     Nose: Congestion and rhinorrhea present.     Mouth/Throat:     Mouth: Mucous membranes are moist.  Eyes:     Extraocular Movements: Extraocular movements intact.     Pupils: Pupils are equal, round, and reactive to light.  Cardiovascular:     Rate and Rhythm: Normal rate and regular rhythm.     Pulses: Normal pulses.     Heart sounds: Normal heart sounds. No murmur heard.   No friction rub. No gallop.  Pulmonary:     Effort: Pulmonary effort is normal. No respiratory distress.     Breath sounds: No stridor. Wheezing and rhonchi present. No rales.  Skin:    General: Skin is warm and dry.     Findings: No rash.  Neurological:     Mental Status: She is alert and oriented to person, place, and time.  Psychiatric:        Mood and Affect: Mood normal.        Behavior: Behavior normal.        Thought Content: Thought content normal.   Assessment and Plan :   PDMP not reviewed this encounter.  1. Acute cough   2. Wheezing   3. Permanent atrial fibrillation ([place])   4. Heart failure with preserved ejection fraction, unspecified HF chronicity ([place])     I am pursuing a chest x-ray as an outpatient.  We will follow-up with the  treatment plan once I have the results from the overread.    Jaynee Eagles, PA-C 12/03/21 0802

## 2021-12-04 ENCOUNTER — Encounter: Payer: Self-pay | Admitting: Physician Assistant

## 2021-12-04 ENCOUNTER — Ambulatory Visit (INDEPENDENT_AMBULATORY_CARE_PROVIDER_SITE_OTHER): Payer: PPO | Admitting: Physician Assistant

## 2021-12-04 ENCOUNTER — Other Ambulatory Visit: Payer: Self-pay

## 2021-12-04 VITALS — BP 130/81 | HR 64 | Temp 98.6°F | Resp 18 | Ht 68.0 in | Wt 192.0 lb

## 2021-12-04 DIAGNOSIS — J209 Acute bronchitis, unspecified: Secondary | ICD-10-CM

## 2021-12-04 DIAGNOSIS — J069 Acute upper respiratory infection, unspecified: Secondary | ICD-10-CM | POA: Diagnosis not present

## 2021-12-04 NOTE — Progress Notes (Signed)
Established patient visit   Patient: Teresa Harris   DOB: 10/23/1941   80 y.o. Female  MRN: [id number] Visit Date: 12/04/2021  Today's healthcare provider: Almon Register, PA-C   Chief Complaint  Patient presents with   Sinusitis   Subjective    Sinusitis This is a new problem. The current episode started in the past 7 days (5 days). The problem is unchanged. There has been no fever. Associated symptoms include congestion, coughing, neck pain, shortness of breath, sinus pressure and sneezing. Pertinent negatives include no chills, diaphoresis, ear pain, headaches, hoarse voice, sore throat or swollen glands.    Saw UC yesterday. Given medication and chest x-ray was done.  She is having cough, congestion, sinus pressure,  States her symptoms are improving since the start of illness She states she had a half bottle of Cipro at home and started that on Monday which helped with her symptoms She admits to intermittent productive cough She states she only slept about 2 hours last night and feels "terrible"   She is concerned for medication regimen given at [facility]  She was given Promethazine cough syrup, Tessalon pearls and Prednisone 50mg  PO QD x 5days   States she has chronic sinus infections and is compliant with her regimen to prevent exacerbations She states she has previously had an allergic reaction while taking Prednisone but has taken it since that reaction without issue.  States she is afraid she will develop pneumonia without steroid - states she has been on a taper previously without issue.    Medications: Outpatient Medications Prior to Visit  Medication Sig   benzonatate (TESSALON) 100 MG capsule Take 1-2 capsules (100-200 mg total) by mouth 3 (three) times daily as needed for cough.   Calcium Carbonate-Vitamin D (CALCIUM-VITAMIN D3 PO) Take 1 tablet by mouth daily. Calcium 600 = D3 1000   celecoxib (CELEBREX) 200 MG capsule TAKE 1 CAPSULE BY MOUTH TWICE DAILY    Cholecalciferol (VITAMIN D3) 50 MCG (2000 UT) capsule Take by mouth in the morning and at bedtime.   cycloSPORINE (RESTASIS) 0.05 % ophthalmic emulsion Place 1 drop into both eyes 2 (two) times daily.    dorzolamide-timolol (COSOPT) 22.3-6.8 MG/ML ophthalmic solution Place 1 drop into both eyes 2 (two) times daily.    ELIQUIS 5 MG TABS tablet TAKE 1 TABLET BY MOUTH TWICE DAILY   fenofibrate (TRICOR) 145 MG tablet TAKE 1 TABLET BY MOUTH ONCE DAILY  *NEEDAPPOINTMENT FOR FURTHER FILLS*   furosemide (LASIX) 20 MG tablet TAKE 1 TABLET BY MOUTH ONCE DAILY   latanoprost (XALATAN) 0.005 % ophthalmic solution Place 1 drop into both eyes daily.    LYRICA 100 MG capsule TAKE 1 CAPSULE BY MOUTH ONCE DAILY   Magnesium 400 MG CAPS Take 400 mg by mouth daily.    metoprolol succinate (TOPROL-XL) 25 MG 24 hr tablet Take 0.5 tablets (12.5 mg total) by mouth daily.   METRONIDAZOLE, TOPICAL, 0.75 % LOTN Apply topically daily. Apply to face in AM   Multiple Vitamin (MULTIVITAMIN) tablet Take 1 tablet by mouth daily.   NON FORMULARY Place 12 sprays into both nostrils 6 (six) times daily. 250GU irrigant   Omega-3 Fatty Acids (FISH OIL) 1200 MG CAPS Take by mouth 2 (two) times daily.   omeprazole (PRILOSEC) 40 MG capsule Take 40 mg by mouth every morning.    predniSONE (DELTASONE) 50 MG tablet Take 1 tablet (50 mg total) by mouth daily with breakfast.   PREMARIN  0.625 MG tablet TAKE 1 TABLET BY MOUTH ONCE DAILY   promethazine-dextromethorphan (PROMETHAZINE-DM) 6.25-15 MG/5ML syrup Take 5 mLs by mouth at bedtime as needed for cough.   SOOLANTRA 1 % CREA Apply 1 application topically daily. Sometimes uses twice daily as needed for flare ups.   No facility-administered medications prior to visit.    Review of Systems  Constitutional:  Negative for appetite change, chills, diaphoresis, fatigue and fever.  HENT:  Positive for congestion, postnasal drip, rhinorrhea, sinus pressure and sneezing. Negative for ear pain,  hoarse voice, sinus pain and sore throat.   Respiratory:  Positive for cough, shortness of breath and wheezing. Negative for chest tightness.   Cardiovascular:  Negative for chest pain and palpitations.  Gastrointestinal:  Negative for abdominal pain, nausea and vomiting.  Musculoskeletal:  Positive for neck pain.  Neurological:  Negative for dizziness, weakness and headaches.      Objective    BP 130/81 (BP Location: Right Arm, Patient Position: Sitting, Cuff Size: Large)    Pulse 64    Temp 98.6 F (37 C) (Temporal)    Resp 18    Ht 5\' 8"  (1.727 m)    Wt 192 lb (87.1 kg)    SpO2 98%    BMI 29.19 kg/m  {Show previous vital signs (optional):23777}  Physical Exam Constitutional:      Appearance: Normal appearance.  HENT:     Head: Normocephalic and atraumatic.     Mouth/Throat:     Mouth: Mucous membranes are moist.     Pharynx: Oropharynx is clear. Uvula midline.     Tonsils: No tonsillar exudate or tonsillar abscesses.  Cardiovascular:     Rate and Rhythm: Normal rate and regular rhythm.     Pulses: Normal pulses.     Heart sounds: Normal heart sounds.  Pulmonary:     Effort: Pulmonary effort is normal.     Breath sounds: Decreased air movement present. Examination of the right-upper field reveals rhonchi. Examination of the left-upper field reveals rhonchi. Wheezing and rhonchi present. No decreased breath sounds or rales.  Musculoskeletal:     Cervical back: Normal range of motion and neck supple.     Right lower leg: No edema.     Left lower leg: No edema.  Neurological:     Mental Status: She is alert.      No results found for any visits on 12/04/21.  Assessment & Plan      1. Acute bronchitis with wheezing Acute, likely secondary to suspected URI  Patient was seen in UC and provided with Prednisone 50mg  PO QD x 5 days burst to assist with SOB Provided reassurance to patient regarding medications and informed her of ER visit precautions Follow up in 5-7 days if  symptoms are not improving  2. Upper respiratory tract infection, unspecified type Patient was seen in UC yesterday for symptoms indicating Acute URI with cough, wheezing, congestion, sinus pressure and rhinorrhea.  Wheezing appreciated globally with rhonchi  Patient was given Prednisone burst at [facility] for breathing and SOB Advised patient that if symptoms are not improving in 5-7 days or worsen she should return to office Recommend continue with Tessalon pearls in daytime and use promethazine cough syrup at night for cough symptoms.  OTC medication for symptom relief could also be used per patient preference          Larey Seat  [facility] [phone] (phone) [phone] (fax)  [place]

## 2021-12-04 NOTE — Patient Instructions (Addendum)
Based on your described symptoms and the duration of symptoms it is likely that you have a viral upper respiratory infection (often called a "cold")  Symptoms can last for 3-10 days with lingering cough and intermittent symptoms lasting weeks after that.  The goal of treatment at this time is to reduce your symptoms and discomfort   You can use over the counter medications such as Dayquil/Nyquil, AlkaSeltzer formulations, etc to provide further relief of symptoms according to the manufacturer's instructions  If preferred you can use Coricidin to manage your symptoms rather than those medications mentioned above.   You were also given medications at urgent care. I recommend you take those to help with your breathing and coughing.  If your breathing does not improve after taking those medications for several days please call us to schedule a follow up If you have a reaction to those medications please call emergency services and tell them you are having an allergic reaction.   If your symptoms do not improve or become worse in the next 5-7 days please make an apt at the office so we can see you  Go to the ER if you begin to have more serious symptoms such as shortness of breath, trouble breathing, loss of consciousness, swelling around the eyes, high fever, severe lasting headaches, vision changes or neck pain/stiffness.

## 2021-12-05 LAB — COVID-19, FLU A+B NAA
Influenza A, NAA: DETECTED — AB
Influenza B, NAA: NOT DETECTED
SARS-CoV-2, NAA: NOT DETECTED

## 2021-12-11 DIAGNOSIS — J45991 Cough variant asthma: Secondary | ICD-10-CM | POA: Diagnosis not present

## 2021-12-11 DIAGNOSIS — J329 Chronic sinusitis, unspecified: Secondary | ICD-10-CM | POA: Diagnosis not present

## 2021-12-11 DIAGNOSIS — J209 Acute bronchitis, unspecified: Secondary | ICD-10-CM | POA: Diagnosis not present

## 2021-12-12 ENCOUNTER — Other Ambulatory Visit: Payer: Self-pay | Admitting: Cardiovascular Disease

## 2021-12-13 ENCOUNTER — Other Ambulatory Visit: Payer: Self-pay | Admitting: Cardiovascular Disease

## 2021-12-14 NOTE — Telephone Encounter (Signed)
Prescription refill request for Eliquis received. Indication: Atrial Fib Last office visit: 06/26/21  Rod Can MD Scr: 0.89 on 04/03/21 Age: 81 Weight: 84.5kg  Based on above findings Eliquis 5mg  twice daily is the appropriate dose.  Pt is past due for lab work.  Message sent to nurse to schedule CBC/BMP for further Eliquis refills to assure correct dosage.  Refill approved x 1

## 2021-12-14 NOTE — Telephone Encounter (Signed)
Reviewed the patient's chart. She is due for an office visit on 01/12/22 with Ignacia Bayley, NP.  I called the patient to advise her she will need to have some lab work repeated to insure she is on the correct dose of medication.  Since her RX was sent today, I advised the patient we could arrange for her to come in prior to 2/7 appt with Gerald Stabs so she did not run out of Eliquis. Per the patient, she thought she would be ok until her 2/7 appt and would like to have her lab work done then.  I advised the patient that will be fine, but asked her to please call prior to that to arrange lab work if she is running short on her Eliquis. The patient voices understanding and is agreeable.

## 2021-12-15 ENCOUNTER — Inpatient Hospital Stay: Admission: RE | Admit: 2021-12-15 | Payer: PPO | Source: Ambulatory Visit

## 2021-12-17 ENCOUNTER — Telehealth: Payer: Self-pay | Admitting: Cardiovascular Disease

## 2021-12-17 MED ORDER — FENOFIBRATE 145 MG PO TABS: ORAL_TABLET | ORAL | 0 refills | Status: DC

## 2021-12-17 NOTE — Telephone Encounter (Signed)
°*  STAT* If patient is at the pharmacy, call can be transferred to refill team.   1. Which medications need to be refilled? (please list name of each medication and dose if known)  fenofibrate   2. Which pharmacy/location (including street and city if local pharmacy) is medication to be sent to?[place]   3. Do they need a 30 day or 90 day supply? [place]

## 2021-12-17 NOTE — Telephone Encounter (Signed)
Requested Prescriptions   Signed Prescriptions Disp Refills   fenofibrate (TRICOR) 145 MG tablet 30 tablet 0    Sig: TAKE 1 TABLET BY MOUTH ONCE DAILY    Authorizing Provider: Kathlyn Sacramento A    Ordering User: Britt Bottom

## 2021-12-23 ENCOUNTER — Ambulatory Visit: Payer: PPO | Admitting: Podiatry

## 2021-12-23 ENCOUNTER — Other Ambulatory Visit: Payer: Self-pay

## 2021-12-23 ENCOUNTER — Ambulatory Visit
Admission: RE | Admit: 2021-12-23 | Discharge: 2021-12-23 | Disposition: A | Discharge status: Home / Self Care | Payer: PPO | Source: Ambulatory Visit | Attending: Family Medicine | Admitting: Family Medicine

## 2021-12-23 ENCOUNTER — Encounter: Payer: Self-pay | Admitting: Podiatry

## 2021-12-23 DIAGNOSIS — L97521 Non-pressure chronic ulcer of other part of left foot limited to breakdown of skin: Secondary | ICD-10-CM | POA: Diagnosis not present

## 2021-12-23 DIAGNOSIS — R928 Other abnormal and inconclusive findings on diagnostic imaging of breast: Secondary | ICD-10-CM | POA: Insufficient documentation

## 2021-12-23 DIAGNOSIS — R922 Inconclusive mammogram: Secondary | ICD-10-CM | POA: Diagnosis not present

## 2021-12-23 DIAGNOSIS — N6489 Other specified disorders of breast: Secondary | ICD-10-CM

## 2021-12-23 MED ORDER — DOXYCYCLINE HYCLATE 100 MG PO TABS: 100.0000 mg | ORAL_TABLET | Freq: Two times a day (BID) | ORAL | 1 refills | Status: DC

## 2021-12-23 MED ORDER — MUPIROCIN 2 % EX OINT: 1.0000 "application " | TOPICAL_OINTMENT | Freq: Two times a day (BID) | CUTANEOUS | 0 refills | Status: DC

## 2021-12-23 NOTE — Progress Notes (Signed)
She presents today chief concern of a painful hallux left.  She states that the callus at the tip of the toe that I saw before has now ulcerated and become painful sore.  She states that her Subedi can hardly touch it she states that she is currently taking Levaquin for sinus infection.  She denies fever chills nausea run muscle aches pains calf pain back pain chest pain shortness of breath.  Objective: Strong palpable pulses bilateral capillary fill time is immediate vital signs are stable she is alert and oriented x3 there is mild edema about the forefoot with mild erythema.  She has a granulation tissue and no signs of epithelization to the distal aspect of the hallux left.  The wound measures about a centimeter in diameter but does not probe deep.  No purulence no malodor.  Erythema does extend to the level of the interphalangeal joint.  Assessment: Extended hallux at the IP joint with tight shoe gear resulting and ulceration of this toe.  Plan: Discussed etiology pathology and surgical therapies discussed getting rid of the shoes that she is currently wearing because they are too tight.  She understands that but I am not sure that she will do this.  I I did place silver nitrate on the toe today placed Silvadene cream and a light dressing.  Placed her in a Darco shoe I will start her on doxycycline and Bactroban ointment dressing changes and continue use of the Darco I will follow-up with her in 1 week

## 2021-12-28 ENCOUNTER — Other Ambulatory Visit: Payer: Self-pay | Admitting: Cardiovascular Disease

## 2021-12-28 DIAGNOSIS — I482 Chronic atrial fibrillation, unspecified: Secondary | ICD-10-CM

## 2021-12-30 ENCOUNTER — Ambulatory Visit: Payer: PPO | Admitting: Podiatry

## 2021-12-30 ENCOUNTER — Encounter: Payer: Self-pay | Admitting: Podiatry

## 2021-12-30 ENCOUNTER — Ambulatory Visit (INDEPENDENT_AMBULATORY_CARE_PROVIDER_SITE_OTHER): Payer: PPO

## 2021-12-30 ENCOUNTER — Other Ambulatory Visit: Payer: Self-pay

## 2021-12-30 DIAGNOSIS — L97521 Non-pressure chronic ulcer of other part of left foot limited to breakdown of skin: Secondary | ICD-10-CM

## 2021-12-30 DIAGNOSIS — M869 Osteomyelitis, unspecified: Secondary | ICD-10-CM

## 2021-12-30 DIAGNOSIS — J209 Acute bronchitis, unspecified: Secondary | ICD-10-CM | POA: Diagnosis not present

## 2021-12-30 DIAGNOSIS — H6123 Impacted cerumen, bilateral: Secondary | ICD-10-CM | POA: Diagnosis not present

## 2021-12-30 MED ORDER — LEVOFLOXACIN 750 MG PO TABS: 750.0000 mg | ORAL_TABLET | Freq: Every day | ORAL | 1 refills | Status: DC

## 2021-12-30 MED ORDER — FLUCONAZOLE 150 MG PO TABS: 150.0000 mg | ORAL_TABLET | Freq: Once | ORAL | 0 refills | Status: AC

## 2021-12-30 MED ORDER — CLINDAMYCIN HCL 150 MG PO CAPS: 150.0000 mg | ORAL_CAPSULE | Freq: Three times a day (TID) | ORAL | 1 refills | Status: DC

## 2021-12-31 NOTE — Progress Notes (Signed)
She presents today for follow-up of ulceration to the distal aspect of her hallux left.  She says it may be a little bit better but for the most part looks the same.  She states that has been somewhat swollen and my foot is been swollen as well.  Objective: Vital signs are stable she alert and oriented x3 there is mild erythema around the distal aspect of the toe no erythema around the dorsum of the foot or the leg though there is some swelling there.  It is nonpitting appears to be fluctuant she has no pain in the calf on medial lateral compression.  The distal aspect of the toe does demonstrate a superficial ulceration with some granulation tissue it is smaller than previously noted currently it is 0.8 cm in diameter and there is no depth to it.  There is epithelialization occurring.  No signs of outward infection.  However the toe itself is red to the level of the interphalangeal joint.  Radiographs taken today do demonstrate at the very tuft of the toe with what appears to be lysis of the cortex consistent with osteomyelitis.  Assessment: Possible osteomyelitis hallux left.  Plan: Discussed etiology pathology and surgical therapies debrided the ulcer today redressed it with Silvadene cream dry sterile compressive dressing and put her back in her Darco shoe.  We are requesting MRI to help rule out osteomyelitis.  We will change her medications from doxycycline to clindamycin and Levaquin she will take this as prescribed she will call with questions or concerns.  Currently do not think we need to consider a Doppler to her left leg but we may need to consider if the swelling does not go down as infection improves.  Also may need to consider toe pressures even though she has good palpable pulses.  I will follow-up with her in 1 week.  We did discuss the possible need for resection of a portion of this toe.

## 2022-01-01 ENCOUNTER — Other Ambulatory Visit: Payer: Self-pay | Admitting: Podiatry

## 2022-01-05 ENCOUNTER — Other Ambulatory Visit: Payer: Self-pay

## 2022-01-05 ENCOUNTER — Encounter: Payer: Self-pay | Admitting: Podiatry

## 2022-01-05 ENCOUNTER — Ambulatory Visit: Payer: PPO | Admitting: Podiatry

## 2022-01-05 DIAGNOSIS — L97522 Non-pressure chronic ulcer of other part of left foot with fat layer exposed: Secondary | ICD-10-CM | POA: Diagnosis not present

## 2022-01-05 NOTE — Progress Notes (Signed)
Subjective:  81 y.o. female presenting today for follow-up evaluation of an ulcer to the distal tip of the left great toe with cellulitis.  Patient has been on clindamycin and Levaquin oral antibiotics as well as applying mupirocin ointment 2 times daily to the ulcer.  Overall there is improvement.  She is weightbearing in the postsurgical shoe as instructed.  No new complaints at this time  Past Medical History:  Diagnosis Date   (HFpEF) heart failure with preserved ejection fraction ([place])    a. 12/2018 Echo: EF 60-65%, no rwma, mild MR. Mildly dil LA. Nl RV fxn. PASP 28mmHg.   Adverse effect of anesthesia 11/18/2015   Arthritis    Coccyalgia 8/46/9629   Complication of anesthesia    trouble waking up with Propofol   Depression    h/o   Family history of adverse reaction to anesthesia    daughter PONV   Glaucoma    left eye   Headache    H/O MIGRAINES   Hemorrhoids    Hiatal hernia    History of kidney stones    History of migraine headaches    History of stress test    a. 10/2014 MV: No ischemia.   Hx MRSA infection    Hypercholesteremia    Motion sickness    boats   Neuropathy    feet, fingers   Osteopenia    Permanent atrial fibrillation ([place]) 10/01/2014   a. CHA2DS2VASc = 3-->eliquis.   PONV (postoperative nausea and vomiting)    Wears dentures    full upper, partial lower   Wears hearing aid in both ears      Objective/Physical Exam General: The patient is alert and oriented x3 in no acute distress.  Dermatology:  Wound #1 noted to the distal tip of the left great toe measuring approximately 0.3 x 0.7 x 0.1 cm (LxWxD).   To the noted ulceration(s), there is no eschar.  Minimal slough and fibrin noted. Granulation tissue and wound base is red. There is a minimal amount of serosanguineous drainage noted. There is no exposed bone muscle-tendon ligament or joint.  Overall the wound is very superficial.  There is no malodor. Periwound integrity is intact. Skin is  warm, dry and supple bilateral lower extremities.  Vascular: Palpable pedal pulses bilaterally. Mild edema noted. Capillary refill within normal limits. Varicosities noted bilateral lower extremities.   Neurological: Epicritic and protective threshold diminished bilaterally.   Musculoskeletal Exam: Range of motion within normal limits to all pedal and ankle joints bilateral. Muscle strength 5/5 in all groups bilateral.   Assessment: #1  Ulcer distal tip of the left great toe  #2 varicosities bilateral lower extremities  Plan of Care:  #1 Patient was evaluated. #2 medically necessary excisional debridement including subcutaneous tissue was performed using a tissue nipper and a chisel blade. Excisional debridement of all the necrotic nonviable tissue down to healthy bleeding viable tissue was performed with post-debridement measurements same as pre-. #3 the wound was cleansed and antibiotic ointment and a Band-Aid was applied #4 continue mupirocin ointment and a light Band-Aid daily #5 continue oral antibiotics until completed #6 continue postsurgical shoe #7 return to clinic in 2 weeks    [name], DPM [facility]  Dr. [name], DPM  [place],  [address]                Office [phone]  Fax [phone]

## 2022-01-12 ENCOUNTER — Encounter: Payer: Self-pay | Admitting: Nurse Practitioner

## 2022-01-12 ENCOUNTER — Ambulatory Visit: Payer: PPO | Admitting: Nurse Practitioner

## 2022-01-12 ENCOUNTER — Encounter: Payer: Self-pay | Admitting: Cardiovascular Disease

## 2022-01-12 ENCOUNTER — Telehealth: Payer: Self-pay

## 2022-01-12 ENCOUNTER — Other Ambulatory Visit: Payer: Self-pay

## 2022-01-12 VITALS — BP 120/80 | HR 79 | Ht 68.0 in | Wt 195.0 lb

## 2022-01-12 DIAGNOSIS — E781 Pure hyperglyceridemia: Secondary | ICD-10-CM | POA: Diagnosis not present

## 2022-01-12 DIAGNOSIS — I4821 Permanent atrial fibrillation: Secondary | ICD-10-CM

## 2022-01-12 DIAGNOSIS — I5032 Chronic diastolic (congestive) heart failure: Secondary | ICD-10-CM

## 2022-01-12 DIAGNOSIS — I482 Chronic atrial fibrillation, unspecified: Secondary | ICD-10-CM | POA: Diagnosis not present

## 2022-01-12 DIAGNOSIS — E782 Mixed hyperlipidemia: Secondary | ICD-10-CM | POA: Diagnosis not present

## 2022-01-12 MED ORDER — FENOFIBRATE 145 MG PO TABS: ORAL_TABLET | ORAL | 3 refills | Status: DC

## 2022-01-12 NOTE — Telephone Encounter (Signed)
Spoke with patient and advised refill has been sent to her pharmacy. She was appreciative with no further needs at this time.

## 2022-01-12 NOTE — Progress Notes (Signed)
Office Visit    Patient Name: Teresa Harris Date of Encounter: 01/12/2022  Primary Care Provider:  Virginia Crews, MD Primary Cardiologist:  Kathlyn Sacramento, MD  Chief Complaint    81 year old female with a history of permanent atrial fibrillation, borderline diabetes, hyperlipidemia, and diastolic heart failure, who presents for follow-up of atrial fibrillation.  Past Medical History    Past Medical History:  Diagnosis Date   (HFpEF) heart failure with preserved ejection fraction ([place])    a. 12/2018 Echo: EF 60-65%, no rwma, mild MR. Mildly dil LA. Nl RV fxn. PASP 29mmHg.   Adverse effect of anesthesia 11/18/2015   Arthritis    Coccyalgia 0/93/2355   Complication of anesthesia    trouble waking up with Propofol   Depression    h/o   Family history of adverse reaction to anesthesia    daughter PONV   Glaucoma    left eye   Headache    H/O MIGRAINES   Hemorrhoids    Hiatal hernia    History of kidney stones    History of migraine headaches    History of stress test    a. 10/2014 MV: No ischemia.   Hx MRSA infection    Hypercholesteremia    Motion sickness    boats   Neuropathy    feet, fingers   Osteopenia    Permanent atrial fibrillation ([place]) 10/01/2014   a. CHA2DS2VASc = 3-->eliquis.   PONV (postoperative nausea and vomiting)    Wears dentures    full upper, partial lower   Wears hearing aid in both ears    Past Surgical History:  Procedure Laterality Date   ABDOMINAL HYSTERECTOMY     AUGMENTATION MAMMAPLASTY Bilateral 2004   BROW LIFT Bilateral 12/19/2020   Procedure: BLEPHAROPLASTY UPPER EYELID; W/EXCESS SKIN BLEPHAROPTOSIS REPAIR; RESECT EX BILATERAL;  Surgeon: Karle Starch, MD;  Location: [place];  Service: Ophthalmology;  Laterality: Bilateral;   CARPAL TUNNEL RELEASE Right 02/16/2016   Procedure: CARPAL TUNNEL RELEASE;  Surgeon: Dereck Leep, MD;  Location: [facility];  Service: Orthopedics;  Laterality: Right;   CARPAL TUNNEL  RELEASE Left 04/12/2016   Procedure: CARPAL TUNNEL RELEASE;  Surgeon: Dereck Leep, MD;  Location: [facility];  Service: Orthopedics;  Laterality: Left;   CARPAL TUNNEL RELEASE Right 03/13/2018   Procedure: CARPAL TUNNEL RELEASE;  Surgeon: Deetta Perla, MD;  Location: [facility];  Service: Neurosurgery;  Laterality: Right;   CATARACT EXTRACTION, BILATERAL     COLONOSCOPY  12/29/09   Dr Dionne Milo   EYE SURGERY Left    shunt placed   EYE SURGERY Left    tube placed   FOOT SURGERY     bilateral    JOINT REPLACEMENT     NASAL SEPTUM SURGERY     REPLACEMENT TOTAL KNEE     bilateral   SHOULDER SURGERY     right shoulder    Allergies  Allergies  Allergen Reactions   [name] [Moxifloxacin Hcl In Nacl] Other (See Comments)    Headache   Bimatoprost Other (See Comments)    Made my eyes red, and hurt   Brimonidine Other (See Comments)    Made my eyes red, and hurt Burned eyes.   Clindamycin/Lincomycin Diarrhea    Severe diarrhea   Eszopiclone    Mobic [Meloxicam] Other (See Comments)    headache   Propofol Other (See Comments)    Had difficulty waking up Difficult to wake up.   Travoprost Other (See  Comments)    Unknown   Vicodin [Hydrocodone-Acetaminophen] Nausea And Vomiting and Other (See Comments)    Sick on stomach   Cephalexin Nausea And Vomiting and Other (See Comments)    headache    History of Present Illness    81 year old female with above past medical history including permanent atrial fibrillation, borderline diabetes, hyperlipidemia, and diastolic heart failure.  She previously smoked but quit more than 30 years ago.  She underwent stress testing in November 2015, which was nonischemic.  Most recent echocardiogram in January 2020, showed normal LV function, mild MR, mildly dilated left atrium, normal RV function, and a PASP of 35 mmHg.  She was last seen in cardiology clinic in July 2022, at which time she was doing well without chest pain, dyspnea, or  palpitations.  Since her last visit, Teresa Harris has continued to do well from a cardiac standpoint.  She has had a little bit of a difficult time since Christmas as she developed respiratory infection that progressed to pneumonia, and subsequently developed an ulceration on her left great toe caused by rubbing inside her shoe, for which she has been seeing podiatry and is currently pending an MRI.  From a cardiac standpoint, she has done well.  She denies chest pain, dyspnea, palpitations, PND, orthopnea, dizziness, syncope, or early satiety.  In the setting of left foot infection, she did have swelling of her left greater than right ankles, which has since improved significantly with elevation and compression.  Home Medications    Current Outpatient Medications  Medication Sig Dispense Refill   apixaban (ELIQUIS) 5 MG TABS tablet TAKE 1 TABLET BY MOUTH TWICE DAILY 60 tablet 0   Calcium Carbonate-Vitamin D (CALCIUM-VITAMIN D3 PO) Take 1 tablet by mouth daily. Calcium 600 = D3 1000     celecoxib (CELEBREX) 200 MG capsule Take 200 mg by mouth daily.     Cholecalciferol (VITAMIN D3) 50 MCG (2000 UT) capsule Take by mouth in the morning and at bedtime.     cycloSPORINE (RESTASIS) 0.05 % ophthalmic emulsion Place 1 drop into both eyes 2 (two) times daily.      dorzolamide-timolol (COSOPT) 22.3-6.8 MG/ML ophthalmic solution Place 1 drop into both eyes 2 (two) times daily.      fenofibrate (TRICOR) 145 MG tablet TAKE 1 TABLET BY MOUTH ONCE DAILY 30 tablet 0   furosemide (LASIX) 20 MG tablet TAKE 1 TABLET BY MOUTH ONCE DAILY 90 tablet 1   latanoprost (XALATAN) 0.005 % ophthalmic solution Place 1 drop into both eyes daily.      levofloxacin (LEVAQUIN) 750 MG tablet Take 1 tablet (750 mg total) by mouth daily. 30 tablet 1   LYRICA 100 MG capsule TAKE 1 CAPSULE BY MOUTH ONCE DAILY 90 capsule 1   Magnesium 400 MG CAPS Take 400 mg by mouth daily.      metoprolol succinate (TOPROL-XL) 25 MG 24 hr tablet TAKE  1/2 TABLET BY MOUTH ONCE DAILY 45 tablet 1   METRONIDAZOLE, TOPICAL, 0.75 % LOTN Apply topically daily. Apply to face in AM     Multiple Vitamin (MULTIVITAMIN) tablet Take 1 tablet by mouth daily.     NON FORMULARY Place 12 sprays into both nostrils 6 (six) times daily. 250GU irrigant     Omega-3 Fatty Acids (FISH OIL) 1200 MG CAPS Take by mouth 2 (two) times daily.     omeprazole (PRILOSEC) 40 MG capsule Take 40 mg by mouth every morning.      PREMARIN 0.625 MG tablet  TAKE 1 TABLET BY MOUTH ONCE DAILY 90 tablet 0   SOOLANTRA 1 % CREA Apply 1 application topically daily. Sometimes uses twice daily as needed for flare ups.     benzonatate (TESSALON) 100 MG capsule Take 1-2 capsules (100-200 mg total) by mouth 3 (three) times daily as needed for cough. (Patient not taking: Reported on 01/12/2022) 60 capsule 0   clindamycin (CLEOCIN) 150 MG capsule Take 1 capsule (150 mg total) by mouth 3 (three) times daily. (Patient not taking: Reported on 01/12/2022) 30 capsule 1   mupirocin ointment (BACTROBAN) 2 % Apply 1 application topically 2 (two) times daily. (Patient not taking: Reported on 01/12/2022) 22 g 0   predniSONE (DELTASONE) 50 MG tablet Take 1 tablet (50 mg total) by mouth daily with breakfast. (Patient not taking: Reported on 01/12/2022) 5 tablet 0   promethazine-dextromethorphan (PROMETHAZINE-DM) 6.25-15 MG/5ML syrup Take 5 mLs by mouth at bedtime as needed for cough. (Patient not taking: Reported on 01/12/2022) 100 mL 0   No current facility-administered medications for this visit.     Review of Systems    Respiratory infection that started last Christmas and finally resolved after several weeks of antibiotics.  Currently dealing with left great toe ulceration/infection.  Has had bilateral left greater than right lower extremity swelling.  She denies chest pain, dyspnea, palpitations, PND, orthopnea, dizziness, syncope, or early satiety.  All other systems reviewed and are otherwise negative except as  noted above.    Physical Exam    VS:  BP 120/80 (BP Location: Left Arm, Patient Position: Sitting, Cuff Size: Normal)    Pulse 79    Ht 5\' 8"  (1.727 m)    Wt 195 lb (88.5 kg)    SpO2 97%    BMI 29.65 kg/m  , BMI Body mass index is 29.65 kg/m.     GEN: Well nourished, well developed, in no acute distress. HEENT: normal. Neck: Supple, no JVD, carotid bruits, or masses. Cardiac: Irregularly irregular, no murmurs, rubs, or gallops. No clubbing, cyanosis, trace bilateral ankle edema.  Radials/DP/PT 2+ and equal bilaterally.  Respiratory:  Respirations regular and unlabored, clear to auscultation bilaterally. GI: Soft, nontender, nondistended, BS + x 4. MS: no deformity or atrophy. Skin: warm and dry, no rash. Neuro:  Strength and sensation are intact. Psych: Normal affect.  Accessory Clinical Findings    ECG personally reviewed by me today -atrial fibrillation, 79- no acute changes.  Lab Results  Component Value Date   WBC 4.8 04/03/2021   HGB 16.0 (H) 04/03/2021   HCT 48.3 (H) 04/03/2021   MCV 97 04/03/2021   PLT 175 04/03/2021   Lab Results  Component Value Date   CREATININE 0.89 04/03/2021   BUN 16 04/03/2021   NA 140 04/03/2021   K 5.2 04/03/2021   CL 100 04/03/2021   CO2 26 04/03/2021   Lab Results  Component Value Date   ALT 28 04/03/2021   AST 26 04/03/2021   ALKPHOS 59 04/03/2021   BILITOT 0.6 04/03/2021   Lab Results  Component Value Date   CHOL 189 04/03/2021   HDL 41 04/03/2021   LDLCALC 105 (H) 04/03/2021   TRIG 249 (H) 04/03/2021   CHOLHDL 3.7 07/17/2020    Lab Results  Component Value Date   HGBA1C 7.2 (H) 04/03/2021    Assessment & Plan    1.  Permanent atrial fibrillation: Rate is well controlled on beta-blocker therapy.  She is anticoagulated with Eliquis and is due for follow-up labs.  CBC and basic metabolic panel today.  2.  Chronic HFpEF: Heart rate and blood pressure well controlled.  Trace lower extremity swelling.  She remains on  Lasix 20 mg daily.  Follow-up basic metabolic panel today.  3.  Hyperlipidemia/hypertriglyceridemia: LDL of 105 with triglycerides of 249 last April.  She remains on fenofibrate and plans to have follow-up lipids at annual visit in late spring.  4.  Left great toe ulceration: Followed by podiatry and pending MRI.  If she requires procedures that would necessitate holding Eliquis, she will be able to hold beginning 2 days prior to the procedure.  5.  Disposition: Follow-up CBC and basic metabolic panel today.  Follow-up in clinic in 6 months or sooner if necessary.   Murray Hodgkins, NP 01/12/2022, 12:39 PM

## 2022-01-12 NOTE — Patient Instructions (Signed)
Medication Instructions:  Your physician recommends that you continue on your current medications as directed. Please refer to the Current Medication list given to you today.  *If you need a refill on your cardiac medications before your next appointment, please call your pharmacy*   Lab Work: CBC and BMET today If you have labs (blood work) drawn today and your tests are completely normal, you will receive your results only by: [place] (if you have MyChart) OR A paper copy in the mail If you have any lab test that is abnormal or we need to change your treatment, we will call you to review the results.   Follow-Up: At [facility], you and your health needs are our priority.  As part of our continuing mission to provide you with exceptional heart care, we have created designated Provider Care Teams.  These Care Teams include your primary Cardiologist (physician) and Advanced Practice Providers (APPs -  Physician Assistants and Nurse Practitioners) who all work together to provide you with the care you need, when you need it.  We recommend signing up for the patient portal called "MyChart".  Sign up information is provided on this After Visit Summary.  MyChart is used to connect with patients for Virtual Visits (Telemedicine).  Patients are able to view lab/test results, encounter notes, upcoming appointments, etc.  Non-urgent messages can be sent to your provider as well.   To learn more about what you can do with MyChart, go to [email or web address].    Your next appointment:   6 month(s)  The format for your next appointment:   In Person  Provider:   Kathlyn Sacramento, MD or Murray Hodgkins, NP{

## 2022-01-12 NOTE — Telephone Encounter (Signed)
Patient is requesting a 90day supply of Fenofibrate for Pharmacy to fill

## 2022-01-13 LAB — CBC
Hematocrit: 45.8 % (ref 34.0–46.6)
Hemoglobin: 15.6 g/dL (ref 11.1–15.9)
MCH: 32 pg (ref 26.6–33.0)
MCHC: 34.1 g/dL (ref 31.5–35.7)
MCV: 94 fL (ref 79–97)
Platelets: 162 10*3/uL (ref 150–450)
RBC: 4.88 x10E6/uL (ref 3.77–5.28)
RDW: 12.1 % (ref 11.7–15.4)
WBC: 5.3 10*3/uL (ref 3.4–10.8)

## 2022-01-13 LAB — BASIC METABOLIC PANEL
BUN/Creatinine Ratio: 23 (ref 12–28)
BUN: 20 mg/dL (ref 8–27)
CO2: 25 mmol/L (ref 20–29)
Calcium: 9.2 mg/dL (ref 8.7–10.3)
Chloride: 101 mmol/L (ref 96–106)
Creatinine, Ser: 0.87 mg/dL (ref 0.57–1.00)
Glucose: 92 mg/dL (ref 70–99)
Potassium: 4.5 mmol/L (ref 3.5–5.2)
Sodium: 140 mmol/L (ref 134–144)
eGFR: 67 mL/min/{1.73_m2} (ref >59)

## 2022-01-14 ENCOUNTER — Telehealth: Payer: Self-pay | Admitting: *Deleted

## 2022-01-14 NOTE — Telephone Encounter (Signed)
-----   Message from Theora Gianotti, NP sent at 01/13/2022  5:21 PM EST ----- Blood counts, electrolytes, and kidney function are normal.

## 2022-01-14 NOTE — Telephone Encounter (Signed)
Left detailed voicemail message that labs were normal and to call back if she should have any questions.

## 2022-01-15 ENCOUNTER — Ambulatory Visit
Admission: RE | Admit: 2022-01-15 | Discharge: 2022-01-15 | Disposition: A | Discharge status: Home / Self Care | Payer: PPO | Source: Ambulatory Visit | Attending: Podiatry | Admitting: Podiatry

## 2022-01-15 DIAGNOSIS — R600 Localized edema: Secondary | ICD-10-CM | POA: Diagnosis not present

## 2022-01-15 DIAGNOSIS — L97529 Non-pressure chronic ulcer of other part of left foot with unspecified severity: Secondary | ICD-10-CM | POA: Diagnosis not present

## 2022-01-15 DIAGNOSIS — M869 Osteomyelitis, unspecified: Secondary | ICD-10-CM

## 2022-01-15 DIAGNOSIS — L97521 Non-pressure chronic ulcer of other part of left foot limited to breakdown of skin: Secondary | ICD-10-CM

## 2022-01-19 ENCOUNTER — Ambulatory Visit: Payer: PPO | Admitting: Podiatry

## 2022-01-19 ENCOUNTER — Encounter: Payer: Self-pay | Admitting: Podiatry

## 2022-01-19 ENCOUNTER — Other Ambulatory Visit: Payer: Self-pay

## 2022-01-19 DIAGNOSIS — M869 Osteomyelitis, unspecified: Secondary | ICD-10-CM | POA: Diagnosis not present

## 2022-01-19 NOTE — Progress Notes (Signed)
Subjective:  81 y.o. female presenting today for follow-up evaluation of an ulcer to the distal tip of the left great toe with cellulitis.  MRI was ordered 01/15/2022 and she presents today to review the results and discuss further treatment options regarding the ulcer to the distal tip of the toe.  Past Medical History:  Diagnosis Date   (HFpEF) heart failure with preserved ejection fraction ([place])    a. 12/2018 Echo: EF 60-65%, no rwma, mild MR. Mildly dil LA. Nl RV fxn. PASP 63mmHg.   Adverse effect of anesthesia 11/18/2015   Arthritis    Coccyalgia 1/60/7371   Complication of anesthesia    trouble waking up with Propofol   Depression    h/o   Family history of adverse reaction to anesthesia    daughter PONV   Glaucoma    left eye   Headache    H/O MIGRAINES   Hemorrhoids    Hiatal hernia    History of kidney stones    History of migraine headaches    History of stress test    a. 10/2014 MV: No ischemia.   Hx MRSA infection    Hypercholesteremia    Motion sickness    boats   Neuropathy    feet, fingers   Osteopenia    Permanent atrial fibrillation ([place]) 10/01/2014   a. CHA2DS2VASc = 3-->eliquis.   PONV (postoperative nausea and vomiting)    Wears dentures    full upper, partial lower   Wears hearing aid in both ears      Objective/Physical Exam General: The patient is alert and oriented x3 in no acute distress.  Dermatology:  Wound #1 noted to the distal tip of the left great toe measuring approximately 0.3 x 0.7 x 0.1 cm (LxWxD).   To the noted ulceration(s), there is no eschar.  Overall the wound is somewhat stable.  No significant change since last visit.  Skin is warm, dry and supple bilateral lower extremities.  Vascular: Palpable pedal pulses bilaterally. Mild edema noted. Capillary refill within normal limits. Varicosities noted bilateral lower extremities.   Neurological: Epicritic and protective threshold diminished bilaterally.   Musculoskeletal  Exam: Range of motion within normal limits to all pedal and ankle joints bilateral. Muscle strength 5/5 in all groups bilateral.   MR FOOT LT WO CONTRAST 01/15/2022: IMPRESSION: 1. Soft tissue ulcer at the tip of the great toe. Bone marrow edema in the tuft of the first distal phalanx concerning for osteomyelitis. No drainable fluid collection to suggest an abscess. 2. Severe soft tissue edema along the dorsal aspect of the foot extending into the toes concerning for cellulitis versus reactive edema.  Assessment: #1  Ulcer distal tip of the left great toe  #2  Osteomyelitis distal tip of the left great toe  Plan of Care:  #1 Patient was evaluated.  MRI reviewed in detail with the patient today 2. Today we discussed the conservative versus surgical management of the presenting pathology.  Findings on the MRI concerning for osteomyelitis.  I do believe it is in the patient's best interest to proceed aggressively to treat the osteo to the distal tip of the toe.  The patient opts for surgical management as well. All possible complications and details of the procedure were explained. All patient questions were answered. No guarantees were expressed or implied. 3. Authorization for surgery was initiated today. Surgery will consist of distal Symes amputation left great toe. 4.  Continue postsurgical shoe 5.  Continue mupirocin ointment  2 times daily to the toe  6.  Continue the oral Levaquin until completed.  Patient states that she has about 8 days left.   7.  Patient will need cardiac clearance prior to surgery.   8.  Return to clinic 1 week postop     [name], DPM [facility]  Dr. [name], [place], [place] [address]                Office [phone]  Fax [phone]

## 2022-01-20 ENCOUNTER — Telehealth: Payer: Self-pay

## 2022-01-20 NOTE — Telephone Encounter (Signed)
Received surgery paperwork from the [place] office. Left a message for Teresa Harris to call me back so we can schedule her surgery with Dr. Amalia Hailey.

## 2022-01-21 ENCOUNTER — Telehealth: Payer: Self-pay | Admitting: Nurse Practitioner

## 2022-01-21 ENCOUNTER — Encounter: Payer: Self-pay | Admitting: Podiatry

## 2022-01-21 NOTE — Telephone Encounter (Signed)
° °  Pre-operative Risk Assessment    Patient Name: Teresa Harris  DOB: [birth year] MRN: [id number]      Request for Surgical Clearance    Procedure:   distal symes amputation left great toe  Date of Surgery:  Clearance 01/28/22                                 Surgeon:  Daylene Katayama, DPM Surgeon's Group or Practice Name:  [facility]  Phone number:  [phone] Fax number:  [phone]   Type of Clearance Requested:   - Medical    Type of Anesthesia:  Not Indicated   Additional requests/questions:    Manfred Arch   01/21/2022, 2:55 PM

## 2022-01-21 NOTE — Telephone Encounter (Signed)
° °  Name: Teresa Harris  DOB: 23-Jul-1941  MRN: [id number]   Primary Cardiologist: Kathlyn Sacramento, MD  Chart reviewed as part of pre-operative protocol coverage. Patient was contacted 01/21/2022 in reference to pre-operative risk assessment for pending surgery as outlined below.  Dandra Shambaugh Fichter was last seen on 01/12/22 by Ignacia Bayley NP.  Since that day, LENOIR FACCHINI has done well.  She does not require additional testing prior amputation. She may hold eliquis for 2 days prior to procedure.   Therefore, based on ACC/AHA guidelines, the patient would be at acceptable risk for the planned procedure without further cardiovascular testing.   The patient was advised that if she develops new symptoms prior to surgery to contact our office to arrange for a follow-up visit, and she verbalized understanding.  I will route this recommendation to the requesting party via Epic fax function and remove from pre-op pool. Please call with questions.  Ledora Bottcher, PA 01/21/2022, 4:57 PM

## 2022-01-21 NOTE — Telephone Encounter (Signed)
° °  Pre-operative Risk Assessment    Patient Name: Teresa Harris  DOB: Mar 24, 1941 MRN: [id number]      Request for Surgical Clearance    Procedure:   TOE AMPUTATION  Date of Surgery:  Clearance 01/28/22                                 Surgeon:   unknown Surgeon's Group or Practice Name:  [facility] Phone number:  [phone] Fax number:  [phone]    Type of Clearance Requested:   - Medical  - Pharmacy:  Hold please advise       Type of Anesthesia:  Not Indicated   Additional requests/questions:   PATIENT CALLING FOR CLEARANCE STATES SURGEON OFFICE TOLD HER SENDING A FORM VIA FAX WOULD TAKE LONGER AND DELAY CARE  . LMOV FOR SURGEON TO OBTAIN NEEDED INFORMATION.   [name]   01/21/2022, 2:23 PM

## 2022-01-22 ENCOUNTER — Telehealth: Payer: Self-pay | Admitting: Urology

## 2022-01-22 NOTE — Telephone Encounter (Signed)
DOS - 01/28/22  AMPUTATION LEFT --- 84784  HTA EFFECTIVE DATE - 12/07/19  RECEIVED FAX FROM [facility] STATING THAT CPT CODE 12820 HAS BEEN APPROVED, AUTH # [id number], GOOD FROM 01/28/22 - 04/28/22.

## 2022-01-28 ENCOUNTER — Other Ambulatory Visit: Payer: Self-pay | Admitting: Podiatry

## 2022-01-28 DIAGNOSIS — M86672 Other chronic osteomyelitis, left ankle and foot: Secondary | ICD-10-CM | POA: Diagnosis not present

## 2022-01-28 DIAGNOSIS — M86472 Chronic osteomyelitis with draining sinus, left ankle and foot: Secondary | ICD-10-CM | POA: Diagnosis not present

## 2022-01-28 DIAGNOSIS — L859 Epidermal thickening, unspecified: Secondary | ICD-10-CM | POA: Diagnosis not present

## 2022-01-28 MED ORDER — TRAMADOL HCL 50 MG PO TABS
50.0000 mg | ORAL_TABLET | ORAL | 0 refills | Status: AC | PRN
Start: 2022-01-28 — End: 2022-02-02

## 2022-01-28 NOTE — Progress Notes (Signed)
PRN postop 

## 2022-01-29 ENCOUNTER — Other Ambulatory Visit: Payer: Self-pay

## 2022-01-29 ENCOUNTER — Telehealth: Payer: Self-pay | Admitting: Podiatry

## 2022-01-29 ENCOUNTER — Ambulatory Visit (INDEPENDENT_AMBULATORY_CARE_PROVIDER_SITE_OTHER): Payer: PPO | Admitting: Podiatry

## 2022-01-29 DIAGNOSIS — L539 Erythematous condition, unspecified: Secondary | ICD-10-CM

## 2022-01-29 DIAGNOSIS — Z89419 Acquired absence of unspecified great toe: Secondary | ICD-10-CM

## 2022-01-29 MED ORDER — SULFAMETHOXAZOLE-TRIMETHOPRIM 800-160 MG PO TABS: 1.0000 | ORAL_TABLET | Freq: Two times a day (BID) | ORAL | 0 refills | Status: AC

## 2022-01-29 NOTE — Telephone Encounter (Signed)
Pts daughter called stating pt had surgery earlier this week(2.23) and is having a lot of bleeding thru the bandage.  I have scheduled pt to see Dr Posey Pronto in [place] office today at 315 as urgent work in.

## 2022-02-02 ENCOUNTER — Other Ambulatory Visit: Payer: Self-pay | Admitting: Family Medicine

## 2022-02-02 DIAGNOSIS — N951 Menopausal and female climacteric states: Secondary | ICD-10-CM

## 2022-02-02 NOTE — Telephone Encounter (Signed)
Requested medication (s) are due for refill today -yes  Requested medication (s) are on the active medication list -yes  Future visit scheduled -yes  Last refill: 11/04/21 #90  Notes to clinic: Request RF: fails mammogram protocol   Requested Prescriptions  Pending Prescriptions Disp Refills   PREMARIN 0.625 MG tablet [Pharmacy Med Name: PREMARIN 0.625 MG TAB] 90 tablet 0    Sig: TAKE 1 TABLET BY MOUTH ONCE DAILY     OB/GYN:  Estrogens Failed - 02/02/2022  9:25 AM      Failed - Mammogram is up-to-date per Health Maintenance      Passed - Last BP in normal range    BP Readings from Last 1 Encounters:  01/12/22 120/80          Passed - Valid encounter within last 12 months    Recent Outpatient Visits           2 months ago Upper respiratory tract infection, unspecified type   [employer], Dani Gobble, PA-C   10 months ago Encounter for subsequent annual wellness visit (AWV) in Medicare patient   [employer], Dionne Bucy, MD   1 year ago Annual physical exam   [place], Clearnce Sorrel, [place]   2 years ago Adverse effect of vaccine, initial encounter   [employer], [place], [place]   2 years ago Acute non-recurrent pansinusitis   [facility] [address] [place], Clearnce Sorrel, [place]       Future Appointments             In 2 months Bacigalupo, Dionne Bucy, MD [employer], [facility]               Requested Prescriptions  Pending Prescriptions Disp Refills   PREMARIN 0.625 MG tablet [Pharmacy Med Name: PREMARIN 0.625 MG TAB] 90 tablet 0    Sig: TAKE 1 TABLET BY MOUTH ONCE DAILY     OB/GYN:  Estrogens Failed - 02/02/2022  9:25 AM      Failed - Mammogram is up-to-date per Health Maintenance      Passed - Last BP in normal range    BP Readings from Last 1 Encounters:  01/12/22 120/80          Passed - Valid encounter within last 12 months    Recent Outpatient Visits            2 months ago Upper respiratory tract infection, unspecified type   [employer], Dani Gobble, PA-C   10 months ago Encounter for subsequent annual wellness visit (AWV) in Medicare patient   [employer], Dionne Bucy, MD   1 year ago Annual physical exam   [place], Clearnce Sorrel, [place]   2 years ago Adverse effect of vaccine, initial encounter   [place], [place], [place]   2 years ago Acute non-recurrent pansinusitis   [facility] [place], Clearnce Sorrel, [place]       Future Appointments             In 2 months Bacigalupo, Dionne Bucy, MD [facility], [place]

## 2022-02-04 ENCOUNTER — Encounter: Payer: Self-pay | Admitting: Podiatry

## 2022-02-04 ENCOUNTER — Other Ambulatory Visit: Payer: Self-pay | Admitting: Family Medicine

## 2022-02-04 DIAGNOSIS — N951 Menopausal and female climacteric states: Secondary | ICD-10-CM

## 2022-02-04 NOTE — Telephone Encounter (Signed)
Duplicate request- signed by office today ?Requested Prescriptions  ?Pending Prescriptions Disp Refills  ?? PREMARIN 0.625 MG tablet [Pharmacy Med Name: PREMARIN 0.625 MG TAB] 90 tablet 0  ?  Sig: TAKE 1 TABLET BY MOUTH ONCE DAILY  ?  ? OB/GYN:  Estrogens Failed - 02/04/2022  1:54 PM  ?  ?  Failed - Mammogram is up-to-date per Health Maintenance  ?  ?  Passed - Last BP in normal range  ?  BP Readings from Last 1 Encounters:  ?01/12/22 120/80  ?   ?  ?  Passed - Valid encounter within last 12 months  ?  Recent Outpatient Visits   ?      ? 2 months ago Upper respiratory tract infection, unspecified type  ? [employer], Dani Gobble, PA-C  ? 10 months ago Encounter for subsequent annual wellness visit (AWV) in Medicare patient  ? [facility] Bacigalupo, Dionne Bucy, MD  ? 1 year ago Annual physical exam  ? [place], [place]  ? 2 years ago Adverse effect of vaccine, initial encounter  ? [place], [place]  ? 2 years ago Acute non-recurrent pansinusitis  ? [place], [place]  ?  ?  ?Future Appointments   ?        ? In 2 months Bacigalupo, Dionne Bucy, MD [facility], [facility]  ?  ? ?  ?  ?  ? ?

## 2022-02-04 NOTE — Progress Notes (Signed)
Subjective:  Patient ID: Teresa Harris, female    DOB: 1941/08/17,  MRN: [id number]  Chief Complaint  Patient presents with   Routine Post Op    Pt stated that she had surgery yesterday and has been bleeding through her bandage     DOS: 01/28/2019 Procedure: Left great toe amputation  81 y.o. female returns for post-op check.  Patient states she is doing okay.  There is some dried blood.  Is healing okay.  Patient has some bleeding through the bandage so wanted to get it evaluated.  There is mild erythema on the incision site.  She wanted to make sure things good.  Bandages clean dry and intact.  Surgery was done by Dr. Amalia Hailey  Review of Systems: Negative except as noted in the HPI. Denies N/V/F/Ch.  Past Medical History:  Diagnosis Date   (HFpEF) heart failure with preserved ejection fraction ([place])    a. 12/2018 Echo: EF 60-65%, no rwma, mild MR. Mildly dil LA. Nl RV fxn. PASP 76mmHg.   Adverse effect of anesthesia 11/18/2015   Arthritis    Coccyalgia 2/63/3354   Complication of anesthesia    trouble waking up with Propofol   Depression    h/o   Family history of adverse reaction to anesthesia    daughter PONV   Glaucoma    left eye   Headache    H/O MIGRAINES   Hemorrhoids    Hiatal hernia    History of kidney stones    History of migraine headaches    History of stress test    a. 10/2014 MV: No ischemia.   Hx MRSA infection    Hypercholesteremia    Motion sickness    boats   Neuropathy    feet, fingers   Osteopenia    Permanent atrial fibrillation ([place]) 10/01/2014   a. CHA2DS2VASc = 3-->eliquis.   PONV (postoperative nausea and vomiting)    Wears dentures    full upper, partial lower   Wears hearing aid in both ears     Current Outpatient Medications:    sulfamethoxazole-trimethoprim (BACTRIM DS) 800-160 MG tablet, Take 1 tablet by mouth 2 (two) times daily for 14 days., Disp: 28 tablet, Rfl: 0   apixaban (ELIQUIS) 5 MG TABS tablet, TAKE 1 TABLET BY MOUTH  TWICE DAILY, Disp: 60 tablet, Rfl: 0   benzonatate (TESSALON) 100 MG capsule, Take 1-2 capsules (100-200 mg total) by mouth 3 (three) times daily as needed for cough. (Patient not taking: Reported on 01/12/2022), Disp: 60 capsule, Rfl: 0   Calcium Carbonate-Vitamin D (CALCIUM-VITAMIN D3 PO), Take 1 tablet by mouth daily. Calcium 600 = D3 1000, Disp: , Rfl:    celecoxib (CELEBREX) 200 MG capsule, Take 200 mg by mouth daily., Disp: , Rfl:    Cholecalciferol (VITAMIN D3) 50 MCG (2000 UT) capsule, Take by mouth in the morning and at bedtime., Disp: , Rfl:    clindamycin (CLEOCIN) 150 MG capsule, Take 1 capsule (150 mg total) by mouth 3 (three) times daily. (Patient not taking: Reported on 01/12/2022), Disp: 30 capsule, Rfl: 1   cycloSPORINE (RESTASIS) 0.05 % ophthalmic emulsion, Place 1 drop into both eyes 2 (two) times daily. , Disp: , Rfl:    dorzolamide-timolol (COSOPT) 22.3-6.8 MG/ML ophthalmic solution, Place 1 drop into both eyes 2 (two) times daily. , Disp: , Rfl:    fenofibrate (TRICOR) 145 MG tablet, TAKE 1 TABLET BY MOUTH ONCE DAILY, Disp: 90 tablet, Rfl: 3   furosemide (LASIX) 20 MG tablet,  TAKE 1 TABLET BY MOUTH ONCE DAILY, Disp: 90 tablet, Rfl: 1   latanoprost (XALATAN) 0.005 % ophthalmic solution, Place 1 drop into both eyes daily. , Disp: , Rfl:    levofloxacin (LEVAQUIN) 750 MG tablet, Take 1 tablet (750 mg total) by mouth daily., Disp: 30 tablet, Rfl: 1   LYRICA 100 MG capsule, TAKE 1 CAPSULE BY MOUTH ONCE DAILY, Disp: 90 capsule, Rfl: 1   Magnesium 400 MG CAPS, Take 400 mg by mouth daily. , Disp: , Rfl:    metoprolol succinate (TOPROL-XL) 25 MG 24 hr tablet, TAKE 1/2 TABLET BY MOUTH ONCE DAILY, Disp: 45 tablet, Rfl: 1   METRONIDAZOLE, TOPICAL, 0.75 % LOTN, Apply topically daily. Apply to face in AM, Disp: , Rfl:    Multiple Vitamin (MULTIVITAMIN) tablet, Take 1 tablet by mouth daily., Disp: , Rfl:    mupirocin ointment (BACTROBAN) 2 %, Apply 1 application topically 2 (two) times daily.  (Patient not taking: Reported on 01/12/2022), Disp: 22 g, Rfl: 0   NON FORMULARY, Place 12 sprays into both nostrils 6 (six) times daily. 250GU irrigant, Disp: , Rfl:    Omega-3 Fatty Acids (FISH OIL) 1200 MG CAPS, Take by mouth 2 (two) times daily., Disp: , Rfl:    omeprazole (PRILOSEC) 40 MG capsule, Take 40 mg by mouth every morning. , Disp: , Rfl:    predniSONE (DELTASONE) 50 MG tablet, Take 1 tablet (50 mg total) by mouth daily with breakfast. (Patient not taking: Reported on 01/12/2022), Disp: 5 tablet, Rfl: 0   PREMARIN 0.625 MG tablet, TAKE 1 TABLET BY MOUTH ONCE DAILY, Disp: 90 tablet, Rfl: 0   promethazine-dextromethorphan (PROMETHAZINE-DM) 6.25-15 MG/5ML syrup, Take 5 mLs by mouth at bedtime as needed for cough. (Patient not taking: Reported on 01/12/2022), Disp: 100 mL, Rfl: 0   SOOLANTRA 1 % CREA, Apply 1 application topically daily. Sometimes uses twice daily as needed for flare ups., Disp: , Rfl:   Social History   Tobacco Use  Smoking Status Former   Packs/day: 1.00   Years: 30.00   Pack years: 30.00   Types: Cigarettes   Quit date: 02/09/1996   Years since quitting: 26.0  Smokeless Tobacco Never    Allergies  Allergen Reactions   [name] [Moxifloxacin Hcl In Nacl] Other (See Comments)    Headache   Bimatoprost Other (See Comments)    Made my eyes red, and hurt   Brimonidine Other (See Comments)    Made my eyes red, and hurt Burned eyes.   Clindamycin/Lincomycin Diarrhea    Severe diarrhea   Eszopiclone    Mobic [Meloxicam] Other (See Comments)    headache   Propofol Other (See Comments)    Had difficulty waking up Difficult to wake up.   Travoprost Other (See Comments)    Unknown   Vicodin [Hydrocodone-Acetaminophen] Nausea And Vomiting and Other (See Comments)    Sick on stomach   Cephalexin Nausea And Vomiting and Other (See Comments)    headache   Objective:  There were no vitals filed for this visit. There is no height or weight on file to calculate  BMI. Constitutional Well developed. Well nourished.  Vascular Foot warm and well perfused. Capillary refill normal to all digits.   Neurologic Normal speech. Oriented to person, place, and time. Epicritic sensation to light touch grossly present bilaterally.  Dermatologic Skin healing well without signs of infection. Skin edges well coapted without signs of infection.  Mild erythema noted around incision site  Orthopedic: Tenderness to palpation  noted about the surgical site.   Radiographs: None Assessment:   1. Erythema   2. History of amputation of great toe [facility])    Plan:  Patient was evaluated and treated and all questions answered.  S/p foot surgery left -Progressing as expected post-operatively. -XR: None -WB Status: Weightbearing as tolerated in surgical shoe -Sutures: Intact.  No clinical signs of Deis is noted.  No complication noted. -Medications: Bactrim for skin and soft tissue prophylaxis -Foot redressed.  No follow-ups on file.

## 2022-02-05 ENCOUNTER — Other Ambulatory Visit: Payer: Self-pay

## 2022-02-05 ENCOUNTER — Ambulatory Visit (INDEPENDENT_AMBULATORY_CARE_PROVIDER_SITE_OTHER): Payer: PPO

## 2022-02-05 ENCOUNTER — Ambulatory Visit (INDEPENDENT_AMBULATORY_CARE_PROVIDER_SITE_OTHER): Payer: PPO | Admitting: Podiatry

## 2022-02-05 DIAGNOSIS — M869 Osteomyelitis, unspecified: Secondary | ICD-10-CM

## 2022-02-05 DIAGNOSIS — Z9889 Other specified postprocedural states: Secondary | ICD-10-CM

## 2022-02-05 DIAGNOSIS — M86172 Other acute osteomyelitis, left ankle and foot: Secondary | ICD-10-CM

## 2022-02-05 DIAGNOSIS — H401133 Primary open-angle glaucoma, bilateral, severe stage: Secondary | ICD-10-CM | POA: Diagnosis not present

## 2022-02-05 MED ORDER — FLUCONAZOLE 150 MG PO TABS: 150.0000 mg | ORAL_TABLET | Freq: Once | ORAL | 3 refills | Status: AC

## 2022-02-05 NOTE — Progress Notes (Signed)
? ?  Subjective:  ?Patient presents today status post distal Symes amputation of the left great toe. DOS: 01/28/2022.  Patient states that she is doing well.  She does have some pain but overall she it is very tolerable.  She has been weightbearing as tolerated in the postsurgical shoe.  No new complaints at this time ? ?Past Medical History:  ?Diagnosis Date  ? (HFpEF) heart failure with preserved ejection fraction ([place])   ? a. 12/2018 Echo: EF 60-65%, no rwma, mild MR. Mildly dil LA. Nl RV fxn. PASP 9mmHg.  ? Adverse effect of anesthesia 11/18/2015  ? Arthritis   ? Coccyalgia 01/27/2018  ? Complication of anesthesia   ? trouble waking up with Propofol  ? Depression   ? h/o  ? Family history of adverse reaction to anesthesia   ? daughter PONV  ? Glaucoma   ? left eye  ? Headache   ? H/O MIGRAINES  ? Hemorrhoids   ? Hiatal hernia   ? History of kidney stones   ? History of migraine headaches   ? History of stress test   ? a. 10/2014 MV: No ischemia.  ? Hx MRSA infection   ? Hypercholesteremia   ? Motion sickness   ? boats  ? Neuropathy   ? feet, fingers  ? Osteopenia   ? Permanent atrial fibrillation ([place]) 10/01/2014  ? a. CHA2DS2VASc = 3-->eliquis.  ? PONV (postoperative nausea and vomiting)   ? Wears dentures   ? full upper, partial lower  ? Wears hearing aid in both ears   ? ?  ? ?Objective/Physical Exam ?Neurovascular status intact.  Skin incisions appear to be well coapted with sutures and staples intact. No sign of infectious process noted. No dehiscence. No active bleeding noted. Moderate edema noted to the surgical extremity. ? ?Radiographic Exam:  ?Osteotomy appears stable and clean with good routine healing to the distal phalanx of the left great toe.  Stainless steel skin staples noted along the distal aspect of the toe ? ?Assessment: ?1. s/p distal Symes amputation left great toe. DOS: 01/28/2022 ? ? ?Plan of Care:  ?1. Patient was evaluated. X-rays reviewed ?2.  Dressings changed.  Overall the patient is  doing very well.  She may begin washing and showering and getting the foot wet. ?3.  Continue mupirocin ointment to the incision site daily with a light dressing ?4.  Continue weightbearing in the postsurgical shoe ?5.  Return to clinic in 1 week ? ? ?[name], DPM ?[place] ? ?Dr. [name], DPM  ?  ?[address]                                    ?[place], [place] [address]                ?Office [phone]  ?Fax [phone] ? ? ? ? ? ?

## 2022-02-12 ENCOUNTER — Encounter: Payer: PPO | Admitting: Podiatry

## 2022-02-15 ENCOUNTER — Encounter: Payer: Self-pay | Admitting: Podiatry

## 2022-02-16 ENCOUNTER — Ambulatory Visit (INDEPENDENT_AMBULATORY_CARE_PROVIDER_SITE_OTHER): Payer: PPO | Admitting: Podiatry

## 2022-02-16 ENCOUNTER — Other Ambulatory Visit: Payer: Self-pay

## 2022-02-16 ENCOUNTER — Encounter: Payer: Self-pay | Admitting: Podiatry

## 2022-02-16 DIAGNOSIS — Z9889 Other specified postprocedural states: Secondary | ICD-10-CM

## 2022-02-16 MED ORDER — SULFAMETHOXAZOLE-TRIMETHOPRIM 800-160 MG PO TABS: 1.0000 | ORAL_TABLET | Freq: Two times a day (BID) | ORAL | 0 refills | Status: DC

## 2022-02-16 NOTE — Progress Notes (Signed)
? ?  Subjective:  ?Patient presents today status post distal Symes amputation of the left great toe. DOS: 01/28/2022.  Patient states that she has noticed some increased drainage and bleeding to the surgical site.  She has worn the postsurgical shoe and has been applying dressings to the toe daily. ? ?Past Medical History:  ?Diagnosis Date  ? (HFpEF) heart failure with preserved ejection fraction ([place])   ? a. 12/2018 Echo: EF 60-65%, no rwma, mild MR. Mildly dil LA. Nl RV fxn. PASP 77mHg.  ? Adverse effect of anesthesia 11/18/2015  ? Arthritis   ? Coccyalgia 01/27/2018  ? Complication of anesthesia   ? trouble waking up with Propofol  ? Depression   ? h/o  ? Family history of adverse reaction to anesthesia   ? daughter PONV  ? Glaucoma   ? left eye  ? Headache   ? H/O MIGRAINES  ? Hemorrhoids   ? Hiatal hernia   ? History of kidney stones   ? History of migraine headaches   ? History of stress test   ? a. 10/2014 MV: No ischemia.  ? Hx MRSA infection   ? Hypercholesteremia   ? Motion sickness   ? boats  ? Neuropathy   ? feet, fingers  ? Osteopenia   ? Permanent atrial fibrillation ([place] 10/01/2014  ? a. CHA2DS2VASc = 3-->eliquis.  ? PONV (postoperative nausea and vomiting)   ? Wears dentures   ? full upper, partial lower  ? Wears hearing aid in both ears   ? ? ? ? ?Objective/Physical Exam ?Neurovascular status intact.  Skin incisions appear to be well coapted with staples intact.  After removal of the staples there is a portion to the medial aspect of the incision site that is not quite well coapted and healed.  There is some soft tissue breakdown in the area.  Please see above noted photo.  No malodor.  Sanguinous drainage.  No purulence.  Minimal erythema and edema around the toe. ? ?Assessment: ?1. s/p distal Symes amputation left great toe. DOS: 01/28/2022 ? ? ?Plan of Care:  ?1. Patient was evaluated.  ?2.  Staples removed ?3.  Light debridement of the medial portion of the great toe was performed using a tissue  nipper.  Antibiotic cream and a light dressing was applied.  Leave clean dry and intact until next appointment ?4.  Continue minimal weightbearing in the postsurgical shoe ?5.  Return to clinic this Friday, 02/19/2022, for dressing change and reevaluation ?6.  Refill prescription for Bactrim DS ? ?BEdrick Kins DPM ?[place]? ?Dr. BEdrick Kins DPM  ?  ?[address]                                    ?[place] [address]               ?Office [phone] ?Fax [phone]? ? ? ? ? ?

## 2022-02-19 ENCOUNTER — Encounter: Payer: Self-pay | Admitting: Podiatry

## 2022-02-19 ENCOUNTER — Other Ambulatory Visit: Payer: Self-pay

## 2022-02-19 ENCOUNTER — Ambulatory Visit (INDEPENDENT_AMBULATORY_CARE_PROVIDER_SITE_OTHER): Payer: PPO

## 2022-02-19 ENCOUNTER — Ambulatory Visit (INDEPENDENT_AMBULATORY_CARE_PROVIDER_SITE_OTHER): Payer: PPO | Admitting: Podiatry

## 2022-02-19 DIAGNOSIS — Z9889 Other specified postprocedural states: Secondary | ICD-10-CM

## 2022-02-19 NOTE — Progress Notes (Signed)
? ?  Subjective:  ?Patient presents today status post distal Symes amputation of the left great toe. DOS: 01/28/2022.  Patient doing well.  She has kept the dressings clean dry and intact since she was last seen in the office a few days ago.  She presents for further treatment and evaluation ? ?Past Medical History:  ?Diagnosis Date  ? (HFpEF) heart failure with preserved ejection fraction ([place])   ? a. 12/2018 Echo: EF 60-65%, no rwma, mild MR. Mildly dil LA. Nl RV fxn. PASP 34mHg.  ? Adverse effect of anesthesia 11/18/2015  ? Arthritis   ? Coccyalgia 01/27/2018  ? Complication of anesthesia   ? trouble waking up with Propofol  ? Depression   ? h/o  ? Family history of adverse reaction to anesthesia   ? daughter PONV  ? Glaucoma   ? left eye  ? Headache   ? H/O MIGRAINES  ? Hemorrhoids   ? Hiatal hernia   ? History of kidney stones   ? History of migraine headaches   ? History of stress test   ? a. 10/2014 MV: No ischemia.  ? Hx MRSA infection   ? Hypercholesteremia   ? Motion sickness   ? boats  ? Neuropathy   ? feet, fingers  ? Osteopenia   ? Permanent atrial fibrillation ([place] 10/01/2014  ? a. CHA2DS2VASc = 3-->eliquis.  ? PONV (postoperative nausea and vomiting)   ? Wears dentures   ? full upper, partial lower  ? Wears hearing aid in both ears   ? ?Objective/Physical Exam ?Neurovascular status intact.  Skin incision is healed with exception of the small ulcer to the medial aspect of the toe.  Mostly granular wound base.  There does appear to be some significant improvement since last visit.  No malodor.  Minimal serous drainage. ? ?Assessment: ?1. s/p distal Symes amputation left great toe. DOS: 01/28/2022 ? ? ?Plan of Care:  ?1. Patient was evaluated.  Pending culture results reviewed today.  No growth ?2.  Additional light debridement of the medial portion of the great toe was performed using a tissue nipper.   ?4.  Continue minimal weightbearing in the postsurgical shoe ?5.  Continue Bactrim DS as prescribed ?6.   Prisma collagen and Hydrofera Blue was dispensed and provided for the patient to begin applying daily to the small wound to the medial aspect of the amputation site.  He was demonstrated how to apply ?7.  Return to clinic in 1 week ? ?BEdrick Kins DPM ?[place]? ?Dr. BEdrick Kins DPM  ?  ?[address]                                    ?[place] [address]               ?Office [phone] ?Fax [phone]? ? ? ? ? ?

## 2022-02-25 ENCOUNTER — Encounter: Payer: Self-pay | Admitting: Podiatry

## 2022-02-26 ENCOUNTER — Other Ambulatory Visit: Payer: Self-pay

## 2022-02-26 ENCOUNTER — Ambulatory Visit (INDEPENDENT_AMBULATORY_CARE_PROVIDER_SITE_OTHER): Payer: PPO | Admitting: Podiatry

## 2022-02-26 DIAGNOSIS — Z9889 Other specified postprocedural states: Secondary | ICD-10-CM

## 2022-03-07 NOTE — Progress Notes (Signed)
? ?  Subjective:  ?Patient presents today status post distal Symes amputation of the left great toe. DOS: 01/28/2022.  Patient is doing well.  She believes it looks much better.  She has been applying the Prisma collagen dressing and Hydrofera Blue as instructed.  No new complaints at this time ? ?Past Medical History:  ?Diagnosis Date  ? (HFpEF) heart failure with preserved ejection fraction ([place])   ? a. 12/2018 Echo: EF 60-65%, no rwma, mild MR. Mildly dil LA. Nl RV fxn. PASP 14mHg.  ? Adverse effect of anesthesia 11/18/2015  ? Arthritis   ? Coccyalgia 01/27/2018  ? Complication of anesthesia   ? trouble waking up with Propofol  ? Depression   ? h/o  ? Family history of adverse reaction to anesthesia   ? daughter PONV  ? Glaucoma   ? left eye  ? Headache   ? H/O MIGRAINES  ? Hemorrhoids   ? Hiatal hernia   ? History of kidney stones   ? History of migraine headaches   ? History of stress test   ? a. 10/2014 MV: No ischemia.  ? Hx MRSA infection   ? Hypercholesteremia   ? Motion sickness   ? boats  ? Neuropathy   ? feet, fingers  ? Osteopenia   ? Permanent atrial fibrillation ([place] 10/01/2014  ? a. CHA2DS2VASc = 3-->eliquis.  ? PONV (postoperative nausea and vomiting)   ? Wears dentures   ? full upper, partial lower  ? Wears hearing aid in both ears   ? ?Objective/Physical Exam ?Neurovascular status intact.  Overall there is appears to be good healing of the wound to the medial aspect of the toe.  Minimal.  No malodor.  Granular wound base ? ?Assessment: ?1. s/p distal Symes amputation left great toe. DOS: 01/28/2022 ? ? ?Plan of Care:  ?1. Patient was evaluated.   ?2.  Continue Hydrofera Blue to the wound.  Discontinue Prisma collagen dressing ?3.  Continue postoperative shoe ?4.  Return to clinic in 10 days ? ?BEdrick Kins DPM ?[place]? ?Dr. BEdrick Kins DPM  ?  ?[address]                                    ?[place] [address]               ?Office [phone] ?Fax [phone] ? ? ? ? ?

## 2022-03-09 ENCOUNTER — Ambulatory Visit (INDEPENDENT_AMBULATORY_CARE_PROVIDER_SITE_OTHER): Payer: PPO | Admitting: Podiatry

## 2022-03-09 ENCOUNTER — Encounter: Payer: Self-pay | Admitting: Podiatry

## 2022-03-09 DIAGNOSIS — Z9889 Other specified postprocedural states: Secondary | ICD-10-CM

## 2022-03-09 NOTE — Progress Notes (Signed)
? ?  Subjective:  ?Patient presents today status post distal Symes amputation of the left great toe. DOS: 01/28/2022.  Patient is doing well.  She believes it looks much better.  Patient continues to see improvement.  No new complaints at this time ? ?Past Medical History:  ?Diagnosis Date  ? (HFpEF) heart failure with preserved ejection fraction ([place])   ? a. 12/2018 Echo: EF 60-65%, no rwma, mild MR. Mildly dil LA. Nl RV fxn. PASP 88mHg.  ? Adverse effect of anesthesia 11/18/2015  ? Arthritis   ? Coccyalgia 01/27/2018  ? Complication of anesthesia   ? trouble waking up with Propofol  ? Depression   ? h/o  ? Family history of adverse reaction to anesthesia   ? daughter PONV  ? Glaucoma   ? left eye  ? Headache   ? H/O MIGRAINES  ? Hemorrhoids   ? Hiatal hernia   ? History of kidney stones   ? History of migraine headaches   ? History of stress test   ? a. 10/2014 MV: No ischemia.  ? Hx MRSA infection   ? Hypercholesteremia   ? Motion sickness   ? boats  ? Neuropathy   ? feet, fingers  ? Osteopenia   ? Permanent atrial fibrillation ([place] 10/01/2014  ? a. CHA2DS2VASc = 3-->eliquis.  ? PONV (postoperative nausea and vomiting)   ? Wears dentures   ? full upper, partial lower  ? Wears hearing aid in both ears   ? ?Objective/Physical Exam ?Neurovascular status intact.  There continues to be good improvement of the wound.  There is only a small area measuring approximately 0.3 x 0.3 x 0.1 cm.  Granular wound base.  No clinical evidence of infection.  Good bleeding with debridement ? ?Assessment: ?1. s/p distal Symes amputation left great toe. DOS: 01/28/2022 ? ? ?Plan of Care:  ?1. Patient was evaluated.  Overall there is significant improvement of the wound and dehiscence site ?2.  Light debridement was performed using a tissue nipper.  Unfortunately we do not have any Hydrofera Blue stocked in our office today.  Prisma collagen dressing was provided to apply daily ?3.  Cover with light Band-Aid ?4.  Continue postsurgical  shoe ?5.  Return to clinic 3 weeks ? ?BEdrick Kins DPM ?[place]? ?Dr. BEdrick Kins DPM  ?  ?[address]                                    ?[place] [address]               ?Office [phone] ?Fax [phone] ? ? ? ? ?

## 2022-03-15 ENCOUNTER — Other Ambulatory Visit: Payer: Self-pay | Admitting: Cardiovascular Disease

## 2022-03-15 DIAGNOSIS — I482 Chronic atrial fibrillation, unspecified: Secondary | ICD-10-CM

## 2022-03-15 NOTE — Telephone Encounter (Signed)
Eliquis 5 mg refill request received. Patient is 81 years old, weight- 88.5 kg, Crea- 0.87 on 01/12/22 , Diagnosis- afib, and last seen by Ignacia Bayley, NP on 01/12/22. Dose is appropriate based on dosing criteria. Will send in refill to requested pharmacy.   ?

## 2022-03-15 NOTE — Telephone Encounter (Signed)
REFILL REQUEST

## 2022-03-23 ENCOUNTER — Ambulatory Visit (INDEPENDENT_AMBULATORY_CARE_PROVIDER_SITE_OTHER): Payer: PPO | Admitting: Podiatry

## 2022-03-23 DIAGNOSIS — Z9889 Other specified postprocedural states: Secondary | ICD-10-CM

## 2022-04-02 ENCOUNTER — Telehealth: Payer: Self-pay | Admitting: Podiatry

## 2022-04-02 MED ORDER — MUPIROCIN 2 % EX OINT: 1.0000 "application " | TOPICAL_OINTMENT | Freq: Two times a day (BID) | CUTANEOUS | 1 refills | Status: DC

## 2022-04-02 NOTE — Telephone Encounter (Signed)
Patient called to get refill  on mupirocin ointment (BACTROBAN) 2 % [[id number]]    Pharmacy [facility] in [place]   Please advise

## 2022-04-02 NOTE — Telephone Encounter (Signed)
Per Dr. Amalia Hailey verbal order, ok to refill.  Script has been sent to [facility]. ?

## 2022-04-11 NOTE — Progress Notes (Signed)
? ?  Subjective:  ?Patient presents today status post distal Symes amputation of the left great toe. DOS: 01/28/2022.  Patient states that she has been bandaging the toe and overall it is doing better.  She presents for further treatment and evaluation ? ?Past Medical History:  ?Diagnosis Date  ? (HFpEF) heart failure with preserved ejection fraction ([place])   ? a. 12/2018 Echo: EF 60-65%, no rwma, mild MR. Mildly dil LA. Nl RV fxn. PASP 58mHg.  ? Adverse effect of anesthesia 11/18/2015  ? Arthritis   ? Coccyalgia 01/27/2018  ? Complication of anesthesia   ? trouble waking up with Propofol  ? Depression   ? h/o  ? Family history of adverse reaction to anesthesia   ? daughter PONV  ? Glaucoma   ? left eye  ? Headache   ? H/O MIGRAINES  ? Hemorrhoids   ? Hiatal hernia   ? History of kidney stones   ? History of migraine headaches   ? History of stress test   ? a. 10/2014 MV: No ischemia.  ? Hx MRSA infection   ? Hypercholesteremia   ? Motion sickness   ? boats  ? Neuropathy   ? feet, fingers  ? Osteopenia   ? Permanent atrial fibrillation ([place] 10/01/2014  ? a. CHA2DS2VASc = 3-->eliquis.  ? PONV (postoperative nausea and vomiting)   ? Wears dentures   ? full upper, partial lower  ? Wears hearing aid in both ears   ? ? ? ? ?Objective/Physical Exam ?Neurovascular status intact.  There continues to be good improvement of the wound.  There is a good granular wound base with healthy bleeding.  Clinically there is no concern for vascular compromise.  No malodor.  No erythema or edema around the area.  Also clinically there is no concern for infection ? ?Assessment: ?1. s/p distal Symes amputation left great toe. DOS: 01/28/2022 ? ? ?Plan of Care:  ?1. Patient was evaluated.   ?2.  Continue antibiotic gentamicin cream with a light dressing or Band-Aid ?3.  Continue postsurgical shoe ?4.  Return to clinic 3 weeks ? ?BEdrick Kins DPM ?[place]? ?Dr. BEdrick Kins DPM  ?  ?[address]                                     ?[place] [address]               ?Office ([phone] ?Fax [phone]? ? ? ? ? ?

## 2022-04-12 NOTE — Progress Notes (Signed)
? ? ?I,Teresa Harris,acting as a scribe for Lavon Paganini, MD.,have documented all relevant documentation on the behalf of Lavon Paganini, MD,as directed by  Lavon Paganini, MD while in the presence of Lavon Paganini, MD. ? ? ?Annual Wellness Visit ? ?  ? ?Patient: Teresa Harris, Female    DOB: 1941/01/03, 81 y.o.   MRN: [id number] ?Visit Date: 04/13/2022 ? ?Today's Provider: Lavon Paganini, MD  ? ?Chief Complaint  ?Patient presents with  ?? Medicare Wellness  ? ?Subjective  ?  ?Teresa Harris is a 81 y.o. female who presents today for her Annual Wellness Visit. ?She reports consuming a general diet. The patient does not participate in regular exercise at present. She generally feels well. She reports sleeping well. She does not have additional problems to discuss today.  ? ?HPI ? ? ? ?Medications: ?Outpatient Medications Prior to Visit  ?Medication Sig  ?? apixaban (ELIQUIS) 5 MG TABS tablet TAKE 1 TABLET BY MOUTH TWICE DAILY  ?? Calcium Carbonate-Vitamin D (CALCIUM-VITAMIN D3 PO) Take 1 tablet by mouth daily. Calcium 600 = D3 1000  ?? celecoxib (CELEBREX) 200 MG capsule Take 200 mg by mouth daily.  ?? Cholecalciferol (VITAMIN D3) 50 MCG (2000 UT) capsule Take by mouth in the morning and at bedtime.  ?? cycloSPORINE (RESTASIS) 0.05 % ophthalmic emulsion Place 1 drop into both eyes 2 (two) times daily.   ?? dorzolamide-timolol (COSOPT) 22.3-6.8 MG/ML ophthalmic solution Place 1 drop into both eyes 2 (two) times daily.   ?? fenofibrate (TRICOR) 145 MG tablet TAKE 1 TABLET BY MOUTH ONCE DAILY  ?? furosemide (LASIX) 20 MG tablet TAKE 1 TABLET BY MOUTH ONCE DAILY  ?? latanoprost (XALATAN) 0.005 % ophthalmic solution Place 1 drop into both eyes daily.   ?? LYRICA 100 MG capsule TAKE 1 CAPSULE BY MOUTH ONCE DAILY  ?? Magnesium 400 MG CAPS Take 400 mg by mouth daily.   ?? metoprolol succinate (TOPROL-XL) 25 MG 24 hr tablet TAKE 1/2 TABLET BY MOUTH ONCE DAILY  ?? METRONIDAZOLE, TOPICAL, 0.75 % LOTN  Apply topically daily. Apply to face in AM  ?? Multiple Vitamin (MULTIVITAMIN) tablet Take 1 tablet by mouth daily.  ?? mupirocin ointment (BACTROBAN) 2 % Apply 1 application topically 2 (two) times daily.  ?? NON FORMULARY Place 12 sprays into both nostrils 6 (six) times daily. 250GU irrigant  ?? Omega-3 Fatty Acids (FISH OIL) 1200 MG CAPS Take by mouth 2 (two) times daily.  ?? omeprazole (PRILOSEC) 40 MG capsule Take 40 mg by mouth every morning.   ?? PREMARIN 0.625 MG tablet TAKE 1 TABLET BY MOUTH ONCE DAILY  ?? SOOLANTRA 1 % CREA Apply 1 application topically daily. Sometimes uses twice daily as needed for flare ups.  ?? [DISCONTINUED] mupirocin ointment (BACTROBAN) 2 % Place 1 application. into the nose 2 (two) times daily. (Patient not taking: Reported on 04/13/2022)  ?? [DISCONTINUED] sulfamethoxazole-trimethoprim (BACTRIM DS) 800-160 MG tablet Take 1 tablet by mouth 2 (two) times daily. (Patient not taking: Reported on 04/13/2022)  ? ?No facility-administered medications prior to visit.  ?  ?Allergies  ?Allergen Reactions  ?? [name] [Moxifloxacin Hcl In Nacl] Other (See Comments)  ?  Headache  ?? Bimatoprost Other (See Comments)  ?  Made my eyes red, and hurt  ?? Brimonidine Other (See Comments)  ?  Made my eyes red, and hurt ?Burned eyes.  ?? Clindamycin/Lincomycin Diarrhea  ?  Severe diarrhea  ?? Eszopiclone   ?? Mobic [Meloxicam] Other (See Comments)  ?  headache  ?? Propofol Other (See Comments)  ?  Had difficulty waking up ?Difficult to wake up.  ?? Travoprost Other (See Comments)  ?  Unknown  ?? Vicodin [Hydrocodone-Acetaminophen] Nausea And Vomiting and Other (See Comments)  ?  Sick on stomach  ?? Cephalexin Nausea And Vomiting and Other (See Comments)  ?  headache  ? ? ?Patient Care Team: ?Virginia Crews, MD as PCP - General (Family Medicine) ?Wellington Hampshire, MD as PCP - Cardiology (Cardiology) ?Wellington Hampshire, MD as Consulting Physician (Cardiology) ?Eulogio Bear, MD as Consulting  Physician (Ophthalmology) ?Sharlet Salina, MD as Referring Physician (Physical Medicine and Rehabilitation) ?Beverly Gust, MD (Otolaryngology) ? ?Review of Systems  ?Constitutional:  Positive for activity change.  ?HENT:  Positive for congestion and sinus pressure.   ?Eyes:  Positive for photophobia and visual disturbance.  ?All other systems reviewed and are negative. ? ?Last CBC ?Lab Results  ?Component Value Date  ? WBC 5.3 01/12/2022  ? HGB 15.6 01/12/2022  ? HCT 45.8 01/12/2022  ? MCV 94 01/12/2022  ? MCH 32.0 01/12/2022  ? RDW 12.1 01/12/2022  ? PLT 162 01/12/2022  ? ?Last metabolic panel ?Lab Results  ?Component Value Date  ? GLUCOSE 92 01/12/2022  ? NA 140 01/12/2022  ? K 4.5 01/12/2022  ? CL 101 01/12/2022  ? CO2 25 01/12/2022  ? BUN 20 01/12/2022  ? CREATININE 0.87 01/12/2022  ? EGFR 67 01/12/2022  ? CALCIUM 9.2 01/12/2022  ? PROT 7.3 04/03/2021  ? ALBUMIN 4.4 04/03/2021  ? LABGLOB 2.9 04/03/2021  ? AGRATIO 1.5 04/03/2021  ? BILITOT 0.6 04/03/2021  ? ALKPHOS 59 04/03/2021  ? AST 26 04/03/2021  ? ALT 28 04/03/2021  ? ANIONGAP 10 07/17/2020  ? ?Last lipids ?Lab Results  ?Component Value Date  ? CHOL 189 04/03/2021  ? HDL 41 04/03/2021  ? [place] 105 (H) 04/03/2021  ? TRIG 249 (H) 04/03/2021  ? CHOLHDL 3.7 07/17/2020  ? ?Last hemoglobin A1c ?Lab Results  ?Component Value Date  ? HGBA1C 7.2 (H) 04/03/2021  ? ?Last thyroid functions ?Lab Results  ?Component Value Date  ? TSH 1.901 12/07/2018  ? ?  ?  ? Objective  ?  ?Vitals: BP 120/86 (BP Location: Left Arm, Patient Position: Sitting, Cuff Size: Large)   Pulse 86   Temp 98 ?F (36.7 ?C) (Oral)   Resp 16   Ht 5' 8"  (1.727 m)   Wt 193 lb 12.8 oz (87.9 kg)   BMI 29.47 kg/m?  ?BP Readings from Last 3 Encounters:  ?04/13/22 120/86  ?01/12/22 120/80  ?12/04/21 130/81  ? ?Wt Readings from Last 3 Encounters:  ?04/13/22 193 lb 12.8 oz (87.9 kg)  ?01/12/22 195 lb (88.5 kg)  ?12/04/21 192 lb (87.1 kg)  ? ?  ?Physical Exam ?Vitals reviewed.  ?Constitutional:    ?   General: She is not in acute distress. ?   Appearance: Normal appearance. She is well-developed. She is not diaphoretic.  ?HENT:  ?   Head: Normocephalic and atraumatic.  ?   Right Ear: External ear normal.  ?   Left Ear: External ear normal.  ?   Ears:  ?   Comments: Hearing aids in place ?   Nose: Nose normal.  ?   Mouth/Throat:  ?   Mouth: Mucous membranes are moist.  ?   Pharynx: Oropharynx is clear. No oropharyngeal exudate.  ?Eyes:  ?   General: No scleral icterus. ?   Conjunctiva/sclera: Conjunctivae normal.  ?  Pupils: Pupils are equal, round, and reactive to light.  ?Neck:  ?   Thyroid: No thyromegaly.  ?Cardiovascular:  ?   Rate and Rhythm: Normal rate. Rhythm irregularly irregular.  ?   Pulses: Normal pulses.  ?   Heart sounds: Normal heart sounds. No murmur heard. ?Pulmonary:  ?   Effort: Pulmonary effort is normal. No respiratory distress.  ?   Breath sounds: Normal breath sounds. No wheezing or rales.  ?Abdominal:  ?   General: There is no distension.  ?   Palpations: Abdomen is soft.  ?   Tenderness: There is no abdominal tenderness.  ?Musculoskeletal:     ?   General: No deformity.  ?   Cervical back: Neck supple.  ?   Right lower leg: No edema.  ?   Left lower leg: No edema.  ?   Comments: Compression socks in place  ?Lymphadenopathy:  ?   Cervical: No cervical adenopathy.  ?Skin: ?   General: Skin is warm and dry.  ?Neurological:  ?   Mental Status: She is alert and oriented to person, place, and time. Mental status is at baseline.  ?   Gait: Gait normal.  ?Psychiatric:     ?   Mood and Affect: Mood normal.     ?   Behavior: Behavior normal.     ?   Thought Content: Thought content normal.  ? ? ? ?Most recent functional status assessment: ? ?  04/13/2022  ? 10:33 AM  ?In your present state of health, do you have any difficulty performing the following activities:  ?Hearing? 1  ?Vision? 1  ?Difficulty concentrating or making decisions? 0  ?Walking or climbing stairs? 1  ?Dressing or bathing? 1   ?Doing errands, shopping? 1  ? ?Most recent fall risk assessment: ? ?  04/13/2022  ? 10:32 AM  ?Fall Risk   ?Falls in the past year? 0  ?Number falls in past yr: 0  ?Injury with Fall? 0  ?Risk for fall due to : N

## 2022-04-13 ENCOUNTER — Encounter: Payer: Self-pay | Admitting: Family Medicine

## 2022-04-13 ENCOUNTER — Encounter: Payer: Self-pay | Admitting: Podiatry

## 2022-04-13 ENCOUNTER — Ambulatory Visit (INDEPENDENT_AMBULATORY_CARE_PROVIDER_SITE_OTHER): Payer: PPO | Admitting: Podiatry

## 2022-04-13 ENCOUNTER — Ambulatory Visit (INDEPENDENT_AMBULATORY_CARE_PROVIDER_SITE_OTHER): Payer: PPO | Admitting: Family Medicine

## 2022-04-13 VITALS — BP 120/86 | HR 86 | Temp 98.0°F | Resp 16 | Ht 68.0 in | Wt 193.8 lb

## 2022-04-13 DIAGNOSIS — E1169 Type 2 diabetes mellitus with other specified complication: Secondary | ICD-10-CM | POA: Diagnosis not present

## 2022-04-13 DIAGNOSIS — D6869 Other thrombophilia: Secondary | ICD-10-CM

## 2022-04-13 DIAGNOSIS — I152 Hypertension secondary to endocrine disorders: Secondary | ICD-10-CM | POA: Diagnosis not present

## 2022-04-13 DIAGNOSIS — Z780 Asymptomatic menopausal state: Secondary | ICD-10-CM | POA: Diagnosis not present

## 2022-04-13 DIAGNOSIS — I482 Chronic atrial fibrillation, unspecified: Secondary | ICD-10-CM | POA: Diagnosis not present

## 2022-04-13 DIAGNOSIS — E785 Hyperlipidemia, unspecified: Secondary | ICD-10-CM

## 2022-04-13 DIAGNOSIS — Z9889 Other specified postprocedural states: Secondary | ICD-10-CM

## 2022-04-13 DIAGNOSIS — Z0000 Encounter for general adult medical examination without abnormal findings: Secondary | ICD-10-CM | POA: Diagnosis not present

## 2022-04-13 DIAGNOSIS — M8580 Other specified disorders of bone density and structure, unspecified site: Secondary | ICD-10-CM | POA: Diagnosis not present

## 2022-04-13 DIAGNOSIS — E1159 Type 2 diabetes mellitus with other circulatory complications: Secondary | ICD-10-CM | POA: Diagnosis not present

## 2022-04-13 NOTE — Assessment & Plan Note (Signed)
Well controlled Continue current medications Reviewed recent metabolic panel F/u in 6 months  

## 2022-04-13 NOTE — Assessment & Plan Note (Signed)
New diagnosis after last A1c 7.2 ?Complicated by HTN and HLD ?Not on meds ?Recheck A1c ?Discussed recommendations for vaccines, eye exam, foot exam ?Not on ACEi/ARB or Statin ?uACR today ?Discussed diet and exercise ?F/u in 6 months  ?

## 2022-04-13 NOTE — Progress Notes (Signed)
? ?  Subjective:  ?Patient presents today status post distal Symes amputation of the left great toe. DOS: 01/28/2022.  Overall the patient is doing well.  She has been applying the antibiotic ointment and a light dressing to the area daily.  She presents for follow-up treatment and evaluation ? ?Past Medical History:  ?Diagnosis Date  ? (HFpEF) heart failure with preserved ejection fraction ([place])   ? a. 12/2018 Echo: EF 60-65%, no rwma, mild MR. Mildly dil LA. Nl RV fxn. PASP 73mHg.  ? Adverse effect of anesthesia 11/18/2015  ? Arthritis   ? Coccyalgia 01/27/2018  ? Complication of anesthesia   ? trouble waking up with Propofol  ? Depression   ? h/o  ? Family history of adverse reaction to anesthesia   ? daughter PONV  ? Glaucoma   ? left eye  ? Headache   ? H/O MIGRAINES  ? Hemorrhoids   ? Hiatal hernia   ? History of kidney stones   ? History of migraine headaches   ? History of stress test   ? a. 10/2014 MV: No ischemia.  ? Hx MRSA infection   ? Hypercholesteremia   ? Motion sickness   ? boats  ? Neuropathy   ? feet, fingers  ? Osteopenia   ? Permanent atrial fibrillation ([place] 10/01/2014  ? a. CHA2DS2VASc = 3-->eliquis.  ? PONV (postoperative nausea and vomiting)   ? Wears dentures   ? full upper, partial lower  ? Wears hearing aid in both ears   ? ? ? ? ?Objective/Physical Exam ?Neurovascular status intact.  There is a very dry stable superficial scab/eschar to the ulcer site.  This was left intact today.  Overall there is significant improvement.  Negative for any significant erythema or edema around the area. ? ?Assessment: ?1. s/p distal Symes amputation left great toe. DOS: 01/28/2022 ? ? ?Plan of Care:  ?1. Patient was evaluated.   ?2.  Continue antibiotic gentamicin cream with a light dressing or Band-Aid ?3.  Continue postsurgical shoe or wide fitting sneakers/tennis shoe ?4.  Return to clinic 3 weeks ? ?BEdrick Kins DPM ?[place]? ?Dr. BEdrick Kins DPM  ?  ?[address]                                     ?[place] [address]               ?Office ([phone] ?Fax [phone]? ? ? ? ? ?

## 2022-04-13 NOTE — Assessment & Plan Note (Signed)
Chronic and stable  ?In NSR today ?F/b Cardiology ?Reviewed recent CBC ?

## 2022-04-13 NOTE — Patient Instructions (Signed)
Consider asking at the pharmacy about the tetanus shot (TDAP) and the shingles shot (Shingrix)  ? ? ?

## 2022-04-13 NOTE — Assessment & Plan Note (Signed)
Reviewed last lipid panel Not currently on a statin Recheck FLP and CMP Discussed diet and exercise  

## 2022-04-13 NOTE — Assessment & Plan Note (Signed)
Due to Afib 

## 2022-04-13 NOTE — Assessment & Plan Note (Signed)
Recheck DEXA 

## 2022-04-14 LAB — HEPATIC FUNCTION PANEL
ALT: 20 IU/L (ref 0–32)
AST: 25 IU/L (ref 0–40)
Albumin: 4.2 g/dL (ref 3.7–4.7)
Alkaline Phosphatase: 50 IU/L (ref 44–121)
Bilirubin Total: 0.5 mg/dL (ref 0.0–1.2)
Bilirubin, Direct: 0.2 mg/dL (ref 0.00–0.40)
Total Protein: 7.1 g/dL (ref 6.0–8.5)

## 2022-04-14 LAB — MICROALBUMIN / CREATININE URINE RATIO
Creatinine, Urine: 17.6 mg/dL
Microalb/Creat Ratio: 35 mg/g creat — ABNORMAL HIGH (ref 0–29)
Microalbumin, Urine: 6.1 ug/mL

## 2022-04-14 LAB — LIPID PANEL
Chol/HDL Ratio: 3.3 ratio (ref 0.0–4.4)
Cholesterol, Total: 193 mg/dL (ref 100–199)
HDL: 59 mg/dL (ref >39)
LDL Chol Calc (NIH): 111 mg/dL — ABNORMAL HIGH (ref 0–99)
Triglycerides: 130 mg/dL (ref 0–149)
VLDL Cholesterol Cal: 23 mg/dL (ref 5–40)

## 2022-04-14 LAB — HEMOGLOBIN A1C
Est. average glucose Bld gHb Est-mCnc: 126 mg/dL
Hgb A1c MFr Bld: 6 % — ABNORMAL HIGH (ref 4.8–5.6)

## 2022-04-15 ENCOUNTER — Telehealth: Payer: Self-pay

## 2022-04-15 DIAGNOSIS — R809 Proteinuria, unspecified: Secondary | ICD-10-CM

## 2022-04-15 MED ORDER — LOSARTAN POTASSIUM 25 MG PO TABS: 12.5000 mg | ORAL_TABLET | Freq: Every day | ORAL | 0 refills | Status: DC

## 2022-04-15 NOTE — Telephone Encounter (Signed)
-----   Message from Virginia Crews, MD sent at 04/15/2022  8:25 AM EDT ----- ?Normal/stable labs. Good improvement in A1c.  Well controlled (goal <7), so no meds needed for diabetes.  There is a slightly elevated amount of protein in the urine, so we typically recommend low dose losartan (12.5 mg daily - ok to eRx if patient agrees) for kidney protection. ?

## 2022-04-15 NOTE — Telephone Encounter (Signed)
Patient aware.

## 2022-04-30 DIAGNOSIS — H16223 Keratoconjunctivitis sicca, not specified as Sjogren's, bilateral: Secondary | ICD-10-CM | POA: Diagnosis not present

## 2022-05-04 ENCOUNTER — Other Ambulatory Visit: Payer: Self-pay | Admitting: Family Medicine

## 2022-05-04 DIAGNOSIS — N951 Menopausal and female climacteric states: Secondary | ICD-10-CM

## 2022-05-07 ENCOUNTER — Ambulatory Visit (INDEPENDENT_AMBULATORY_CARE_PROVIDER_SITE_OTHER): Payer: PPO | Admitting: Podiatry

## 2022-05-07 DIAGNOSIS — Z9889 Other specified postprocedural states: Secondary | ICD-10-CM

## 2022-05-17 ENCOUNTER — Other Ambulatory Visit: Payer: Self-pay | Admitting: Family Medicine

## 2022-05-17 DIAGNOSIS — M5136 Other intervertebral disc degeneration, lumbar region: Secondary | ICD-10-CM | POA: Diagnosis not present

## 2022-05-17 DIAGNOSIS — G2581 Restless legs syndrome: Secondary | ICD-10-CM

## 2022-05-17 DIAGNOSIS — G629 Polyneuropathy, unspecified: Secondary | ICD-10-CM

## 2022-05-17 DIAGNOSIS — M5416 Radiculopathy, lumbar region: Secondary | ICD-10-CM | POA: Diagnosis not present

## 2022-05-17 DIAGNOSIS — M62830 Muscle spasm of back: Secondary | ICD-10-CM | POA: Diagnosis not present

## 2022-05-18 NOTE — Progress Notes (Signed)
   Subjective:  Patient presents today status post distal Symes amputation of the left great toe. DOS: 01/28/2022.  Patient continues to improve.  She says that she believes the wound has healed.  She presents for further treatment and evaluation.  No new complaints at this time  Past Medical History:  Diagnosis Date   (HFpEF) heart failure with preserved ejection fraction ([place])    a. 12/2018 Echo: EF 60-65%, no rwma, mild MR. Mildly dil LA. Nl RV fxn. PASP 31mHg.   Adverse effect of anesthesia 11/18/2015   Arthritis    Coccyalgia 26/12/930  Complication of anesthesia    trouble waking up with Propofol   Depression    h/o   Family history of adverse reaction to anesthesia    daughter PONV   Glaucoma    left eye   Headache    H/O MIGRAINES   Hemorrhoids    Hiatal hernia    History of kidney stones    History of migraine headaches    History of stress test    a. 10/2014 MV: No ischemia.   Hx MRSA infection    Hypercholesteremia    Motion sickness    boats   Neuropathy    feet, fingers   Osteopenia    Permanent atrial fibrillation ([place] 10/01/2014   a. CHA2DS2VASc = 3-->eliquis.   PONV (postoperative nausea and vomiting)    Wears dentures    full upper, partial lower   Wears hearing aid in both ears     Objective/Physical Exam Neurovascular status intact.  The ulcer to the medial aspect of the left great toe has healed completely.  Reepithelialization of the entire wound is noted.  There is no open wound today.  Negative for any significant erythema or edema around the area.  Assessment: 1. s/p distal Symes amputation left great toe. DOS: 01/28/2022 2.  Ulcer medial aspect of the left great toe  Plan of Care:  1. Patient was evaluated.   2.  Today it appears that the wound to the left great toe has healed completely.  Complete reepithelialization has occurred.  Light debridement of the area was performed today using a tissue nipper which demonstrated healthy underlying  skin 3.  Recommend good daily foot moisturizer to keep the area moist and hydrated 4.  Patient may slowly increase back to full activity no restrictions 5.  Return to clinic as needed  BEdrick Kins DPM [facility]  Dr. BEdrick Kins DPM    [address] [place] [address]               Office [phone] Fax [phone]

## 2022-05-31 ENCOUNTER — Other Ambulatory Visit: Payer: Self-pay | Admitting: Family Medicine

## 2022-06-10 ENCOUNTER — Ambulatory Visit
Admission: RE | Admit: 2022-06-10 | Discharge: 2022-06-10 | Disposition: A | Discharge status: Home / Self Care | Payer: PPO | Source: Ambulatory Visit | Attending: Family Medicine | Admitting: Family Medicine

## 2022-06-10 DIAGNOSIS — M8580 Other specified disorders of bone density and structure, unspecified site: Secondary | ICD-10-CM | POA: Diagnosis not present

## 2022-06-10 DIAGNOSIS — M8589 Other specified disorders of bone density and structure, multiple sites: Secondary | ICD-10-CM | POA: Diagnosis not present

## 2022-06-10 DIAGNOSIS — Z780 Asymptomatic menopausal state: Secondary | ICD-10-CM | POA: Diagnosis not present

## 2022-06-10 NOTE — Progress Notes (Signed)
Osteopenia noted in all measured bones; risk of fracture calculated >27% in 10 years. Continue to recommend Vit D, 2000 IU and Calcium, 1200 mg supplementation as well as weight bearing exercise like walking.  Gwyneth Sprout, [address] [place], [place] [address] [phone] (phone) [phone] (fax) [place]

## 2022-06-14 ENCOUNTER — Other Ambulatory Visit: Payer: Self-pay | Admitting: Cardiovascular Disease

## 2022-06-14 DIAGNOSIS — M48062 Spinal stenosis, lumbar region with neurogenic claudication: Secondary | ICD-10-CM | POA: Diagnosis not present

## 2022-06-14 DIAGNOSIS — M5136 Other intervertebral disc degeneration, lumbar region: Secondary | ICD-10-CM | POA: Diagnosis not present

## 2022-06-14 DIAGNOSIS — M62830 Muscle spasm of back: Secondary | ICD-10-CM | POA: Diagnosis not present

## 2022-06-14 DIAGNOSIS — M5416 Radiculopathy, lumbar region: Secondary | ICD-10-CM | POA: Diagnosis not present

## 2022-06-14 NOTE — Telephone Encounter (Signed)
Please schedule 6 month F/U appt for 90 day refills. Thank you! 

## 2022-06-18 DIAGNOSIS — H401133 Primary open-angle glaucoma, bilateral, severe stage: Secondary | ICD-10-CM | POA: Diagnosis not present

## 2022-06-23 DIAGNOSIS — H902 Conductive hearing loss, unspecified: Secondary | ICD-10-CM | POA: Diagnosis not present

## 2022-06-23 DIAGNOSIS — H6123 Impacted cerumen, bilateral: Secondary | ICD-10-CM | POA: Diagnosis not present

## 2022-06-24 ENCOUNTER — Other Ambulatory Visit: Payer: Self-pay | Admitting: Cardiovascular Disease

## 2022-06-24 DIAGNOSIS — I482 Chronic atrial fibrillation, unspecified: Secondary | ICD-10-CM

## 2022-06-24 NOTE — Telephone Encounter (Signed)
Rx refill sent to pharmacy. 

## 2022-07-05 DIAGNOSIS — M48062 Spinal stenosis, lumbar region with neurogenic claudication: Secondary | ICD-10-CM | POA: Diagnosis not present

## 2022-07-05 DIAGNOSIS — M5416 Radiculopathy, lumbar region: Secondary | ICD-10-CM | POA: Diagnosis not present

## 2022-07-06 ENCOUNTER — Ambulatory Visit: Payer: Self-pay

## 2022-07-06 NOTE — Telephone Encounter (Signed)
Answer Assessment - Initial Assessment Questions 1. NAME of MEDICINE: "What medicine(s) are you calling about?"     Losartan  2. QUESTION: "What is your question?" (e.g., double dose of medicine, side effect)     Pt stats she has side effects and will no longer take this medication 3. PRESCRIBER: "Who prescribed the medicine?" Reason: if prescribed by specialist, call should be referred to that group.     Dr. Brita Romp 4. SYMPTOMS: "Do you have any symptoms?" If Yes, ask: "What symptoms are you having?"  "How bad are the symptoms (e.g., mild, moderate, severe)     Has shakiness, is very tired and sleepy 5. PREGNANCY:  "Is there any chance that you are pregnant?" "When was your last menstrual period?"     na  Protocols used: Medication Question Call-A-AH

## 2022-07-06 NOTE — Telephone Encounter (Signed)
  Chief Complaint: Side effects from medication Symptoms: Sleepy, shakiness, "knocks her out" Frequency: since starting losartan Pertinent Negatives: Patient denies  Disposition: '[]'$ ED /'[]'$ Urgent Care (no appt availability in office) / '[]'$ Appointment(In office/virtual)/ '[]'$  [place]/ '[]'$ Home Care/ '[x]'$ Refused Recommended Disposition /'[]'$ [facility]/ '[]'$  Follow-up with PCP Additional Notes: Pt called and stated that she will no longer be taking Losartan. It causes her unpleasant side effects. She feels shaky, her whole body shakes, She is sleepy and difficult to rouse. Yesterday her BP at a provider's office was 106/63. Pt is unsure why she ever put on this medication. Per pt it was for potassium. Pt states she does not need potassium, her other doctor told her she had too much potassium after she had been on a supplement.  PT refuses triage, but will monitor s/s, if they do not resolve after stopping this medication or they become worse she will call back.  PT is also interested in a home BP cuff. Is it possible to prescribe one for her?

## 2022-07-07 NOTE — Telephone Encounter (Signed)
LMTCB-Ok for [facility] nurse to give patient providers message.

## 2022-07-08 NOTE — Telephone Encounter (Signed)
Lmtcb. PEC please advise as below. 

## 2022-07-08 NOTE — Telephone Encounter (Signed)
Pt returned call about Losartan. I gave her comments from Mikey Kirschner, PA, Pt is very upset. She sees a cardiologist and wants him to order her meds she needs that relate to the heart. She has an appt with him 08/06/22. She does not want to come into office for appt, bp check or lab work. She states she will buy a cuff bp machine for home and keep check on it. Pt also states she has not taken Losartan in a couple of days and she already feels better.

## 2022-07-13 ENCOUNTER — Other Ambulatory Visit: Payer: Self-pay | Admitting: *Deleted

## 2022-07-13 DIAGNOSIS — I482 Chronic atrial fibrillation, unspecified: Secondary | ICD-10-CM

## 2022-07-13 MED ORDER — APIXABAN 5 MG PO TABS: 5.0000 mg | ORAL_TABLET | Freq: Two times a day (BID) | ORAL | 1 refills | Status: DC

## 2022-07-13 NOTE — Telephone Encounter (Signed)
Eliquis '5mg'$  refill request received. Patient is 81 years old, weight-87.9kg, Crea-0.87 on 2/7/20223, Diagnosis-Afib, and last seen by Ignacia Bayley on 01/12/2022. Dose is appropriate based on dosing criteria. Will send in refill to requested pharmacy.

## 2022-07-13 NOTE — Telephone Encounter (Signed)
Scheduled

## 2022-07-26 DIAGNOSIS — M5416 Radiculopathy, lumbar region: Secondary | ICD-10-CM | POA: Diagnosis not present

## 2022-07-26 DIAGNOSIS — M5136 Other intervertebral disc degeneration, lumbar region: Secondary | ICD-10-CM | POA: Diagnosis not present

## 2022-07-26 DIAGNOSIS — M48062 Spinal stenosis, lumbar region with neurogenic claudication: Secondary | ICD-10-CM | POA: Diagnosis not present

## 2022-08-02 ENCOUNTER — Other Ambulatory Visit: Payer: Self-pay | Admitting: Family Medicine

## 2022-08-02 DIAGNOSIS — N951 Menopausal and female climacteric states: Secondary | ICD-10-CM

## 2022-08-06 ENCOUNTER — Ambulatory Visit: Payer: PPO | Attending: Nurse Practitioner | Admitting: Nurse Practitioner

## 2022-08-06 ENCOUNTER — Encounter: Payer: Self-pay | Admitting: Nurse Practitioner

## 2022-08-06 VITALS — BP 132/80 | HR 72 | Ht 67.0 in | Wt 197.8 lb

## 2022-08-06 DIAGNOSIS — I5032 Chronic diastolic (congestive) heart failure: Secondary | ICD-10-CM | POA: Diagnosis not present

## 2022-08-06 DIAGNOSIS — E782 Mixed hyperlipidemia: Secondary | ICD-10-CM | POA: Diagnosis not present

## 2022-08-06 DIAGNOSIS — I4821 Permanent atrial fibrillation: Secondary | ICD-10-CM | POA: Diagnosis not present

## 2022-08-06 NOTE — Patient Instructions (Signed)

## 2022-08-06 NOTE — Progress Notes (Signed)
Office Visit    Patient Name: Teresa Harris Date of Encounter: 08/06/2022  Primary Care Provider:  Virginia Crews, MD Primary Cardiologist:  Kathlyn Sacramento, MD  Chief Complaint    81 year old female with a history of permanent atrial fibrillation, borderline diabetes, hyperlipidemia, and diastolic heart failure, who presents for follow-up of atrial fibrillation.  Past Medical History    Past Medical History:  Diagnosis Date   (HFpEF) heart failure with preserved ejection fraction ([place])    a. 12/2018 Echo: EF 60-65%, no rwma, mild MR. Mildly dil LA. Nl RV fxn. PASP 10mHg.   Adverse effect of anesthesia 11/18/2015   Arthritis    Coccyalgia 25/40/0867  Complication of anesthesia    trouble waking up with Propofol   Depression    h/o   Family history of adverse reaction to anesthesia    daughter PONV   Glaucoma    left eye   Headache    H/O MIGRAINES   Hemorrhoids    Hiatal hernia    History of kidney stones    History of migraine headaches    History of stress test    a. 10/2014 MV: No ischemia.   Hx MRSA infection    Hypercholesteremia    Motion sickness    boats   Neuropathy    feet, fingers   Osteopenia    Permanent atrial fibrillation ([place] 10/01/2014   a. CHA2DS2VASc = 3-->eliquis.   PONV (postoperative nausea and vomiting)    Wears dentures    full upper, partial lower   Wears hearing aid in both ears    Past Surgical History:  Procedure Laterality Date   ABDOMINAL HYSTERECTOMY     AUGMENTATION MAMMAPLASTY Bilateral 2004   BROW LIFT Bilateral 12/19/2020   Procedure: BLEPHAROPLASTY UPPER EYELID; W/EXCESS SKIN BLEPHAROPTOSIS REPAIR; RESECT EX BILATERAL;  Surgeon: FKarle Starch MD;  [place]  Service: Ophthalmology;  Laterality: Bilateral;   CARPAL TUNNEL RELEASE Right 02/16/2016   Procedure: CARPAL TUNNEL RELEASE;  Surgeon: JDereck Leep MD;  Location: [facility];  Service: Orthopedics;  Laterality: Right;   CARPAL  TUNNEL RELEASE Left 04/12/2016   Procedure: CARPAL TUNNEL RELEASE;  Surgeon: JDereck Leep MD;  Location: [facility];  Service: Orthopedics;  Laterality: Left;   CARPAL TUNNEL RELEASE Right 03/13/2018   Procedure: CARPAL TUNNEL RELEASE;  Surgeon: CDeetta Perla MD;  Location: [facility];  Service: Neurosurgery;  Laterality: Right;   CATARACT EXTRACTION, BILATERAL     COLONOSCOPY  12/29/2009   Dr IDionne Milo  EYE SURGERY Left    shunt placed   EYE SURGERY Left    tube placed   FOOT SURGERY     bilateral    JOINT REPLACEMENT     NASAL SEPTUM SURGERY     REPLACEMENT TOTAL KNEE     bilateral   SHOULDER SURGERY     right shoulder   TOE SURGERY Left    2023    Allergies  Allergies  Allergen Reactions   [name] [Moxifloxacin Hcl In Nacl] Other (See Comments)    Headache   Bimatoprost Other (See Comments)    Made my eyes red, and hurt   Brimonidine Other (See Comments)    Made my eyes red, and hurt Burned eyes.   Clindamycin/Lincomycin Diarrhea    Severe diarrhea   Eszopiclone    Mobic [Meloxicam] Other (See Comments)    headache   Propofol Other (See Comments)    Had difficulty waking up  Difficult to wake up.   Travoprost Other (See Comments)    Unknown   Vicodin [Hydrocodone-Acetaminophen] Nausea And Vomiting and Other (See Comments)    Sick on stomach   Cephalexin Nausea And Vomiting and Other (See Comments)    headache    History of Present Illness    81 year old female with above past medical history including permanent atrial fibrillation, borderline diabetes, hyperlipidemia, and diastolic heart failure.  She previously smoked but quit more than 30 years ago.  She underwent stress testing in November 2015, which was nonischemic.  Most recent echo in January 2020, showed normal LV function, mild MR, mildly dilated left atrium, normal RV function, and a PASP of 35 mmHg.  Teresa Harris was last seen in cardiology clinic in February 2023, at which time she was doing well from  a cardiac standpoint.  She reported a left great toe ulceration at that time and was being seen by podiatry.  MRI was concerning for osteomyelitis and she ended up requiring amputation of the distal left great toe.  She notes that postoperative course was complicated by an infection requiring localized treatment.  Since then, she has not been able to walk quite as well secondary to an unsteadiness.  In this setting, she has been more sedentary and her weight has continued to climb.  She is up 4 pounds since May and notes that she is the heaviest that she is ever been in her lifetime.  She does not experience chest pain or dyspnea and denies palpitations, PND, orthopnea, dizziness, syncope, or early satiety.  She has chronic, mild lower extremity swelling, which she manages with compression socks and low-dose Lasix.  Home Medications    Current Outpatient Medications  Medication Sig Dispense Refill   apixaban (ELIQUIS) 5 MG TABS tablet Take 1 tablet (5 mg total) by mouth 2 (two) times daily. 180 tablet 1   Calcium Carbonate-Vitamin D (CALCIUM-VITAMIN D3 PO) Take 1 tablet by mouth daily. Calcium 600 = D3 1000     celecoxib (CELEBREX) 200 MG capsule Take 200 mg by mouth daily.     Cholecalciferol (VITAMIN D3) 50 MCG (2000 UT) capsule Take by mouth in the morning and at bedtime.     cycloSPORINE (RESTASIS) 0.05 % ophthalmic emulsion Place 1 drop into both eyes 2 (two) times daily.      dorzolamide-timolol (COSOPT) 22.3-6.8 MG/ML ophthalmic solution Place 1 drop into both eyes 2 (two) times daily.      fenofibrate (TRICOR) 145 MG tablet TAKE 1 TABLET BY MOUTH ONCE DAILY 90 tablet 3   furosemide (LASIX) 20 MG tablet TAKE 1 TABLET BY MOUTH ONCE DAILY 90 tablet 0   latanoprost (XALATAN) 0.005 % ophthalmic solution Place 1 drop into both eyes daily.      LYRICA 100 MG capsule TAKE 1 CAPSULE BY MOUTH ONCE DAILY 90 capsule 1   Magnesium 400 MG CAPS Take 400 mg by mouth daily.      metoprolol succinate  (TOPROL-XL) 25 MG 24 hr tablet TAKE 1/2 TABLET BY MOUTH ONCE DAILY 45 tablet 0   METRONIDAZOLE, TOPICAL, 0.75 % LOTN Apply topically daily. Apply to face in AM     Multiple Vitamin (MULTIVITAMIN) tablet Take 1 tablet by mouth daily.     NON FORMULARY Place 12 sprays into both nostrils 6 (six) times daily. 250GU irrigant     Omega-3 Fatty Acids (FISH OIL) 1200 MG CAPS Take by mouth 2 (two) times daily.     omeprazole (PRILOSEC) 40 MG  capsule Take 40 mg by mouth every morning.      PREMARIN 0.625 MG tablet TAKE 1 TABLET BY MOUTH ONCE DAILY 90 tablet 0   SOOLANTRA 1 % CREA Apply 1 application topically daily. Sometimes uses twice daily as needed for flare ups.     losartan (COZAAR) 25 MG tablet Take 0.5 tablets (12.5 mg total) by mouth daily. (Patient not taking: Reported on 08/06/2022) 90 tablet 0   mupirocin ointment (BACTROBAN) 2 % Apply 1 application topically 2 (two) times daily. (Patient not taking: Reported on 08/06/2022) 22 g 0   No current facility-administered medications for this visit.     Review of Systems    Mild chronic lower extremity swelling which is managed with low-dose Lasix and compression socks.  Unsteady gait since surgery to her left great toe earlier this year.  She denies chest pain, dyspnea, palpitations, PND, orthopnea, dizziness, syncope, or early satiety.  All other systems reviewed and are otherwise negative except as noted above.    Physical Exam    VS:  BP 132/80 (BP Location: Left Arm, Patient Position: Sitting, Cuff Size: Normal)   Pulse 72   Ht '5\' 7"'$  (1.702 m)   Wt 197 lb 12.8 oz (89.7 kg)   SpO2 96%   BMI 30.98 kg/m  , BMI Body mass index is 30.98 kg/m.     GEN: Well nourished, well developed, in no acute distress. HEENT: normal. Neck: Supple, no JVD, carotid bruits, or masses. Cardiac: Irregularly irregular, no murmurs, rubs, or gallops. No clubbing, cyanosis, edema.  Radials/PT 2+ and equal bilaterally.  Respiratory:  Respirations regular and  unlabored, clear to auscultation bilaterally. GI: Soft, nontender, nondistended, BS + x 4. MS: no deformity or atrophy. Skin: warm and dry, no rash. Neuro:  Strength and sensation are intact. Psych: Normal affect.  Accessory Clinical Findings    ECG personally reviewed by me today -atrial fibrillation, 72, PVC- no acute changes.  Lab Results  Component Value Date   WBC 5.3 01/12/2022   HGB 15.6 01/12/2022   HCT 45.8 01/12/2022   MCV 94 01/12/2022   PLT 162 01/12/2022   Lab Results  Component Value Date   CREATININE 0.87 01/12/2022   BUN 20 01/12/2022   NA 140 01/12/2022   K 4.5 01/12/2022   CL 101 01/12/2022   CO2 25 01/12/2022   Lab Results  Component Value Date   ALT 20 04/13/2022   AST 25 04/13/2022   ALKPHOS 50 04/13/2022   BILITOT 0.5 04/13/2022   Lab Results  Component Value Date   CHOL 193 04/13/2022   HDL 59 04/13/2022   LDLCALC 111 (H) 04/13/2022   TRIG 130 04/13/2022   CHOLHDL 3.3 04/13/2022    Lab Results  Component Value Date   HGBA1C 6.0 (H) 04/13/2022    Assessment & Plan    1.  Permanent atrial fibrillation: Rate is well controlled on beta-blocker therapy.  She is anticoagulated.  She is anticoagulated with Eliquis with stable labs earlier this year.  2.  Chronic HFpEF: EF 60 to 65% by echo in January 2020.  PASP was 35 mmHg at that time.  Heart rate and blood pressure well controlled.  She has trace lower extremity swelling and continues to use Lasix 20 mg daily and wears compression socks.  3.  Hyperlipidemia/hypertriglyceridemia: On fenofibrate therapy with triglycerides of 130, down from 249 last year.  LDL was up a little bit further to 111 in May.  We discussed this and she  notes weight gain over the past 5 years, since retiring.  She is going to start reducing calories in her diet and we discussed options for exercise in the setting of unsteady gait.  She would greatly benefit from weight loss.  4.  Disposition: Follow-up in clinic in 6  months or sooner if necessary.   Murray Hodgkins, NP 08/06/2022, 2:44 PM

## 2022-08-14 ENCOUNTER — Other Ambulatory Visit: Payer: Self-pay | Admitting: Family Medicine

## 2022-08-14 DIAGNOSIS — G2581 Restless legs syndrome: Secondary | ICD-10-CM

## 2022-08-14 DIAGNOSIS — G629 Polyneuropathy, unspecified: Secondary | ICD-10-CM

## 2022-08-16 ENCOUNTER — Other Ambulatory Visit: Payer: Self-pay

## 2022-08-16 DIAGNOSIS — G2581 Restless legs syndrome: Secondary | ICD-10-CM

## 2022-08-16 DIAGNOSIS — G629 Polyneuropathy, unspecified: Secondary | ICD-10-CM

## 2022-08-16 MED ORDER — PREGABALIN 100 MG PO CAPS: 100.0000 mg | ORAL_CAPSULE | Freq: Every day | ORAL | 0 refills | Status: DC

## 2022-09-13 ENCOUNTER — Other Ambulatory Visit: Payer: Self-pay | Admitting: Cardiovascular Disease

## 2022-09-13 NOTE — Telephone Encounter (Signed)
Rx refill sent to pharmacy. 

## 2022-09-14 ENCOUNTER — Ambulatory Visit: Payer: PPO | Admitting: Podiatry

## 2022-09-14 DIAGNOSIS — B351 Tinea unguium: Secondary | ICD-10-CM | POA: Diagnosis not present

## 2022-09-14 DIAGNOSIS — M79676 Pain in unspecified toe(s): Secondary | ICD-10-CM

## 2022-09-14 NOTE — Progress Notes (Signed)
No chief complaint on file.   SUBJECTIVE Patient presents to office today complaining of elongated, thickened nails that cause pain while ambulating in shoes.  Patient is unable to trim their own nails. Patient is here for further evaluation and treatment.  Past Medical History:  Diagnosis Date   (HFpEF) heart failure with preserved ejection fraction ([place])    a. 12/2018 Echo: EF 60-65%, no rwma, mild MR. Mildly dil LA. Nl RV fxn. PASP 59mHg.   Adverse effect of anesthesia 11/18/2015   Arthritis    Coccyalgia 21/85/6314  Complication of anesthesia    trouble waking up with Propofol   Depression    h/o   Family history of adverse reaction to anesthesia    daughter PONV   Glaucoma    left eye   Headache    H/O MIGRAINES   Hemorrhoids    Hiatal hernia    History of kidney stones    History of migraine headaches    History of stress test    a. 10/2014 MV: No ischemia.   Hx MRSA infection    Hypercholesteremia    Motion sickness    boats   Neuropathy    feet, fingers   Osteopenia    [place]ermanent [place] 10/[place]   a. CHA2DS2VASc = 3-->eliquis.   PONV (postoperative nausea and vomiting)    Wears dentures    full upper, partial lower   Wears hearing aid in both ears     Allergies  Allergen Reactions   [name] [Moxifloxacin Hcl In Nacl] Other (See Comments)    Headache   Bimatoprost Other (See Comments)    Made my eyes red, and hurt   Brimonidine Other (See Comments)    Made my eyes red, and hurt Burned eyes.   Clindamycin/Lincomycin Diarrhea    Severe diarrhea   Eszopiclone    Mobic [Meloxicam] Other (See Comments)    headache   Propofol Other (See Comments)    Had difficulty waking up Difficult to wake up.   Travoprost Other (See Comments)    Unknown   Vicodin [Hydrocodone-Acetaminophen] Nausea And Vomiting and Other (See Comments)    Sick on stomach   Cephalexin Nausea And Vomiting and Other (See Comments)    headache      OBJECTIVE General Patient is awake, alert, and oriented x 3 and in no acute distress. Derm Skin is dry and supple bilateral.  There are some calluses noted to the weightbearing surfaces of the bilateral feet and toes.  Nails are tender, long, thickened and dystrophic with subungual debris, consistent with onychomycosis, 1-5 bilateral. No signs of infection noted. Vasc  DP and PT pedal pulses palpable bilaterally. Temperature gradient within normal limits.  Neuro Epicritic and protective threshold sensation grossly intact bilaterally.  Musculoskeletal Exam No symptomatic pedal deformities noted bilateral. Muscular strength within normal limits.  ASSESSMENT 1.  Pain due to onychomycosis of toenails both 2.  History of distal Sym[place] amputation left great toe  PLAN OF CARE 1. Patient evaluated today.  Excisional debridement of the hyperkeratotic callus tissue was performed today using a 312 scalpel without incident or bleeding 2. Instructed to maintain good pedal hygiene and foot care.  3. Mechanical debridement of nails 1-5 bilaterally performed using a nail nipper. Filed with dremel without incident.  4. Return to clinic in 3 mos.    BEdrick Kins DPM [facility]  Dr. BEdrick Kins DPM    [address]  [place], [place] [address]                Office [phone]  Fax [phone]

## 2022-09-22 ENCOUNTER — Ambulatory Visit (INDEPENDENT_AMBULATORY_CARE_PROVIDER_SITE_OTHER): Payer: PPO

## 2022-09-22 DIAGNOSIS — Z23 Encounter for immunization: Secondary | ICD-10-CM | POA: Diagnosis not present

## 2022-09-25 ENCOUNTER — Other Ambulatory Visit: Payer: Self-pay | Admitting: Cardiovascular Disease

## 2022-09-25 DIAGNOSIS — I482 Chronic atrial fibrillation, unspecified: Secondary | ICD-10-CM

## 2022-10-05 DIAGNOSIS — K625 Hemorrhage of anus and rectum: Secondary | ICD-10-CM | POA: Diagnosis not present

## 2022-10-11 ENCOUNTER — Other Ambulatory Visit: Payer: Self-pay | Admitting: Nurse Practitioner

## 2022-10-12 DIAGNOSIS — Z86018 Personal history of other benign neoplasm: Secondary | ICD-10-CM | POA: Diagnosis not present

## 2022-10-12 DIAGNOSIS — L578 Other skin changes due to chronic exposure to nonionizing radiation: Secondary | ICD-10-CM | POA: Diagnosis not present

## 2022-10-12 DIAGNOSIS — Z872 Personal history of diseases of the skin and subcutaneous tissue: Secondary | ICD-10-CM | POA: Diagnosis not present

## 2022-10-12 DIAGNOSIS — L718 Other rosacea: Secondary | ICD-10-CM | POA: Diagnosis not present

## 2022-10-18 ENCOUNTER — Ambulatory Visit: Payer: PPO | Admitting: Family Medicine

## 2022-10-19 DIAGNOSIS — K648 Other hemorrhoids: Secondary | ICD-10-CM | POA: Diagnosis not present

## 2022-11-02 ENCOUNTER — Other Ambulatory Visit: Payer: Self-pay | Admitting: Family Medicine

## 2022-11-02 DIAGNOSIS — N951 Menopausal and female climacteric states: Secondary | ICD-10-CM

## 2022-11-03 NOTE — Telephone Encounter (Signed)
Requested Prescriptions  Pending Prescriptions Disp Refills   PREMARIN 0.625 MG tablet [Pharmacy Med Name: PREMARIN 0.625 MG TAB] 90 tablet 0    Sig: TAKE 1 TABLET BY MOUTH ONCE DAILY     OB/GYN:  Estrogens Failed - 11/02/2022  3:20 PM      Failed - Mammogram is up-to-date per Health Maintenance      Passed - Last BP in normal range    BP Readings from Last 1 Encounters:  08/06/22 132/80         Passed - Valid encounter within last 12 months    Recent Outpatient Visits           6 months ago Encounter for annual wellness visit (AWV) in Medicare patient   [employer], Dionne Bucy, MD   11 months ago Upper respiratory tract infection, unspecified type   [employer], Dani Gobble, PA-C   1 year ago Encounter for subsequent annual wellness visit (AWV) in Medicare patient   [employer], Dionne Bucy, MD   2 years ago Annual physical exam   [place], Clearnce Sorrel, [place]   2 years ago Adverse effect of vaccine, initial encounter   [employer], Clearnce Sorrel, PA-C       Future Appointments             In 3 months Fletcher Anon, Mertie Clause, MD [place]. [place]

## 2022-11-09 DIAGNOSIS — K648 Other hemorrhoids: Secondary | ICD-10-CM | POA: Diagnosis not present

## 2022-11-10 DIAGNOSIS — K1121 Acute sialoadenitis: Secondary | ICD-10-CM | POA: Diagnosis not present

## 2022-11-17 ENCOUNTER — Other Ambulatory Visit: Payer: Self-pay | Admitting: Family Medicine

## 2022-11-17 DIAGNOSIS — G2581 Restless legs syndrome: Secondary | ICD-10-CM

## 2022-11-17 DIAGNOSIS — G629 Polyneuropathy, unspecified: Secondary | ICD-10-CM

## 2022-11-17 NOTE — Telephone Encounter (Signed)
Requested medication (s) are due for refill today: yes  Requested medication (s) are on the active medication list: yes  Last refill:  01/12/22  Future visit scheduled: no  Notes to clinic:  historical med, please advise for refill     Requested Prescriptions  Pending Prescriptions Disp Refills   celecoxib (CELEBREX) 200 MG capsule [Pharmacy Med Name: CELECOXIB 200 MG CAP] 180 capsule     Sig: TAKE 1 CAPSULE BY MOUTH TWICE DAILY     Analgesics:  COX2 Inhibitors Failed - 11/17/2022 10:26 AM      Failed - Manual Review: Labs are only required if the patient has taken medication for more than 8 weeks.      Passed - HGB in normal range and within 360 days    Hemoglobin  Date Value Ref Range Status  01/12/2022 15.6 11.1 - 15.9 g/dL Final         Passed - Cr in normal range and within 360 days    Creatinine  Date Value Ref Range Status  12/29/2011 0.57 (L) 0.60 - 1.30 mg/dL Final   Creatinine, Ser  Date Value Ref Range Status  01/12/2022 0.87 0.57 - 1.00 mg/dL Final         Passed - HCT in normal range and within 360 days    Hematocrit  Date Value Ref Range Status  01/12/2022 45.8 34.0 - 46.6 % Final         Passed - AST in normal range and within 360 days    AST  Date Value Ref Range Status  04/13/2022 25 0 - 40 IU/L Final         Passed - ALT in normal range and within 360 days    ALT  Date Value Ref Range Status  04/13/2022 20 0 - 32 IU/L Final         Passed - eGFR is 30 or above and within 360 days    EGFR (African American)  Date Value Ref Range Status  12/29/2011 >60 >32m/min Final   GFR calc Af Amer  Date Value Ref Range Status  11/26/2020 66 >59 mL/min/1.73 Final    Comment:    **In accordance with recommendations from the NKF-ASN Task force,**   [facility] is in the process of updating its eGFR calculation to the   2021 CKD-EPI creatinine equation that estimates kidney function   without a race variable.    EGFR (Non-African Amer.)  Date Value  Ref Range Status  12/29/2011 >60 >612mmin Final    Comment:    eGFR values <6062min/1.73 m2 may be an indication of chronic kidney disease (CKD). Calculated eGFR, using the MRDR Study equation, is useful in  patients with stable renal function. The eGFR calculation will not be reliable in acutely ill patients when serum creatinine is changing rapidly. It is not useful in patients on dialysis. The eGFR calculation may not be applicable to patients at the low and high extremes of body sizes, pregnant women, and vegetarians.    GFR calc non Af Amer  Date Value Ref Range Status  11/26/2020 57 (L) >59 mL/min/1.73 Final   eGFR  Date Value Ref Range Status  01/12/2022 67 >59 mL/min/1.73 Final         Passed - Patient is not pregnant      Passed - Valid encounter within last 12 months    Recent Outpatient Visits           7 months ago Encounter for annual wellness  visit (AWV) in Medicare patient   [facility] [place], Dionne Bucy, MD   11 months ago Upper respiratory tract infection, unspecified type   [place], Dani Gobble, PA-C   1 year ago Encounter for subsequent annual wellness visit (AWV) in Medicare patient   [facility] [place], Dionne Bucy, MD   2 years ago Annual physical exam   [facility] [name], [place]   2 years ago Adverse effect of vaccine, initial encounter   [facility], Clearnce Sorrel, PA-C       Future Appointments             In 2 months Fletcher Anon, Mertie Clause, MD [place]. [place]

## 2022-11-17 NOTE — Telephone Encounter (Signed)
Requested medication (s) are due for refill today: yes  Requested medication (s) are on the active medication list: yes  Last refill:  08/16/22 #90/0  Future visit scheduled: no  Notes to clinic:  Unable to refill per protocol, cannot delegate.    Requested Prescriptions  Pending Prescriptions Disp Refills   LYRICA 100 MG capsule [Pharmacy Med Name: LYRICA 100 MG CAP] 90 capsule     Sig: TAKE 1 CAPSULE BY MOUTH ONCE DAILY     Not Delegated - Neurology:  Anticonvulsants - Controlled - pregabalin Failed - 11/17/2022 10:29 AM      Failed - This refill cannot be delegated      Passed - Cr in normal range and within 360 days    Creatinine  Date Value Ref Range Status  12/29/2011 0.57 (L) 0.60 - 1.30 mg/dL Final   Creatinine, Ser  Date Value Ref Range Status  01/12/2022 0.87 0.57 - 1.00 mg/dL Final         Passed - Completed PHQ-2 or PHQ-9 in the last 360 days      Passed - Valid encounter within last 12 months    Recent Outpatient Visits           7 months ago Encounter for annual wellness visit (AWV) in Medicare patient   [employer], Dionne Bucy, MD   11 months ago Upper respiratory tract infection, unspecified type   [employer], Dani Gobble, PA-C   1 year ago Encounter for subsequent annual wellness visit (AWV) in Medicare patient   [employer], Dionne Bucy, MD   2 years ago Annual physical exam   [place], Clearnce Sorrel, [place]   2 years ago Adverse effect of vaccine, initial encounter   [employer], Clearnce Sorrel, PA-C       Future Appointments             In 2 months Fletcher Anon, Mertie Clause, MD [place]. [place]

## 2022-11-18 ENCOUNTER — Telehealth: Payer: Self-pay | Admitting: Family Medicine

## 2022-11-18 NOTE — Telephone Encounter (Signed)
Requested medication (s) are due for refill today: routing for review  Requested medication (s) are on the active medication list: yes  Last refill:  01/12/22  Future visit scheduled:yes  Notes to clinic:  Unable to refill per protocol, last refill by another provider. Routing for review     Requested Prescriptions  Pending Prescriptions Disp Refills   celecoxib (CELEBREX) 200 MG capsule [Pharmacy Med Name: CELECOXIB 200 MG CAP] 180 capsule     Sig: TAKE 1 CAPSULE BY MOUTH TWICE DAILY     Analgesics:  COX2 Inhibitors Failed - 11/18/2022  1:16 PM      Failed - Manual Review: Labs are only required if the patient has taken medication for more than 8 weeks.      Passed - HGB in normal range and within 360 days    Hemoglobin  Date Value Ref Range Status  01/12/2022 15.6 11.1 - 15.9 g/dL Final         Passed - Cr in normal range and within 360 days    Creatinine  Date Value Ref Range Status  12/29/2011 0.57 (L) 0.60 - 1.30 mg/dL Final   Creatinine, Ser  Date Value Ref Range Status  01/12/2022 0.87 0.57 - 1.00 mg/dL Final         Passed - HCT in normal range and within 360 days    Hematocrit  Date Value Ref Range Status  01/12/2022 45.8 34.0 - 46.6 % Final         Passed - AST in normal range and within 360 days    AST  Date Value Ref Range Status  04/13/2022 25 0 - 40 IU/L Final         Passed - ALT in normal range and within 360 days    ALT  Date Value Ref Range Status  04/13/2022 20 0 - 32 IU/L Final         Passed - eGFR is 30 or above and within 360 days    EGFR (African American)  Date Value Ref Range Status  12/29/2011 >60 >56m/min Final   GFR calc Af Amer  Date Value Ref Range Status  11/26/2020 66 >59 mL/min/1.73 Final    Comment:    **In accordance with recommendations from the NKF-ASN Task force,**   [facility] is in the process of updating its eGFR calculation to the   2021 CKD-EPI creatinine equation that estimates kidney function   without a race  variable.    EGFR (Non-African Amer.)  Date Value Ref Range Status  12/29/2011 >60 >623mmin Final    Comment:    eGFR values <6053min/1.73 m2 may be an indication of chronic kidney disease (CKD). Calculated eGFR, using the MRDR Study equation, is useful in  patients with stable renal function. The eGFR calculation will not be reliable in acutely ill patients when serum creatinine is changing rapidly. It is not useful in patients on dialysis. The eGFR calculation may not be applicable to patients at the low and high extremes of body sizes, pregnant women, and vegetarians.    GFR calc non Af Amer  Date Value Ref Range Status  11/26/2020 57 (L) >59 mL/min/1.73 Final   eGFR  Date Value Ref Range Status  01/12/2022 67 >59 mL/min/1.73 Final         Passed - Patient is not pregnant      Passed - Valid encounter within last 12 months    Recent Outpatient Visits  7 months ago Encounter for annual wellness visit (AWV) in Medicare patient   [place], Dionne Bucy, MD   11 months ago Upper respiratory tract infection, unspecified type   [employer], Dani Gobble, PA-C   1 year ago Encounter for subsequent annual wellness visit (AWV) in Medicare patient   [employer], Dionne Bucy, MD   2 years ago Annual physical exam   [facility] [name], [place]   2 years ago Adverse effect of vaccine, initial encounter   [facility], Clearnce Sorrel, PA-C       Future Appointments             In 2 months Fletcher Anon, Mertie Clause, MD [place]. [place]

## 2022-11-19 MED ORDER — CELECOXIB 200 MG PO CAPS: 200.0000 mg | ORAL_CAPSULE | Freq: Two times a day (BID) | ORAL | 0 refills | Status: DC

## 2022-11-19 MED ORDER — CELECOXIB 200 MG PO CAPS: 200.0000 mg | ORAL_CAPSULE | Freq: Every day | ORAL | 0 refills | Status: DC

## 2022-11-19 NOTE — Telephone Encounter (Signed)
This lady said no one else has prescribed this to her except the providers at [facility].   If we aren't going to send it in, please call her and let her know.

## 2022-11-19 NOTE — Addendum Note (Signed)
Addended by: Shawna Orleans on: 11/19/2022 11:31 AM   Modules accepted: Orders

## 2022-11-24 DIAGNOSIS — K1121 Acute sialoadenitis: Secondary | ICD-10-CM | POA: Diagnosis not present

## 2022-12-10 DIAGNOSIS — H401123 Primary open-angle glaucoma, left eye, severe stage: Secondary | ICD-10-CM | POA: Diagnosis not present

## 2022-12-14 ENCOUNTER — Other Ambulatory Visit: Payer: Self-pay | Admitting: Cardiovascular Disease

## 2022-12-21 ENCOUNTER — Other Ambulatory Visit: Payer: Self-pay | Admitting: Family Medicine

## 2022-12-21 DIAGNOSIS — Z1231 Encounter for screening mammogram for malignant neoplasm of breast: Secondary | ICD-10-CM

## 2022-12-22 DIAGNOSIS — H6123 Impacted cerumen, bilateral: Secondary | ICD-10-CM | POA: Diagnosis not present

## 2022-12-22 DIAGNOSIS — J340 Abscess, furuncle and carbuncle of nose: Secondary | ICD-10-CM | POA: Diagnosis not present

## 2022-12-22 DIAGNOSIS — K1121 Acute sialoadenitis: Secondary | ICD-10-CM | POA: Diagnosis not present

## 2022-12-23 ENCOUNTER — Other Ambulatory Visit: Payer: Self-pay | Admitting: Cardiovascular Disease

## 2022-12-23 DIAGNOSIS — I482 Chronic atrial fibrillation, unspecified: Secondary | ICD-10-CM

## 2022-12-27 DIAGNOSIS — K1122 Acute recurrent sialoadenitis: Secondary | ICD-10-CM | POA: Diagnosis not present

## 2022-12-27 DIAGNOSIS — K115 Sialolithiasis: Secondary | ICD-10-CM | POA: Diagnosis not present

## 2022-12-28 ENCOUNTER — Other Ambulatory Visit: Payer: Self-pay | Admitting: Unknown Physician Specialty

## 2022-12-28 DIAGNOSIS — K115 Sialolithiasis: Secondary | ICD-10-CM

## 2023-01-05 DIAGNOSIS — K1121 Acute sialoadenitis: Secondary | ICD-10-CM | POA: Diagnosis not present

## 2023-01-07 ENCOUNTER — Other Ambulatory Visit (INDEPENDENT_AMBULATORY_CARE_PROVIDER_SITE_OTHER): Payer: Self-pay | Admitting: Nurse Practitioner

## 2023-01-07 DIAGNOSIS — I83893 Varicose veins of bilateral lower extremities with other complications: Secondary | ICD-10-CM

## 2023-01-10 ENCOUNTER — Encounter (INDEPENDENT_AMBULATORY_CARE_PROVIDER_SITE_OTHER): Payer: Self-pay | Admitting: Nurse Practitioner

## 2023-01-10 ENCOUNTER — Ambulatory Visit (INDEPENDENT_AMBULATORY_CARE_PROVIDER_SITE_OTHER): Payer: PPO

## 2023-01-10 ENCOUNTER — Ambulatory Visit (INDEPENDENT_AMBULATORY_CARE_PROVIDER_SITE_OTHER): Payer: PPO | Admitting: Nurse Practitioner

## 2023-01-10 VITALS — BP 139/82 | HR 72 | Resp 16 | Wt 195.8 lb

## 2023-01-10 DIAGNOSIS — E7800 Pure hypercholesterolemia, unspecified: Secondary | ICD-10-CM

## 2023-01-10 DIAGNOSIS — I10 Essential (primary) hypertension: Secondary | ICD-10-CM

## 2023-01-10 DIAGNOSIS — I83893 Varicose veins of bilateral lower extremities with other complications: Secondary | ICD-10-CM

## 2023-01-11 ENCOUNTER — Encounter (INDEPENDENT_AMBULATORY_CARE_PROVIDER_SITE_OTHER): Payer: Self-pay | Admitting: Nurse Practitioner

## 2023-01-11 ENCOUNTER — Ambulatory Visit
Admission: RE | Admit: 2023-01-11 | Discharge: 2023-01-11 | Disposition: A | Discharge status: Home / Self Care | Payer: PPO | Source: Ambulatory Visit | Attending: Family Medicine | Admitting: Family Medicine

## 2023-01-11 ENCOUNTER — Other Ambulatory Visit: Payer: Self-pay | Admitting: Family Medicine

## 2023-01-11 DIAGNOSIS — Z1231 Encounter for screening mammogram for malignant neoplasm of breast: Secondary | ICD-10-CM

## 2023-01-11 NOTE — Progress Notes (Signed)
Subjective:    Patient ID: Teresa Harris, female    DOB: [birth year], 82 y.o.   MRN: [id number] Chief Complaint  Patient presents with   Follow-up    Ultrasound follow up    The patient presents today for follow-up studies regarding spontaneous varicose vein bleeding.  The patient most recently has had some spontaneous bleeding from her bilateral ankle area.  The patient had a previous episode of bleeding nearly 3 years ago.  For this she underwent sclerotherapy and has done relatively well.  Today noninvasive studies show that she continues to have intact ablated great saphenous veins with no evidence of recurrent reflux.  The patient denies any ulceration or superficial thrombophlebitis.  She denies any fever, chills or signs or symptoms of cellulitis.  The patient continues to utilize conservative therapy including medical grade 1 compression stockings as well as elevation and exercise.  However the patient's greatest concern is another bleeding event that she may not be able to stop.  Today noninvasive studies show patent previous great saphenous vein ablations.  There is no evidence of DVT or superficial venous thrombosis bilaterally.  No evidence of deep venous insufficiency.          Review of Systems  Cardiovascular:  Positive for leg swelling.  Hematological:  Bruises/bleeds easily.  All other systems reviewed and are negative.      Objective:   Physical Exam Vitals reviewed.  HENT:     Head: Normocephalic.  Cardiovascular:     Rate and Rhythm: Normal rate.     Pulses: Normal pulses.  Pulmonary:     Effort: Pulmonary effort is normal.  Skin:    General: Skin is warm and dry.     Capillary Refill: Capillary refill takes less than 2 seconds.     Comments: Extensive varicosities near the bilateral ankle areas  Neurological:     Mental Status: She is alert and oriented to person, place, and time.  Psychiatric:        Mood and Affect: Mood normal.         Behavior: Behavior normal.        Thought Content: Thought content normal.        Judgment: Judgment normal.     BP 139/82 (BP Location: Right Arm)   Pulse 72   Resp 16   Wt 195 lb 12.8 oz (88.8 kg)   BMI 30.67 kg/m   Past Medical History:  Diagnosis Date   (HFpEF) heart failure with preserved ejection fraction ([place])    a. 12/2018 Echo: EF 60-65%, no rwma, mild MR. Mildly dil LA. Nl RV fxn. PASP 82mHg.   Adverse effect of anesthesia 11/18/2015   Arthritis    Coccyalgia 27/37/1062  Complication of anesthesia    trouble waking up with Propofol   Depression    h/o   Family history of adverse reaction to anesthesia    daughter PONV   Glaucoma    left eye   Headache    H/O MIGRAINES   Hemorrhoids    Hiatal hernia    History of kidney stones    History of migraine headaches    History of stress test    a. 10/2014 MV: No ischemia.   Hx MRSA infection    Hypercholesteremia    Motion sickness    boats   Neuropathy    feet, fingers   Osteopenia    [place] 10/[place]   a. CHA2DS2VASc = 3-->eliquis.  PONV (postoperative nausea and vomiting)    Wears dentures    full upper, partial lower   Wears hearing aid in both ears     Social History   Socioeconomic History   Marital status: Divorced    Spouse name: Not on file   Number of children: 2   Years of education: Not on file   Highest education level: Some college, no degree  Occupational History   Occupation: retired  Tobacco Use   Smoking status: Former    Packs/day: 1.00    Years: 30.00    Total pack years: 30.00    Types: Cigarettes    Quit date: 02/09/1996    Years since quitting: 26.9   Smokeless tobacco: Never  Vaping Use   Vaping Use: Never used  Substance and Sexual Activity   Alcohol use: Yes    Comment: maybe twice a year- 1 mixed drink   Drug use: No   Sexual activity: Not on file  Other Topics Concern   Not on file  Social History Narrative   Not on file    Social Determinants of Health   Financial Resource Strain: Low Risk  (03/17/2020)   Overall Financial Resource Strain (CARDIA)    Difficulty of Paying Living Expenses: Not hard at all  Food Insecurity: No Food Insecurity (03/17/2020)   Hunger Vital Sign    Worried About Running Out of Food in the Last Year: Never true    [place] in the Last Year: Never true  Transportation Needs: No Transportation Needs (03/17/2020)   PRAPARE - Hydrologist (Medical): No    Lack of Transportation (Non-Medical): No  Physical Activity: Inactive (03/17/2020)   Exercise Vital Sign    Days of Exercise per Week: 0 days    Minutes of Exercise per Session: 0 min  Stress: No Stress Concern Present (03/17/2020)   [place]    Feeling of Stress : Only a little  Social Connections: Socially Isolated (03/17/2020)   Social Connection and Isolation Panel [NHANES]    Frequency of Communication with Friends and Family: More than three times a week    Frequency of Social Gatherings with Friends and Family: More than three times a week    Attends Religious Services: Never    Marine scientist or Organizations: No    Attends Archivist Meetings: Never    Marital Status: Divorced  Human resources officer Violence: Not At Risk (03/17/2020)   Humiliation, Afraid, Rape, and Kick questionnaire    Fear of Current or Ex-Partner: No    Emotionally Abused: No    Physically Abused: No    Sexually Abused: No    Past Surgical History:  Procedure Laterality Date   ABDOMINAL HYSTERECTOMY     AUGMENTATION MAMMAPLASTY Bilateral 2004   BROW LIFT Bilateral 12/19/2020   Procedure: BLEPHAROPLASTY UPPER EYELID; W/EXCESS SKIN BLEPHAROPTOSIS REPAIR; RESECT EX BILATERAL;  Surgeon: Karle Starch, MD;  Location: [facility];  Service: Ophthalmology;  Laterality: Bilateral;   CARPAL TUNNEL RELEASE Right 02/16/2016    Procedure: CARPAL TUNNEL RELEASE;  Surgeon: Dereck Leep, MD;  Location: [facility];  Service: Orthopedics;  Laterality: Right;   CARPAL TUNNEL RELEASE Left 04/12/2016   Procedure: CARPAL TUNNEL RELEASE;  Surgeon: Dereck Leep, MD;  Location: [facility];  Service: Orthopedics;  Laterality: Left;   CARPAL TUNNEL RELEASE Right 03/13/2018   Procedure: CARPAL TUNNEL RELEASE;  Surgeon: Deetta Perla, MD;  Location: [facility];  Service: Neurosurgery;  Laterality: Right;   CATARACT EXTRACTION, BILATERAL     COLONOSCOPY  12/29/2009   Dr Dionne Milo   EYE SURGERY Left    shunt placed   EYE SURGERY Left    tube placed   FOOT SURGERY     bilateral    JOINT REPLACEMENT     NASAL SEPTUM SURGERY     REPLACEMENT TOTAL KNEE     bilateral   SHOULDER SURGERY     right shoulder   TOE SURGERY Left    2023    Family History  Problem Relation Age of Onset   Arthritis Mother    Arthritis Father    Breast cancer Neg Hx     Allergies  Allergen Reactions   [name] [Moxifloxacin Hcl In Nacl] Other (See Comments)    Headache   Bimatoprost Other (See Comments)    Made my eyes red, and hurt   Brimonidine Other (See Comments)    Made my eyes red, and hurt Burned eyes.   Clindamycin/Lincomycin Diarrhea    Severe diarrhea   Eszopiclone    Mobic [Meloxicam] Other (See Comments)    headache   Propofol Other (See Comments)    Had difficulty waking up Difficult to wake up.   Travoprost Other (See Comments)    Unknown   Vicodin [Hydrocodone-Acetaminophen] Nausea And Vomiting and Other (See Comments)    Sick on stomach   Cephalexin Nausea And Vomiting and Other (See Comments)    headache       Assessment & Plan:   1. Bleeding from varicose veins of lower extremity, bilateral Recommend:  The patient has had successful ablation of the previously incompetent saphenous venous system but still has persistent symptoms of spontaneous bleeding and hemorrhage in addition to pain and swelling that are causing  negative effect on the patient's life.  Patient should undergo injection sclerotherapy to treat the residual varicosities.  The risks, benefits and alternative therapies were reviewed in detail with the patient.  All questions were answered.  The patient agrees to proceed with sclerotherapy at their convenience.  The patient will continue wearing the graduated compression stockings and using the over-the-counter pain medications to treat her symptoms.       2. Essential hypertension Continue antihypertensive medications as already ordered, these medications have been reviewed and there are no changes at this time.   3. Pure hypercholesterolemia Continue statin as ordered and reviewed, no changes at this time    Current Outpatient Medications on File Prior to Visit  Medication Sig Dispense Refill   apixaban (ELIQUIS) 5 MG TABS tablet Take 1 tablet (5 mg total) by mouth 2 (two) times daily. 180 tablet 1   Calcium Carbonate-Vitamin D (CALCIUM-VITAMIN D3 PO) Take 1 tablet by mouth daily. Calcium 600 = D3 1000     celecoxib (CELEBREX) 200 MG capsule Take 1 capsule (200 mg total) by mouth 2 (two) times daily. Take 200 mg by mouth daily. 60 capsule 0   Cholecalciferol (VITAMIN D3) 50 MCG (2000 UT) capsule Take by mouth in the morning and at bedtime.     cycloSPORINE (RESTASIS) 0.05 % ophthalmic emulsion Place 1 drop into both eyes 2 (two) times daily.      dorzolamide-timolol (COSOPT) 22.3-6.8 MG/ML ophthalmic solution Place 1 drop into both eyes 2 (two) times daily.      fenofibrate (TRICOR) 145 MG tablet TAKE 1 TABLET BY MOUTH ONCE DAILY 90 tablet 1  furosemide (LASIX) 20 MG tablet TAKE 1 TABLET BY MOUTH ONCE DAILY 90 tablet 0   latanoprost (XALATAN) 0.005 % ophthalmic solution Place 1 drop into both eyes daily.      Magnesium 400 MG CAPS Take 400 mg by mouth daily.      metoprolol succinate (TOPROL-XL) 25 MG 24 hr tablet TAKE 1/2 TABLET BY MOUTH ONCE DAILY 45 tablet 1   METRONIDAZOLE,  TOPICAL, 0.75 % LOTN Apply topically daily. Apply to face in AM     Multiple Vitamin (MULTIVITAMIN) tablet Take 1 tablet by mouth daily.     NON FORMULARY Place 12 sprays into both nostrils 6 (six) times daily. 250GU irrigant     Omega-3 Fatty Acids (FISH OIL) 1200 MG CAPS Take by mouth 2 (two) times daily.     omeprazole (PRILOSEC) 40 MG capsule Take 40 mg by mouth every morning.      pregabalin (LYRICA) 100 MG capsule TAKE 1 CAPSULE BY MOUTH ONCE DAILY 90 capsule 1   PREMARIN 0.625 MG tablet TAKE 1 TABLET BY MOUTH ONCE DAILY 90 tablet 0   SOOLANTRA 1 % CREA Apply 1 application topically daily. Sometimes uses twice daily as needed for flare ups.     losartan (COZAAR) 25 MG tablet Take 0.5 tablets (12.5 mg total) by mouth daily. (Patient not taking: Reported on 08/06/2022) 90 tablet 0   mupirocin ointment (BACTROBAN) 2 % Apply 1 application topically 2 (two) times daily. (Patient not taking: Reported on 08/06/2022) 22 g 0   No current facility-administered medications on file prior to visit.    There are no Patient Instructions on file for this visit. No follow-ups on file.   Kris Hartmann, NP

## 2023-01-13 ENCOUNTER — Ambulatory Visit
Admission: RE | Admit: 2023-01-13 | Discharge: 2023-01-13 | Disposition: A | Discharge status: Home / Self Care | Payer: PPO | Source: Ambulatory Visit | Attending: Unknown Physician Specialty | Admitting: Unknown Physician Specialty

## 2023-01-13 DIAGNOSIS — K118 Other diseases of salivary glands: Secondary | ICD-10-CM | POA: Diagnosis not present

## 2023-01-13 DIAGNOSIS — K115 Sialolithiasis: Secondary | ICD-10-CM

## 2023-01-13 MED ORDER — IOPAMIDOL (ISOVUE-300) INJECTION 61%
75.0000 mL | Freq: Once | INTRAVENOUS | Status: AC | PRN
  Administered 2023-01-13: 75 mL via INTRAVENOUS

## 2023-01-19 DIAGNOSIS — K115 Sialolithiasis: Secondary | ICD-10-CM | POA: Diagnosis not present

## 2023-01-24 ENCOUNTER — Other Ambulatory Visit: Payer: Self-pay | Admitting: Family Medicine

## 2023-02-03 ENCOUNTER — Other Ambulatory Visit: Payer: Self-pay | Admitting: Family Medicine

## 2023-02-03 DIAGNOSIS — N951 Menopausal and female climacteric states: Secondary | ICD-10-CM

## 2023-02-03 NOTE — Telephone Encounter (Signed)
Pt stated her pharmacy system is down and she was told to ask pcp office to call in the refill request   Medication Refill - Medication: PREMARIN 0.625 MG tablet   Has the patient contacted their pharmacy? Yes.    Preferred Pharmacy (with phone number or street name):  [facility], [place]. Phone: [phone]  Fax: [phone]     Has the patient been seen for an appointment in the last year OR does the patient have an upcoming appointment? Yes.    Agent: Please be advised that RX refills may take up to 3 business days. We ask that you follow-up with your pharmacy.

## 2023-02-04 DIAGNOSIS — H401111 Primary open-angle glaucoma, right eye, mild stage: Secondary | ICD-10-CM | POA: Diagnosis not present

## 2023-02-04 DIAGNOSIS — H401123 Primary open-angle glaucoma, left eye, severe stage: Secondary | ICD-10-CM | POA: Diagnosis not present

## 2023-02-04 DIAGNOSIS — H401133 Primary open-angle glaucoma, bilateral, severe stage: Secondary | ICD-10-CM | POA: Diagnosis not present

## 2023-02-04 NOTE — Telephone Encounter (Signed)
Requested medication (s) are due for refill today: yes  Requested medication (s) are on the active medication list: yes  Last refill:  11/03/22  Future visit scheduled: yes  Notes to clinic:  Unable to refill per protocol, routing for approval, how should patient take the medication.      Requested Prescriptions  Pending Prescriptions Disp Refills   estrogens, conjugated, (PREMARIN) 0.625 MG tablet 90 tablet 0    Sig: Take 1 tablet (0.625 mg total) by mouth daily. Take daily for 21 days then do not take for 7 days.     OB/GYN:  Estrogens Failed - 02/03/2023  4:05 PM      Failed - Mammogram is up-to-date per Health Maintenance      Passed - Last BP in normal range    BP Readings from Last 1 Encounters:  01/10/23 139/82         Passed - Valid encounter within last 12 months    Recent Outpatient Visits           9 months ago Encounter for annual wellness visit (AWV) in Medicare patient   [place], Dionne Bucy, MD   1 year ago Upper respiratory tract infection, unspecified type   [place], Dani Gobble, PA-C   1 year ago Encounter for subsequent annual wellness visit (AWV) in Medicare patient   [place], Dionne Bucy, MD   2 years ago Annual physical exam   [place], [place]   3 years ago Adverse effect of vaccine, initial encounter   Boyce, Clearnce Sorrel, [place]       Future Appointments             In 6 days Fletcher Anon, Mertie Clause, MD [place] [facility] [place]   In 2 months Bacigalupo, Dionne Bucy, MD [facility], [place]

## 2023-02-07 MED ORDER — ESTROGENS CONJUGATED 0.625 MG PO TABS: 0.6250 mg | ORAL_TABLET | Freq: Every day | ORAL | 1 refills | Status: DC

## 2023-02-10 ENCOUNTER — Ambulatory Visit: Payer: PPO | Attending: Cardiovascular Disease | Admitting: Cardiovascular Disease

## 2023-02-10 ENCOUNTER — Encounter: Payer: Self-pay | Admitting: Cardiovascular Disease

## 2023-02-10 VITALS — BP 134/88 | HR 70 | Ht 68.0 in | Wt 196.0 lb

## 2023-02-10 DIAGNOSIS — I5032 Chronic diastolic (congestive) heart failure: Secondary | ICD-10-CM | POA: Diagnosis not present

## 2023-02-10 DIAGNOSIS — I482 Chronic atrial fibrillation, unspecified: Secondary | ICD-10-CM | POA: Diagnosis not present

## 2023-02-10 DIAGNOSIS — E785 Hyperlipidemia, unspecified: Secondary | ICD-10-CM | POA: Diagnosis not present

## 2023-02-10 NOTE — Progress Notes (Signed)
Cardiology Office Note   Date:  02/10/2023   ID:  Teresa Harris, DOB 1941-03-11, MRN [id number]  PCP:  Virginia Crews, MD  Cardiologist:   Kathlyn Sacramento, MD   Chief Complaint  Patient presents with   Follow-up    6 month F/U-No new cardiac concerns      History of Present Illness: Teresa Harris is a 82 y.o. female who presents for a follow-up regarding chronic atrial fibrillation.  She has borderline diabetes and hyperlipidemia not requiring medications. She smoked in the past more than 30 years ago. There is no family history of coronary artery disease or arrhythmia.  Nuclear stress test in November 2015 which showed no evidence of ischemia with normal ejection fraction. Echocardiogram showed normal EF with mild to moderate mitral regurgitation and mildly dilated left atrium.  She is only able to tolerate small dose metoprolol due to extreme fatigue.    She did have mild diastolic heart failure in early 2023 that required adding small dose furosemide.  Most recent echocardiogram in January 2020 showed an EF of 60 to 65% with mild pulmonary hypertension.  During last visit, I discontinued digoxin.  She continues to take metoprolol and Eliquis.  She had a recent sinus infection but mostly recovered.  In addition, she is recovering from bilateral glaucoma surgery.  She reports stable shortness of breath with no chest pain.  Past Medical History:  Diagnosis Date   (HFpEF) heart failure with preserved ejection fraction ([place])    a. 12/2018 Echo: EF 60-65%, no rwma, mild MR. Mildly dil LA. Nl RV fxn. PASP 1mHg.   Adverse effect of anesthesia 11/18/2015   Arthritis    Coccyalgia [id number]  Complication of anesthesia    trouble waking up with Propofol   Depression    h/o   Family history of adverse reaction to anesthesia    daughter PONV   Glaucoma    left eye   Headache    H/O MIGRAINES   Hemorrhoids    Hiatal hernia    History of kidney stones     History of migraine headaches    History of stress test    a. 10/2014 MV: No ischemia.   Hx MRSA infection    Hypercholesteremia    Motion sickness    boats   Neuropathy    feet, fingers   Osteopenia    Permanent atrial fibrillation ([place] 10/01/2014   a. CHA2DS2VASc = 3-->eliquis.   PONV (postoperative nausea and vomiting)    Wears dentures    full upper, partial lower   Wears hearing aid in both ears     Past Surgical History:  Procedure Laterality Date   ABDOMINAL HYSTERECTOMY     AUGMENTATION MAMMAPLASTY Bilateral 2004   BROW LIFT Bilateral 12/19/2020   Procedure: BLEPHAROPLASTY UPPER EYELID; W/EXCESS SKIN BLEPHAROPTOSIS REPAIR; RESECT EX BILATERAL;  Surgeon: FKarle Starch MD;  [place] [facility]  Service: Ophthalmology;  Laterality: Bilateral;   CARPAL TUNNEL RELEASE Right 02/16/2016   Procedure: CARPAL TUNNEL RELEASE;  Surgeon: JDereck Leep MD;  Location: [facility];  Service: Orthopedics;  Laterality: Right;   CARPAL TUNNEL RELEASE Left 04/12/2016   Procedure: CARPAL TUNNEL RELEASE;  Surgeon: JDereck Leep MD;  Location: [facility];  Service: Orthopedics;  Laterality: Left;   CARPAL TUNNEL RELEASE Right 03/13/2018   Procedure: CARPAL TUNNEL RELEASE;  Surgeon: CDeetta Perla MD;  Location: [facility];  Service: Neurosurgery;  Laterality: Right;   CATARACT EXTRACTION,  BILATERAL     COLONOSCOPY  12/29/2009   Dr Dionne Milo   EYE SURGERY Left    shunt placed   EYE SURGERY Left    tube placed   FOOT SURGERY     bilateral    JOINT REPLACEMENT     NASAL SEPTUM SURGERY     REPLACEMENT TOTAL KNEE     bilateral   SHOULDER SURGERY     right shoulder   TOE SURGERY Left    2023     Current Outpatient Medications  Medication Sig Dispense Refill   apixaban (ELIQUIS) 5 MG TABS tablet Take 1 tablet (5 mg total) by mouth 2 (two) times daily. 180 tablet 1   Calcium Carbonate-Vitamin D (CALCIUM-VITAMIN D3 PO) Take 1 tablet by mouth daily. Calcium 600 = D3 1000      celecoxib (CELEBREX) 200 MG capsule TAKE 1 CAPSULE BY MOUTH TWICE DAILY 180 capsule 0   Cholecalciferol (VITAMIN D3) 50 MCG (2000 UT) capsule Take by mouth in the morning and at bedtime.     cycloSPORINE (RESTASIS) 0.05 % ophthalmic emulsion Place 1 drop into both eyes 2 (two) times daily.      dorzolamide-timolol (COSOPT) 22.3-6.8 MG/ML ophthalmic solution Place 1 drop into both eyes 2 (two) times daily.      estrogens, conjugated, (PREMARIN) 0.625 MG tablet Take 1 tablet (0.625 mg total) by mouth daily. Take daily for 21 days then do not take for 7 days. 90 tablet 1   fenofibrate (TRICOR) 145 MG tablet TAKE 1 TABLET BY MOUTH ONCE DAILY 90 tablet 1   furosemide (LASIX) 20 MG tablet TAKE 1 TABLET BY MOUTH ONCE DAILY 90 tablet 0   latanoprost (XALATAN) 0.005 % ophthalmic solution Place 1 drop into both eyes daily.      Magnesium 400 MG CAPS Take 400 mg by mouth daily.      metoprolol succinate (TOPROL-XL) 25 MG 24 hr tablet TAKE 1/2 TABLET BY MOUTH ONCE DAILY 45 tablet 1   METRONIDAZOLE, TOPICAL, 0.75 % LOTN Apply topically daily. Apply to face in AM     Multiple Vitamin (MULTIVITAMIN) tablet Take 1 tablet by mouth daily.     NON FORMULARY Place 12 sprays into both nostrils 6 (six) times daily. 250GU irrigant     Omega-3 Fatty Acids (FISH OIL) 1200 MG CAPS Take by mouth 2 (two) times daily.     omeprazole (PRILOSEC) 40 MG capsule Take 40 mg by mouth every morning.      pregabalin (LYRICA) 100 MG capsule TAKE 1 CAPSULE BY MOUTH ONCE DAILY 90 capsule 1   SOOLANTRA 1 % CREA Apply 1 application topically daily. Sometimes uses twice daily as needed for flare ups.     No current facility-administered medications for this visit.    Allergies:   [name] [moxifloxacin hcl in nacl], Bimatoprost, Brimonidine, Clindamycin/lincomycin, Eszopiclone, Mobic [meloxicam], Propofol, Travoprost, Vicodin [hydrocodone-acetaminophen], and Cephalexin    Social History:  The patient  reports that she quit smoking about  27 years ago. Her smoking use included cigarettes. She has a 30.00 pack-year smoking history. She has never used smokeless tobacco. She reports current alcohol use. She reports that she does not use drugs.   Family History:  The patient's family history includes Arthritis in her father and mother.    ROS:  Please see the history of present illness.   Otherwise, review of systems are positive for none.   All other systems are reviewed and negative.    PHYSICAL EXAM: VS:  BP 134/88 (BP Location: Left Arm, Patient Position: Sitting, Cuff Size: Normal)   Pulse 70   Ht '5\' 8"'$  (1.727 m)   Wt 196 lb (88.9 kg)   BMI 29.80 kg/m  , BMI Body mass index is 29.8 kg/m. GEN: Well nourished, well developed, in no acute distress  HEENT: normal  Neck: No JVD, carotid bruits, or masses Cardiac: Irregularly irregular ; no murmurs, rubs, or gallops, trace bilateral leg edema edema  Respiratory:  clear to auscultation bilaterally, normal work of breathing GI: soft, nontender, nondistended, + BS MS: no deformity or atrophy  Skin: warm and dry, no rash Neuro:  Strength and sensation are intact Psych: euthymic mood, full affect   EKG:  EKG is  ordered today. EKG showed atrial fibrillation with low voltage and poor R wave progression in the anterior leads.  Recent Labs: 04/13/2022: ALT 20    Lipid Panel    Component Value Date/Time   CHOL 193 04/13/2022 1111   TRIG 130 04/13/2022 1111   HDL 59 04/13/2022 1111   CHOLHDL 3.3 04/13/2022 1111   CHOLHDL 3.7 07/17/2020 1010   VLDL 40 07/17/2020 1010   LDLCALC 111 (H) 04/13/2022 1111      Wt Readings from Last 3 Encounters:  02/10/23 196 lb (88.9 kg)  01/10/23 195 lb 12.8 oz (88.8 kg)  08/06/22 197 lb 12.8 oz (89.7 kg)       ASSESSMENT AND PLAN:  1. Chronic atrial fibrillation: Ventricular rate remains controlled even after stopping digoxin during last visit.  Continue Toprol 12.5 mg once daily.  Continue anticoagulation with Eliquis.    2.   Chronic diastolic heart failure: She appears to be euvolemic on small dose furosemide.   3.  Hyperlipidemia: Most recent lipid profile showed an LDL of 111.  She is currently on fenofibrate for history of elevated triglyceride.  Most recent triglyceride was 130.  Will await the results of follow-up lipid profile and consider switching to a statin.   Disposition:   FU with me in 6 months  Signed,  Kathlyn Sacramento, MD  02/10/2023 11:29 AM    [facility]

## 2023-02-10 NOTE — Patient Instructions (Signed)
Medication Instructions:  No changes *If you need a refill on your cardiac medications before your next appointment, please call your pharmacy*   Lab Work: None ordered If you have labs (blood work) drawn today and your tests are completely normal, you will receive your results only by: [place] (if you have MyChart) OR A paper copy in the mail If you have any lab test that is abnormal or we need to change your treatment, we will call you to review the results.   Testing/Procedures: None ordered   Follow-Up: At [facility], you and your health needs are our priority.  As part of our continuing mission to provide you with exceptional heart care, we have created designated Provider Care Teams.  These Care Teams include your primary Cardiologist (physician) and Advanced Practice Providers (APPs -  Physician Assistants and Nurse Practitioners) who all work together to provide you with the care you need, when you need it.  We recommend signing up for the patient portal called "MyChart".  Sign up information is provided on this After Visit Summary.  MyChart is used to connect with patients for Virtual Visits (Telemedicine).  Patients are able to view lab/test results, encounter notes, upcoming appointments, etc.  Non-urgent messages can be sent to your provider as well.   To learn more about what you can do with MyChart, go to [email or web address].    Your next appointment:   6 month(s)  Provider:   You may see Kathlyn Sacramento, MD or one of the following Advanced Practice Providers on your designated Care Team:   Murray Hodgkins, NP Christell Faith, PA-C Cadence Kathlen Mody, PA-C Gerrie Nordmann, NP

## 2023-03-15 ENCOUNTER — Other Ambulatory Visit: Payer: Self-pay | Admitting: Cardiovascular Disease

## 2023-03-17 ENCOUNTER — Other Ambulatory Visit: Payer: Self-pay

## 2023-03-17 DIAGNOSIS — E1169 Type 2 diabetes mellitus with other specified complication: Secondary | ICD-10-CM

## 2023-03-18 ENCOUNTER — Telehealth: Payer: Self-pay

## 2023-03-18 NOTE — Progress Notes (Signed)
Care Management & Coordination Services Pharmacy Team  Reason for Encounter: Appointment Reminder  Contacted patient to confirm telephone appointment with Julious Payer, PharmD on 03/22/2023 at 10:00 am.  Spoke with patient on 03/18/2023   Do you have any problems getting your medications? Yes If yes what types of problems are you experiencing? Financial barriers, Patient states she will have issue affording her medication when she is in the donut whole with her insurance.Patient states at this moment she is able to afford her medicine.  What is your top health concern you would like to discuss at your upcoming visit?  Overall health  Have you seen any other providers since your last visit with PCP? Yes   Chart review:  Recent office visits:  04/13/2022 Dr. Beryle Flock MD (PCP) No Medication changes noted, Next appointment is 04/18/2023.  Recent consult visits:  02/10/2023 Dr. Kirke Corin MD (Cardiology) No Medication changes noted 01/10/2023 Sheppard Plumber NP (Vascular Surgery) No medication changes noted 11/09/2022 Dr. Lemar Livings MD (General Surgery)  No Medication changes noted 10/05/2022 Dr. Lemar Livings MD (General Surgery) No Medication changes noted  Hospital visits:  None in previous 6 months   Star Rating Drugs:  None ID   Care Gaps: Annual wellness visit in last year? Yes Last completed 04/13/2022 Shingrix Vaccine Dtap Vaccine COVID-19 Vaccine  If Diabetic: Last eye exam / retinopathy screening:None IS Last diabetic foot exam:None ID Hemoglobin A1C Diabetic Kidney Evaluation eGFR Mesaurement    Everlean Cherry Clinical Pharmacist Assistant [phone]

## 2023-03-22 ENCOUNTER — Ambulatory Visit: Payer: PPO

## 2023-03-22 DIAGNOSIS — E1169 Type 2 diabetes mellitus with other specified complication: Secondary | ICD-10-CM

## 2023-03-22 DIAGNOSIS — I482 Chronic atrial fibrillation, unspecified: Secondary | ICD-10-CM

## 2023-03-22 DIAGNOSIS — M8589 Other specified disorders of bone density and structure, multiple sites: Secondary | ICD-10-CM

## 2023-03-22 NOTE — Progress Notes (Unsigned)
Care Management & Coordination Services Pharmacy Note  03/22/2023 Name:  Teresa Harris MRN:  [id number] DOB:  13-May-1941  Summary: Patient presents for initial follow-up.   -Counseled on risks of NSAIDs due to increased bleeding risk with anticoagulants; patient was concerned about stopping her meloxicam.   -Patient is at very high risk of fracture given 10-year probability of major osteoporotic fracture is >20% and probability of hip fracture is >3%. We discussed various treatment options, she will discuss with her PCP at her next follow-up.   -Patient was told by Cardiology she should try to taper off Premarin due to long-term risks with the medication. Patient states she has done this previously in the past and would subsequently struggle with recurrence of symptoms.   Recommendations/Changes made from today's visit: -Recommended discussing estradiol cream at PCP follow-up to help facilitate taper.  -Recommend starting Prolia -Recommend stopping fenofibrate, starting rosuvastatin 20 mg daily. Will defer until lipid profile recheck.   Follow up plan: CPP follow-up 6 months   Subjective: Teresa Harris is an 82 y.o. year old female who is a primary patient of Bacigalupo, Marzella Schlein, MD.  The care coordination team was consulted for assistance with disease management and care coordination needs.    Engaged with patient by telephone for initial visit.  Recent office visits: 04/13/2022 Dr. Beryle Flock MD (PCP) No Medication changes noted, Next appointment is 04/18/2023.   Recent consult visits: 02/10/2023 Dr. Kirke Corin MD (Cardiology) No Medication changes noted 01/10/2023 Sheppard Plumber NP (Vascular Surgery) No medication changes noted 11/09/2022 Dr. Lemar Livings MD (General Surgery)  No Medication changes noted 10/05/2022 Dr. Lemar Livings MD (General Surgery) No Medication changes noted  Hospital visits: None in previous 6 months   Objective:  Lab Results  Component Value Date    CREATININE 0.87 01/12/2022   BUN 20 01/12/2022   EGFR 67 01/12/2022   GFRNONAA 57 (L) 11/26/2020   GFRAA 66 11/26/2020   NA 140 01/12/2022   K 4.5 01/12/2022   CALCIUM 9.2 01/12/2022   CO2 25 01/12/2022   GLUCOSE 92 01/12/2022    Lab Results  Component Value Date/Time   HGBA1C 6.0 (H) 04/13/2022 11:11 AM   HGBA1C 7.2 (H) 04/03/2021 11:49 AM    Last diabetic Eye exam: No results found for: "HMDIABEYEEXA"  Last diabetic Foot exam: No results found for: "HMDIABFOOTEX"   Lab Results  Component Value Date   CHOL 193 04/13/2022   HDL 59 04/13/2022   LDLCALC 111 (H) 04/13/2022   TRIG 130 04/13/2022   CHOLHDL 3.3 04/13/2022       Latest Ref Rng & Units 04/13/2022   11:11 AM 04/03/2021   11:49 AM 07/17/2020   10:10 AM  Hepatic Function  Total Protein 6.0 - 8.5 g/dL 7.1  7.3  7.4   Albumin 3.7 - 4.7 g/dL 4.2  4.4  3.8   AST 0 - 40 IU/L ALT 0 - 32 IU/L Alk Phosphatase 44 - 121 IU/L 50  59  39   Total Bilirubin 0.0 - 1.2 mg/dL 0.5  0.6  0.9   Bilirubin, Direct 0.00 - 0.40 mg/dL 0.86       Lab Results  Component Value Date/Time   TSH 1.901 12/07/2018 04:51 PM   TSH 1.050 11/27/2015 10:21 AM   TSH 1.340 07/05/2013 10:21 AM       Latest Ref Rng & Units 01/12/2022   12:21 PM 04/03/2021  11:49 AM 11/26/2020   10:48 AM  CBC  WBC 3.4 - 10.8 x10E3/uL 5.3  4.8  4.8   Hemoglobin 11.1 - 15.9 g/dL 56.2  13.0  86.5   Hematocrit 34.0 - 46.6 % 45.8  48.3  46.7   Platelets 150 - 450 x10E3/uL 162  175  171     Lab Results  Component Value Date/Time   VD25OH 67.6 04/03/2021 11:49 AM   VD25OH 64.5 04/16/2020 12:06 PM    Clinical ASCVD: No  The ASCVD Risk score (Arnett DK, et al., 2019) failed to calculate for the following reasons:   The 2019 ASCVD risk score is only valid for ages 76 to 43       04/13/2022   10:32 AM 04/03/2021   11:13 AM 04/03/2021   11:11 AM  Depression screen PHQ 2/9  Decreased Interest 0 0 0  Down, Depressed, Hopeless 0 0 0   PHQ - 2 Score 0 0 0  Altered sleeping 0 2   Tired, decreased energy 0 0   Change in appetite 0 0   Feeling bad or failure about yourself  0 0   Trouble concentrating 0 0   Moving slowly or fidgety/restless 0 0   Suicidal thoughts 0 0   PHQ-9 Score 0 2   Difficult doing work/chores Not difficult at all Not difficult at all      Social History   Tobacco Use  Smoking Status Former   Packs/day: 1.00   Years: 30.00   Additional pack years: 0.00   Total pack years: 30.00   Types: Cigarettes   Quit date: 02/09/1996   Years since quitting: 27.1  Smokeless Tobacco Never   BP Readings from Last 3 Encounters:  02/10/23 134/88  01/10/23 139/82  08/06/22 132/80   Pulse Readings from Last 3 Encounters:  02/10/23 70  01/10/23 72  08/06/22 72   Wt Readings from Last 3 Encounters:  02/10/23 196 lb (88.9 kg)  01/10/23 195 lb 12.8 oz (88.8 kg)  08/06/22 197 lb 12.8 oz (89.7 kg)   BMI Readings from Last 3 Encounters:  02/10/23 29.80 kg/m  01/10/23 30.67 kg/m  08/06/22 30.98 kg/m    Allergies  Allergen Reactions   [name] [Moxifloxacin Hcl In Nacl] Other (See Comments)    Headache   Bimatoprost Other (See Comments)    Made my eyes red, and hurt   Brimonidine Other (See Comments)    Made my eyes red, and hurt Burned eyes.   Clindamycin/Lincomycin Diarrhea    Severe diarrhea   Eszopiclone    Mobic [Meloxicam] Other (See Comments)    headache   Propofol Other (See Comments)    Had difficulty waking up Difficult to wake up.   Travoprost Other (See Comments)    Unknown   Vicodin [Hydrocodone-Acetaminophen] Nausea And Vomiting and Other (See Comments)    Sick on stomach   Cephalexin Nausea And Vomiting and Other (See Comments)    headache    Medications Reviewed Today     Reviewed by Thayer Headings, Ruthell Rummage (Certified Medical Assistant) on 02/10/23 at 1113  Med List Status: <None>   Medication Order Taking? Sig Documenting Provider Last Dose Status Informant   apixaban (ELIQUIS) 5 MG TABS tablet [id number]  Take 1 tablet (5 mg total) by mouth 2 (two) times daily. Iran Ouch, MD  Active   Calcium Carbonate-Vitamin D (CALCIUM-VITAMIN D3 PO) [id number]  Take 1 tablet by mouth daily. Calcium 600 = D3 1000 [provider]  Active Self  celecoxib (CELEBREX) 200 MG capsule [id number]  TAKE 1 CAPSULE BY MOUTH TWICE DAILY Bacigalupo, Marzella Schlein, MD  Active   Cholecalciferol (VITAMIN D3) 50 MCG (2000 UT) capsule [id number]  Take by mouth in the morning and at bedtime. [provider]  Active   cycloSPORINE (RESTASIS) 0.05 % ophthalmic emulsion [id number]  Place 1 drop into both eyes 2 (two) times daily.  [provider]  Active Self  dorzolamide-timolol (COSOPT) 22.3-6.8 MG/ML ophthalmic solution [id number]  Place 1 drop into both eyes 2 (two) times daily.  [provider]  Active Self  estrogens, conjugated, (PREMARIN) 0.625 MG tablet [id number]  Take 1 tablet (0.625 mg total) by mouth daily. Take daily for 21 days then do not take for 7 days. Erasmo Downer, MD  Active   fenofibrate (TRICOR) 145 MG tablet [id number]  TAKE 1 TABLET BY MOUTH ONCE DAILY Creig Hines, NP  Active   furosemide (LASIX) 20 MG tablet [id number]  TAKE 1 TABLET BY MOUTH ONCE DAILY Iran Ouch, MD  Active   latanoprost (XALATAN) 0.005 % ophthalmic solution [id number]  Place 1 drop into both eyes daily.  [provider]  Active    Patient not taking:   Discontinued 02/10/23 1113 (Completed Course)   Magnesium 400 MG CAPS [id number]  Take 400 mg by mouth daily.  [provider]  Active Self  metoprolol succinate (TOPROL-XL) 25 MG 24 hr tablet [id number]  TAKE 1/2 TABLET BY MOUTH ONCE DAILY Iran Ouch, MD  Active   METRONIDAZOLE, TOPICAL, 0.75 % LOTN [id number]  Apply topically daily. Apply to face in AM [provider]  Active   Multiple Vitamin (MULTIVITAMIN) tablet [id number]  Take 1 tablet by mouth daily.  [provider]  Active Self   Patient not taking:   Discontinued 02/10/23 1113 (Completed Course)   NON FORMULARY [id number]  Place 12 sprays into both nostrils 6 (six) times daily. 250GU irrigant [provider]  Active   Omega-3 Fatty Acids (FISH OIL) 1200 MG CAPS [id number]  Take by mouth 2 (two) times daily. [provider]  Active   omeprazole (PRILOSEC) 40 MG capsule [id number]  Take 40 mg by mouth every morning.  [provider]  Active Self  pregabalin (LYRICA) 100 MG capsule [id number]  TAKE 1 CAPSULE BY MOUTH ONCE DAILY Erasmo Downer, MD  Active   SOOLANTRA 1 % CREA [id number]  Apply 1 application topically daily. Sometimes uses twice daily as needed for flare ups. [provider]  Active             SDOH:  (Social Determinants of Health) assessments and interventions performed: Yes  Medication Assistance: None required.  Patient affirms current coverage meets needs.  Medication Access: Within the past 30 days, how often has patient missed a dose of medication? None Is a pillbox or other method used to improve adherence? No  Factors that may affect medication adherence? no barriers identified Are meds synced by current pharmacy? No  Are meds delivered by current pharmacy? No  Does patient experience delays in picking up medications due to transportation concerns? No   [facility] Reviewed: Is patient disadvantaged to use [facility]?: Yes  Current Rx insurance plan: HTA  Name and location of Current pharmacy:  [facility] - [place], [place] - [address] [place] [address] Phone: [phone] Fax: [phone]  [facility] services reviewed with patient today?: Yes  Patient requests to  transfer care to [facility]?: No  Reason patient declined to change pharmacies: Disadvantaged due to insurance/mail order  Compliance/Adherence/Medication fill history: Care Gaps: Annual wellness  visit in last year? Yes Last completed 04/13/2022 Shingrix Vaccine Dtap Vaccine COVID-19 Vaccine   If Diabetic: Last eye exam / retinopathy screening:None IS Last diabetic foot exam:None ID Hemoglobin A1C Diabetic Kidney Evaluation eGFR Mesaurement  Star-Rating Drugs: None ID    Assessment/Plan  Atrial Fibrillation (Goal: prevent stroke and major bleeding) -Controlled -CHADSVASC: 6 -Current treatment: Rate control: Metoprolol XL 25 mg daily  Anticoagulation: Eliquis 5 mg twice daily  -Medications previously tried: NA -Counseled on risks of NSAIDs due to increased bleeding risk with anticoagulants; patient was concerned about stopping her meloxicam.  -Recommended to continue current medication  Hyperlipidemia: (LDL goal < 70) -Uncontrolled -Current treatment: Fenofibrate 145 mg daily  -Medications previously tried: NA  -Recommend stopping fenofibrate, starting rosuvastatin 20 mg daily. Will defer until lipid profile recheck.  -Recommended to continue current medication  Osteopenia (Goal Prevent fractures) -Uncontrolled -Last DEXA Scan: 06/10/22   T-Score femoral neck: -1.9  T-Score total hip: -1.2  T-Score lumbar spine: NA  T-Score forearm radius: -1.5  10-year probability of major osteoporotic fracture: 27.1%  10-year probability of hip fracture: 16.7% -Patient is a candidate for pharmacologic treatment due to T-Score -1.0 to -2.5 and 10-year risk of major osteoporotic fracture > 20% and T-Score -1.0 to -2.5 and 10-year risk of hip fracture > 3% -Current treatment  Vitamin D3 2000 units twice daily  Calcium +D3 daily  -Medications previously tried: NA  -Recommend [phone] units of vitamin D daily. Recommend 1200 mg of calcium daily from dietary and supplemental sources. -Patient is at very high risk of fracture given 10-year probability of major osteoporotic fracture is >20% and probability of hip fracture is >3%. We discussed various treatment options, she will  discuss with her PCP at her next follow-up.  -Recommended to continue current medication  Hot Flashes (Goal: Improve symptoms) -Not ideally controlled -Current treatment  Premarin   -Medications previously tried: NA  -Patient was told by Cardiology she should try to taper off Premarin due to long-term risks with the medication. Patient states she has done this previously in the past and would subsequently struggle with recurrence of symptoms.  -Recommended discussing estradiol cream at PCP follow-up to help facilitate taper.   Follow Up Plan: Telephone follow up appointment with care management team member scheduled for: 09/20/2023 at 11:00 AM   Angelena Sole, PharmD, Patsy [place], CPP  Clinical Pharmacist Practitioner  [facility] [phone]

## 2023-04-08 DIAGNOSIS — H401123 Primary open-angle glaucoma, left eye, severe stage: Secondary | ICD-10-CM | POA: Diagnosis not present

## 2023-04-08 DIAGNOSIS — H401111 Primary open-angle glaucoma, right eye, mild stage: Secondary | ICD-10-CM | POA: Diagnosis not present

## 2023-04-11 ENCOUNTER — Telehealth (INDEPENDENT_AMBULATORY_CARE_PROVIDER_SITE_OTHER): Payer: Self-pay | Admitting: Nurse Practitioner

## 2023-04-11 ENCOUNTER — Other Ambulatory Visit: Payer: Self-pay | Admitting: Cardiovascular Disease

## 2023-04-11 DIAGNOSIS — I482 Chronic atrial fibrillation, unspecified: Secondary | ICD-10-CM

## 2023-04-11 NOTE — Telephone Encounter (Signed)
LVM for pt on mobile phone TCB and schedule saline sclerotherapy with Sheppard Plumber, NP. VM did not pick up on home phone and unable to LM.

## 2023-04-11 NOTE — Telephone Encounter (Signed)
Prescription refill request for Eliquis received. Indication: AF Last office visit: 02/10/23  Terrilee Files MD Scr: 0.87 on 01/12/22  Epic Age: 82 Weight: 88.9kg  Past due for labs.  Requested lab be done at PCP on 04/18/23 with copy sent to Dr Kirke Corin.  Based on above findings Eliquis 5mg  twice daily is the appropriate dose.  Refill approved.

## 2023-04-11 NOTE — Telephone Encounter (Signed)
Please review

## 2023-04-15 ENCOUNTER — Encounter: Payer: PPO | Admitting: Family Medicine

## 2023-04-18 ENCOUNTER — Encounter: Payer: Self-pay | Admitting: Family Medicine

## 2023-04-18 ENCOUNTER — Ambulatory Visit (INDEPENDENT_AMBULATORY_CARE_PROVIDER_SITE_OTHER): Payer: PPO | Admitting: Family Medicine

## 2023-04-18 VITALS — BP 136/84 | HR 67 | Temp 98.0°F | Resp 16 | Ht 68.0 in | Wt 198.9 lb

## 2023-04-18 DIAGNOSIS — I152 Hypertension secondary to endocrine disorders: Secondary | ICD-10-CM | POA: Diagnosis not present

## 2023-04-18 DIAGNOSIS — N951 Menopausal and female climacteric states: Secondary | ICD-10-CM

## 2023-04-18 DIAGNOSIS — Z7901 Long term (current) use of anticoagulants: Secondary | ICD-10-CM

## 2023-04-18 DIAGNOSIS — E1169 Type 2 diabetes mellitus with other specified complication: Secondary | ICD-10-CM | POA: Diagnosis not present

## 2023-04-18 DIAGNOSIS — D6869 Other thrombophilia: Secondary | ICD-10-CM | POA: Diagnosis not present

## 2023-04-18 DIAGNOSIS — I482 Chronic atrial fibrillation, unspecified: Secondary | ICD-10-CM

## 2023-04-18 DIAGNOSIS — Z0000 Encounter for general adult medical examination without abnormal findings: Secondary | ICD-10-CM | POA: Diagnosis not present

## 2023-04-18 DIAGNOSIS — E785 Hyperlipidemia, unspecified: Secondary | ICD-10-CM | POA: Diagnosis not present

## 2023-04-18 DIAGNOSIS — E1159 Type 2 diabetes mellitus with other circulatory complications: Secondary | ICD-10-CM | POA: Diagnosis not present

## 2023-04-18 NOTE — Assessment & Plan Note (Signed)
Chronic and stable  In NSR today F/b Cardiology Recheck CMP and CBC

## 2023-04-18 NOTE — Assessment & Plan Note (Signed)
Reviewed last lipid panel Not currently on a statin Recheck FLP and CMP Discussed diet and exercise  

## 2023-04-18 NOTE — Assessment & Plan Note (Signed)
Chronic and stable Recheck A1c - well controlled on last check Diet controlled - no meds Complicated by HTN and HLD Discussed recommendations for vaccines, eye exam foot exam, uacr today Not on ACEi/ARB or Statin Discussed diet and exercise F/u in 6 months

## 2023-04-18 NOTE — Assessment & Plan Note (Signed)
Well controlled Continue current medications Recheck metabolic panel F/u in 6 months  

## 2023-04-18 NOTE — Progress Notes (Signed)
I,Sulibeya S Dimas,acting as a Neurosurgeon for Teresa Latch, MD.,have documented all relevant documentation on the behalf of Teresa Latch, MD,as directed by  Teresa Latch, MD while in the presence of Teresa Latch, MD.    Annual Wellness Visit     Patient: Teresa Harris, Female    DOB: [birth year], 82 y.o.   MRN: [id number] Visit Date: 04/18/2023  Today's Provider: [name], MD   Chief Complaint  Patient presents with   Medicare Wellness   Subjective    Teresa Harris is a 82 y.o. female who presents today for her Annual Wellness Visit. She reports consuming a general diet. The patient does not participate in regular exercise at present. She generally feels well. She reports sleeping fairly well. She does have additional problems to discuss today.   HPI   Medications: Outpatient Medications Prior to Visit  Medication Sig   apixaban (ELIQUIS) 5 MG TABS tablet TAKE 1 TABLET BY MOUTH TWICE DAILY   Calcium Carbonate-Vitamin D (CALCIUM-VITAMIN D3 PO) Take 1 tablet by mouth daily. Calcium 600 = D3 1000   celecoxib (CELEBREX) 200 MG capsule TAKE 1 CAPSULE BY MOUTH TWICE DAILY (Patient taking differently: Take 200 mg by mouth daily.)   Cholecalciferol (VITAMIN D3) 50 MCG (2000 UT) capsule Take 2,000 Units by mouth daily.   cycloSPORINE (RESTASIS) 0.05 % ophthalmic emulsion Place 1 drop into both eyes 2 (two) times daily.    dorzolamide-timolol (COSOPT) 22.3-6.8 MG/ML ophthalmic solution Place 1 drop into both eyes 2 (two) times daily.    estrogens, conjugated, (PREMARIN) 0.625 MG tablet Take 1 tablet (0.625 mg total) by mouth daily. Take daily for 21 days then do not take for 7 days. (Patient taking differently: Take 0.625 mg by mouth daily.)   fenofibrate (TRICOR) 145 MG tablet TAKE 1 TABLET BY MOUTH ONCE DAILY   furosemide (LASIX) 20 MG tablet TAKE 1 TABLET BY MOUTH ONCE DAILY   latanoprost (XALATAN) 0.005 % ophthalmic solution Place 1 drop into  both eyes daily.    Magnesium 400 MG CAPS Take 400 mg by mouth daily.    metoprolol succinate (TOPROL-XL) 25 MG 24 hr tablet TAKE 1/2 TABLET BY MOUTH ONCE DAILY   METRONIDAZOLE, TOPICAL, 0.75 % LOTN Apply topically daily. Apply to face in AM   Multiple Vitamin (MULTIVITAMIN) tablet Take 1 tablet by mouth daily.   NON FORMULARY Place 12 sprays into both nostrils 6 (six) times daily. 250GU irrigant   Omega-3 Fatty Acids (FISH OIL) 1200 MG CAPS Take by mouth 2 (two) times daily.   omeprazole (PRILOSEC) 40 MG capsule Take 40 mg by mouth every morning.    pregabalin (LYRICA) 100 MG capsule TAKE 1 CAPSULE BY MOUTH ONCE DAILY   SOOLANTRA 1 % CREA Apply 1 application topically daily. Sometimes uses twice daily as needed for flare ups.   No facility-administered medications prior to visit.    Allergies  Allergen Reactions   [name] [Moxifloxacin Hcl In Nacl] Other (See Comments)    Headache   Bimatoprost Other (See Comments)    Made my eyes red, and hurt   Brimonidine Other (See Comments)    Made my eyes red, and hurt Burned eyes.   Clindamycin/Lincomycin Diarrhea    Severe diarrhea   Eszopiclone    Mobic [Meloxicam] Other (See Comments)    headache   Propofol Other (See Comments)    Had difficulty waking up Difficult to wake up.   Travoprost Other (See Comments)    Unknown   Vicodin [  Hydrocodone-Acetaminophen] Nausea And Vomiting and Other (See Comments)    Sick on stomach   Cephalexin Nausea And Vomiting and Other (See Comments)    headache    Patient Care Team: Erasmo Downer, MD as PCP - General (Family Medicine) Iran Ouch, MD as PCP - Cardiology (Cardiology) [place] Ouch, MD as Consulting Physician (Cardiology) [place] Crane, MD as Consulting Physician (Ophthalmology) Merri Ray, MD as Referring Physician (Physical Medicine and Rehabilitation) Linus Salmons, MD (Otolaryngology) Gaspar Cola, [facility] as Pharmacist (Pharmacist)  Review of  Systems  HENT:  Positive for hearing loss, postnasal drip, rhinorrhea, sinus pressure and sinus pain.   Eyes:  Positive for photophobia, redness and visual disturbance.  Endocrine: Positive for heat intolerance.  Musculoskeletal:  Positive for neck pain and neck stiffness.  All other systems reviewed and are negative.   Last CBC Lab Results  Component Value Date   WBC 5.3 01/12/2022   HGB 15.6 01/12/2022   HCT 45.8 01/12/2022   MCV 94 01/12/2022   MCH 32.0 01/12/2022   RDW 12.1 01/12/2022   PLT 162 01/12/2022   Last metabolic panel Lab Results  Component Value Date   GLUCOSE 92 01/12/2022   NA 140 01/12/2022   K 4.5 01/12/2022   CL 101 01/12/2022   CO2 25 01/12/2022   BUN 20 01/12/2022   CREATININE 0.87 01/12/2022   EGFR 67 01/12/2022   CALCIUM 9.2 01/12/2022   PROT 7.1 04/13/2022   ALBUMIN 4.2 04/13/2022   LABGLOB 2.9 04/03/2021   AGRATIO 1.5 04/03/2021   BILITOT 0.5 04/13/2022   ALKPHOS 50 04/13/2022   AST 25 04/13/2022   ALT 20 04/13/2022   ANIONGAP 10 07/17/2020   Last lipids Lab Results  Component Value Date   CHOL 193 04/13/2022   HDL 59 04/13/2022   LDLCALC 111 (H) 04/13/2022   TRIG 130 04/13/2022   CHOLHDL 3.3 04/13/2022   Last hemoglobin A1c Lab Results  Component Value Date   HGBA1C 6.0 (H) 04/13/2022   Last thyroid functions Lab Results  Component Value Date   TSH 1.901 12/07/2018   Last vitamin D Lab Results  Component Value Date   VD25OH 67.6 04/03/2021        Objective    Vitals: BP 136/84 (BP Location: Left Arm, Patient Position: Sitting, Cuff Size: Large)   Pulse 67   Temp 98 F (36.7 C) (Temporal)   Resp 16   Ht 5\' 8"  (1.727 m)   Wt 198 lb 14.4 oz (90.2 kg)   SpO2 98%   BMI 30.24 kg/m  BP Readings from Last 3 Encounters:  04/18/23 136/84  02/10/23 134/88  01/10/23 139/82   Wt Readings from Last 3 Encounters:  04/18/23 198 lb 14.4 oz (90.2 kg)  02/10/23 196 lb (88.9 kg)  01/10/23 195 lb 12.8 oz (88.8 kg)       Physical Exam Vitals reviewed.  Constitutional:      General: She is not in acute distress.    Appearance: Normal appearance. She is well-developed. She is not diaphoretic.  HENT:     Head: Normocephalic and atraumatic.     Right Ear: External ear normal.     Left Ear: External ear normal.     Ears:     Comments: Hearing aids in place    Nose: Nose normal.     Mouth/Throat:     Mouth: Mucous membranes are moist.     Pharynx: Oropharynx is clear. No oropharyngeal exudate.  Eyes:  General: No scleral icterus.    Conjunctiva/sclera: Conjunctivae normal.     Pupils: Pupils are equal, round, and reactive to light.  Neck:     Thyroid: No thyromegaly.  Cardiovascular:     Rate and Rhythm: Normal rate and regular rhythm.     Pulses: Normal pulses.     Heart sounds: Normal heart sounds. No murmur heard. Pulmonary:     Effort: Pulmonary effort is normal. No respiratory distress.     Breath sounds: Normal breath sounds. No wheezing or rales.  Abdominal:     General: There is no distension.     Palpations: Abdomen is soft.     Tenderness: There is no abdominal tenderness.  Musculoskeletal:        General: No deformity.     Cervical back: Neck supple.     Right lower leg: No edema.     Left lower leg: No edema.  Lymphadenopathy:     Cervical: No cervical adenopathy.  Skin:    General: Skin is warm and dry.     Findings: No rash.  Neurological:     Mental Status: She is alert and oriented to person, place, and time. Mental status is at baseline.     Gait: Gait normal.  Psychiatric:        Mood and Affect: Mood normal.        Behavior: Behavior normal.        Thought Content: Thought content normal.      Most recent functional status assessment:     04/18/2023   10:55 AM  In your present state of health, do you have any difficulty performing the following activities:  Hearing? 1  Vision? 1  Difficulty concentrating or making decisions? 0  Walking or climbing  stairs? 1  Dressing or bathing? 0  Doing errands, shopping? 0   Most recent fall risk assessment:    04/18/2023   10:55 AM  Fall Risk   Falls in the past year? 0  Number falls in past yr: 0  Injury with Fall? 0  Risk for fall due to : No Fall Risks  Follow up Falls evaluation completed    Most recent depression screenings:    04/18/2023   10:55 AM 04/13/2022   10:32 AM  PHQ 2/9 Scores  PHQ - 2 Score 0 0  PHQ- 9 Score 2 0   Most recent cognitive screening:    04/13/2022   10:27 AM  6CIT Screen  What Year? 0 points  What month? 0 points  What time? 0 points  Count back from 20 0 points  Months in reverse 0 points  Repeat phrase 0 points  Total Score 0 points   Most recent Audit-C alcohol use screening    04/18/2023   10:55 AM  Alcohol Use Disorder Test (AUDIT)  1. How often do you have a drink containing alcohol? 1  2. How many drinks containing alcohol do you have on a typical day when you are drinking? 0  3. How often do you have six or more drinks on one occasion? 0  AUDIT-C Score 1   A score of 3 or more in women, and 4 or more in men indicates increased risk for alcohol abuse, EXCEPT if all of the points are from question 1   No results found for any visits on 04/18/23.  Assessment & Plan     Annual wellness visit done today including the all of the following: Reviewed patient's Family Medical  History Reviewed and updated list of patient's medical providers Assessment of cognitive impairment was done Assessed patient's functional ability Established a written schedule for health screening services Health Risk Assessent Completed and Reviewed  Exercise Activities and Dietary recommendations  Goals      DIET - INCREASE WATER INTAKE     Recommend increasing water intake to 6-8 glasses a day.      DIET - REDUCE CALORIE INTAKE     Recommend to continue current diet plan of cutting down on calories and carbohydrates to help aid in weight loss.          Immunization History  Administered Date(s) Administered   Fluad Quad(high Dose 65+) 08/29/2019, 10/08/2020, 09/30/2021, 09/22/2022   Influenza, High Dose Seasonal PF 08/26/2016, 09/07/2017, 08/17/2018   Influenza,inj,Quad PF,6+ Mos 09/09/2014   Influenza-Unspecified 10/08/2015   PFIZER(Purple Top)SARS-COV-2 Vaccination 01/02/2020, 01/23/2020, 09/22/2020   Pneumococcal Conjugate-13 11/11/2014   Pneumococcal Polysaccharide-23 10/27/2012   Tdap 12/03/2010    Health Maintenance  Topic Date Due   FOOT EXAM  Never done   OPHTHALMOLOGY EXAM  Never done   Zoster Vaccines- Shingrix (1 of 2) Never done   DTaP/Tdap/Td (2 - Td or Tdap) 12/03/2020   COVID-19 Vaccine (4 - 2023-24 season) 08/06/2022   HEMOGLOBIN A1C  10/14/2022   Diabetic kidney evaluation - eGFR measurement  01/12/2023   Diabetic kidney evaluation - Urine ACR  04/14/2023   INFLUENZA VACCINE  07/07/2023   Medicare Annual Wellness (AWV)  04/17/2024   DEXA SCAN  06/11/2027   Pneumonia Vaccine 97+ Years old  Completed   HPV VACCINES  Aged Out     Discussed health benefits of physical activity, and encouraged her to engage in regular exercise appropriate for her age and condition.    Problem List Items Addressed This Visit       Cardiovascular and Mediastinum   Hypertension associated with diabetes (HCC)    Well controlled Continue current medications Recheck metabolic panel F/u in 6 months       Relevant Orders   Comprehensive metabolic panel   Atrial fibrillation, chronic (HCC)    Chronic and stable  In NSR today F/b Cardiology Recheck CMP and CBC      Relevant Orders   CBC   Comprehensive metabolic panel     Endocrine   Hyperlipidemia associated with type 2 diabetes mellitus (HCC)    Reviewed last lipid panel Not currently on a statin Recheck FLP and CMP Discussed diet and exercise       Relevant Orders   Comprehensive metabolic panel   Lipid panel   T2DM (type 2 diabetes mellitus) (HCC)     Chronic and stable Recheck A1c - well controlled on last check Diet controlled - no meds Complicated by HTN and HLD Discussed recommendations for vaccines, eye exam foot exam, uacr today Not on ACEi/ARB or Statin Discussed diet and exercise F/u in 6 months       Relevant Orders   Comprehensive metabolic panel   Lipid panel   Hemoglobin A1c   Urine Microalbumin w/creat. ratio     Hematopoietic and Hemostatic   Acquired thrombophilia (HCC)    Due to A fib      Relevant Orders   CBC   Comprehensive metabolic panel     Other   Post menopausal syndrome   Other Visit Diagnoses     Encounter for annual wellness visit (AWV) in Medicare patient    -  Primary   Encounter for annual physical  exam       Relevant Orders   CBC   Comprehensive metabolic panel   Lipid panel   Hemoglobin A1c   Urine Microalbumin w/creat. ratio   Current use of long term anticoagulation       Relevant Orders   CBC        Return in about 6 months (around 10/19/2023) for chronic disease f/u.     I, Teresa Latch, MD, have reviewed all documentation for this visit. The documentation on 04/18/23 for the exam, diagnosis, procedures, and orders are all accurate and complete.   Auria Mckinlay, Marzella Schlein, MD, MPH [facility]

## 2023-04-18 NOTE — Assessment & Plan Note (Signed)
Due to Afib 

## 2023-04-19 LAB — COMPREHENSIVE METABOLIC PANEL
ALT: 30 IU/L (ref 0–32)
AST: 29 IU/L (ref 0–40)
Albumin/Globulin Ratio: 1.6 (ref 1.2–2.2)
Albumin: 4.3 g/dL (ref 3.7–4.7)
Alkaline Phosphatase: 45 IU/L (ref 44–121)
BUN/Creatinine Ratio: 23 (ref 12–28)
BUN: 22 mg/dL (ref 8–27)
Bilirubin Total: 0.4 mg/dL (ref 0.0–1.2)
CO2: 25 mmol/L (ref 20–29)
Calcium: 9.9 mg/dL (ref 8.7–10.3)
Chloride: 107 mmol/L — ABNORMAL HIGH (ref 96–106)
Creatinine, Ser: 0.95 mg/dL (ref 0.57–1.00)
Globulin, Total: 2.7 g/dL (ref 1.5–4.5)
Glucose: 99 mg/dL (ref 70–99)
Potassium: 5.2 mmol/L (ref 3.5–5.2)
Sodium: 144 mmol/L (ref 134–144)
Total Protein: 7 g/dL (ref 6.0–8.5)
eGFR: 60 mL/min/{1.73_m2} (ref >59)

## 2023-04-19 LAB — LIPID PANEL
Chol/HDL Ratio: 3.5 ratio (ref 0.0–4.4)
Cholesterol, Total: 212 mg/dL — ABNORMAL HIGH (ref 100–199)
HDL: 61 mg/dL (ref >39)
LDL Chol Calc (NIH): 127 mg/dL — ABNORMAL HIGH (ref 0–99)
Triglycerides: 139 mg/dL (ref 0–149)
VLDL Cholesterol Cal: 24 mg/dL (ref 5–40)

## 2023-04-19 LAB — CBC
Hematocrit: 45.3 % (ref 34.0–46.6)
Hemoglobin: 15.1 g/dL (ref 11.1–15.9)
MCH: 31.7 pg (ref 26.6–33.0)
MCHC: 33.3 g/dL (ref 31.5–35.7)
MCV: 95 fL (ref 79–97)
Platelets: 162 10*3/uL (ref 150–450)
RBC: 4.76 x10E6/uL (ref 3.77–5.28)
RDW: 12.2 % (ref 11.7–15.4)
WBC: 4.8 10*3/uL (ref 3.4–10.8)

## 2023-04-19 LAB — HEMOGLOBIN A1C
Est. average glucose Bld gHb Est-mCnc: 128 mg/dL
Hgb A1c MFr Bld: 6.1 % — ABNORMAL HIGH (ref 4.8–5.6)

## 2023-04-19 LAB — MICROALBUMIN / CREATININE URINE RATIO
Creatinine, Urine: 75.2 mg/dL
Microalb/Creat Ratio: 61 mg/g creat — ABNORMAL HIGH (ref 0–29)
Microalbumin, Urine: 45.9 ug/mL

## 2023-04-25 ENCOUNTER — Telehealth: Payer: Self-pay | Admitting: Family Medicine

## 2023-04-26 ENCOUNTER — Encounter (INDEPENDENT_AMBULATORY_CARE_PROVIDER_SITE_OTHER): Payer: Self-pay | Admitting: Nurse Practitioner

## 2023-04-26 ENCOUNTER — Ambulatory Visit (INDEPENDENT_AMBULATORY_CARE_PROVIDER_SITE_OTHER): Payer: PPO | Admitting: Nurse Practitioner

## 2023-04-26 VITALS — BP 133/74 | HR 67 | Resp 18 | Ht 66.0 in | Wt 198.8 lb

## 2023-04-26 DIAGNOSIS — I83893 Varicose veins of bilateral lower extremities with other complications: Secondary | ICD-10-CM

## 2023-04-26 NOTE — Progress Notes (Signed)
Varicose veins of bilateral  lower extremity with inflammation (454.1  I83.10) Current Plans   Indication: Patient presents with symptomatic varicose veins of the bilateral  lower extremity.   Procedure: Sclerotherapy using hypertonic saline mixed with 1% Lidocaine was performed on the bilateral lower extremity. Compression wraps were placed. The patient tolerated the procedure well. 

## 2023-04-28 DIAGNOSIS — K115 Sialolithiasis: Secondary | ICD-10-CM | POA: Diagnosis not present

## 2023-04-28 DIAGNOSIS — K119 Disease of salivary gland, unspecified: Secondary | ICD-10-CM | POA: Diagnosis not present

## 2023-05-07 ENCOUNTER — Other Ambulatory Visit: Payer: Self-pay | Admitting: Family Medicine

## 2023-05-07 DIAGNOSIS — N951 Menopausal and female climacteric states: Secondary | ICD-10-CM

## 2023-05-12 DIAGNOSIS — K115 Sialolithiasis: Secondary | ICD-10-CM | POA: Diagnosis not present

## 2023-05-16 ENCOUNTER — Other Ambulatory Visit: Payer: Self-pay | Admitting: Family Medicine

## 2023-05-16 DIAGNOSIS — G2581 Restless legs syndrome: Secondary | ICD-10-CM

## 2023-05-16 DIAGNOSIS — G629 Polyneuropathy, unspecified: Secondary | ICD-10-CM

## 2023-05-31 ENCOUNTER — Encounter (INDEPENDENT_AMBULATORY_CARE_PROVIDER_SITE_OTHER): Payer: Self-pay | Admitting: Nurse Practitioner

## 2023-05-31 ENCOUNTER — Ambulatory Visit: Payer: Self-pay

## 2023-05-31 ENCOUNTER — Ambulatory Visit (INDEPENDENT_AMBULATORY_CARE_PROVIDER_SITE_OTHER): Payer: PPO | Admitting: Nurse Practitioner

## 2023-05-31 VITALS — BP 132/79 | HR 97 | Resp 16 | Wt 200.8 lb

## 2023-05-31 DIAGNOSIS — I83893 Varicose veins of bilateral lower extremities with other complications: Secondary | ICD-10-CM

## 2023-05-31 NOTE — Patient Outreach (Signed)
  Care Coordination   05/31/2023 Name: Teresa Harris MRN: [id number] DOB: Nov 04, 1941   Care Coordination Outreach Attempts:  An unsuccessful telephone outreach was attempted today to offer the patient information about available care coordination services.  Follow Up Plan:  Additional outreach attempts will be made to offer the patient care coordination information and services.   Encounter Outcome:  No Answer   Care Coordination Interventions:  No, not indicated    SIG Lysle Morales, BSW Social Worker [facility]  [phone]

## 2023-05-31 NOTE — Progress Notes (Signed)
Varicose veins of bilateral  lower extremity with inflammation (454.1  I83.10) Current Plans   Indication: Patient presents with symptomatic varicose veins of the bilateral  lower extremity.   Procedure: Sclerotherapy using hypertonic saline mixed with 1% Lidocaine was performed on the bilateral lower extremity. Compression wraps were placed. The patient tolerated the procedure well. 

## 2023-06-15 ENCOUNTER — Telehealth: Payer: Self-pay | Admitting: Family Medicine

## 2023-06-15 ENCOUNTER — Telehealth: Payer: Self-pay

## 2023-06-15 DIAGNOSIS — Z23 Encounter for immunization: Secondary | ICD-10-CM

## 2023-06-15 NOTE — Telephone Encounter (Signed)
Pt given lab results per notes of 04/18/23 on Dr. Beryle Flock. Answered many questions Crestor and what it does and she wanted to know what specific numbers were. Pt stated that she will clal back with her decision on the Crestor.

## 2023-06-15 NOTE — Telephone Encounter (Signed)
Pt stated she discussed with Dr. B needing a tetanus shot. Stated pharmacy advised her they needed Rx from PCP.  Please send Rx to the pharmacy below.  [facility] [place], [place] - [address]  [address] [place] [address]  Phone: [phone] Fax: [phone]  Hours: Not open 24 hours    Please advice.

## 2023-06-16 MED ORDER — TETANUS-DIPHTH-ACELL PERTUSSIS 5-2.5-18.5 LF-MCG/0.5 IM SUSP
0.5000 mL | Freq: Once | INTRAMUSCULAR | 0 refills | Status: AC
Start: 2023-06-16 — End: 2023-06-16

## 2023-06-16 NOTE — Telephone Encounter (Signed)
Left detailed message for patient advising of prescription sent to pharmacy.

## 2023-06-21 ENCOUNTER — Ambulatory Visit (INDEPENDENT_AMBULATORY_CARE_PROVIDER_SITE_OTHER): Payer: PPO | Admitting: Nurse Practitioner

## 2023-06-21 ENCOUNTER — Encounter (INDEPENDENT_AMBULATORY_CARE_PROVIDER_SITE_OTHER): Payer: Self-pay | Admitting: Nurse Practitioner

## 2023-06-21 VITALS — BP 137/69 | HR 72 | Resp 16 | Wt 197.0 lb

## 2023-06-21 DIAGNOSIS — I83893 Varicose veins of bilateral lower extremities with other complications: Secondary | ICD-10-CM | POA: Diagnosis not present

## 2023-06-21 NOTE — Progress Notes (Signed)
Varicose veins of bilateral  lower extremity with inflammation (454.1  I83.10) Current Plans   Indication: Patient presents with symptomatic varicose veins of the bilateral  lower extremity.   Procedure: Sclerotherapy using hypertonic saline mixed with 1% Lidocaine was performed on the bilateral lower extremity. Compression wraps were placed. The patient tolerated the procedure well. 

## 2023-06-22 DIAGNOSIS — H6123 Impacted cerumen, bilateral: Secondary | ICD-10-CM | POA: Diagnosis not present

## 2023-06-22 DIAGNOSIS — H903 Sensorineural hearing loss, bilateral: Secondary | ICD-10-CM | POA: Diagnosis not present

## 2023-06-23 ENCOUNTER — Other Ambulatory Visit: Payer: Self-pay | Admitting: Cardiovascular Disease

## 2023-06-23 DIAGNOSIS — I482 Chronic atrial fibrillation, unspecified: Secondary | ICD-10-CM

## 2023-07-08 DIAGNOSIS — H401111 Primary open-angle glaucoma, right eye, mild stage: Secondary | ICD-10-CM | POA: Diagnosis not present

## 2023-07-09 ENCOUNTER — Other Ambulatory Visit: Payer: Self-pay | Admitting: Nurse Practitioner

## 2023-07-11 ENCOUNTER — Other Ambulatory Visit: Payer: Self-pay | Admitting: Cardiovascular Disease

## 2023-07-11 DIAGNOSIS — I482 Chronic atrial fibrillation, unspecified: Secondary | ICD-10-CM

## 2023-07-11 NOTE — Telephone Encounter (Signed)
Eliquis 5mg  refill request received. Patient is 82 years old, weight-89.4kg, Crea-0.95 on 04/18/23, Diagnosis-Afib, and last seen by Dr. Kirke Corin on 02/10/23. Dose is appropriate based on dosing criteria. Will send in refill to requested pharmacy.

## 2023-07-11 NOTE — Telephone Encounter (Signed)
Please review

## 2023-08-09 ENCOUNTER — Telehealth: Payer: Self-pay | Admitting: Family Medicine

## 2023-08-09 NOTE — Telephone Encounter (Addendum)
Pt wants Dr B to know she is going back on her  PREMARIN 0.625 MG tablet.  Pt had a refill on that Rx and will pick up today.

## 2023-08-09 NOTE — Telephone Encounter (Signed)
She should not resume.  Increased risk of blood clots on estrogen and on eliquis to prevent this. Please discontinue on her med list.

## 2023-08-10 NOTE — Telephone Encounter (Signed)
Attempted to reach pt, left VM to call back. 

## 2023-08-10 NOTE — Telephone Encounter (Signed)
Lmtcb. PEC please advise as per Dr. Leonard Schwartz note.

## 2023-08-16 ENCOUNTER — Encounter: Payer: Self-pay | Admitting: *Deleted

## 2023-08-16 ENCOUNTER — Other Ambulatory Visit: Payer: Self-pay | Admitting: Family Medicine

## 2023-08-16 DIAGNOSIS — G629 Polyneuropathy, unspecified: Secondary | ICD-10-CM

## 2023-08-16 DIAGNOSIS — G2581 Restless legs syndrome: Secondary | ICD-10-CM

## 2023-08-16 NOTE — Telephone Encounter (Signed)
Patient returned call after receiving VM last night. Patient reports she has been on vacation and did not see message left on "house phone' until last night. Patient reports she tried to stop premarin x 2 weeks and had "hot flashes" so bad she had to restart premarin medication  on 08/09/23. Reviewed message from Dr. Beryle Flock  from 08/09/23 that "she should not resume. Increased risk of blood clots on estrogen and on eliquis to prevent this . Please discontinue on her med list.". patient would like to know what else she can take or do for hot flashes if medication premarin not recommended. Please advise and patient would like a call back.

## 2023-08-16 NOTE — Telephone Encounter (Signed)
Scheduled patient for August 23, 2023 at 2:40 pm

## 2023-08-16 NOTE — Telephone Encounter (Signed)
Recommend visit to discuss options. Veozah may be a good option for her, but will want to talk it through with her.

## 2023-08-16 NOTE — Telephone Encounter (Signed)
This encounter was created in error - please disregard.

## 2023-08-23 ENCOUNTER — Telehealth: Payer: Self-pay | Admitting: Family Medicine

## 2023-08-23 ENCOUNTER — Encounter: Payer: Self-pay | Admitting: Family Medicine

## 2023-08-23 ENCOUNTER — Ambulatory Visit (INDEPENDENT_AMBULATORY_CARE_PROVIDER_SITE_OTHER): Payer: PPO | Admitting: Family Medicine

## 2023-08-23 VITALS — BP 134/81 | HR 83 | Ht 66.0 in | Wt 187.3 lb

## 2023-08-23 DIAGNOSIS — I482 Chronic atrial fibrillation, unspecified: Secondary | ICD-10-CM

## 2023-08-23 DIAGNOSIS — N951 Menopausal and female climacteric states: Secondary | ICD-10-CM

## 2023-08-23 DIAGNOSIS — Z23 Encounter for immunization: Secondary | ICD-10-CM | POA: Diagnosis not present

## 2023-08-23 DIAGNOSIS — Z89419 Acquired absence of unspecified great toe: Secondary | ICD-10-CM

## 2023-08-23 DIAGNOSIS — D6869 Other thrombophilia: Secondary | ICD-10-CM | POA: Diagnosis not present

## 2023-08-23 DIAGNOSIS — E1169 Type 2 diabetes mellitus with other specified complication: Secondary | ICD-10-CM

## 2023-08-23 DIAGNOSIS — E785 Hyperlipidemia, unspecified: Secondary | ICD-10-CM | POA: Diagnosis not present

## 2023-08-23 MED ORDER — VEOZAH 45 MG PO TABS: 45.0000 mg | ORAL_TABLET | Freq: Every day | ORAL | 5 refills | Status: DC

## 2023-08-23 NOTE — Assessment & Plan Note (Signed)
stable °

## 2023-08-23 NOTE — Telephone Encounter (Signed)
[facility] is requesting prior authorization Key: [id number] Name: Teresa Harris 45mg  Tablets

## 2023-08-23 NOTE — Assessment & Plan Note (Signed)
Patient has diet-controlled diabetes. Discussed the importance of limiting carbohydrate intake to manage blood sugar levels. -Continue current dietary management.

## 2023-08-23 NOTE — Assessment & Plan Note (Signed)
On Eliquis for stroke prevention. -Continue Eliquis as prescribed.

## 2023-08-23 NOTE — Progress Notes (Signed)
Acute Office Visit  Subjective:     Patient ID: Teresa Harris, female    DOB: 10/15/1941, 82 y.o.   MRN: [id number]  Chief Complaint  Patient presents with   Medical Management of Chronic Issues    Discuss medication for hot flashes, no new concerns today     HPI Discussed the use of AI scribe software for clinical note transcription with the patient, who gave verbal consent to proceed.  History of Present Illness   The patient, with a history of atrial fibrillation and diabetes, presents for discussion of hot flash management. She had previously been on estrogen therapy, but due to the increased risk of stroke associated with both atrial fibrillation and estrogen use, the patient was advised to discontinue the estrogen. She reports understanding the reasoning behind this decision.  The patient has been experiencing hot flashes and is seeking alternative treatment options. She expresses interest in trying Veozah, a non-hormonal medication specifically designed for hot flash management. She has concerns about potential side effects, but is reassured that current patients on Veozah have reported minimal side effects.  The patient also mentions a recent flu shot and a scheduled appointment with a cardiologist. She expresses concerns about the cost of medications and falling into the "donut hole" of Medicare coverage. She reports a history of high cholesterol and has been advised to consider Crestor, but is hesitant due to potential side effects.       ROS per HPI      Objective:    BP 134/81 (BP Location: Right Arm, Patient Position: Sitting, Cuff Size: Large)   Pulse 83   Ht 5\' 6"  (1.676 m)   Wt 187 lb 4.8 oz (85 kg)   SpO2 98%   BMI 30.23 kg/m    Physical Exam Vitals reviewed.  Constitutional:      General: She is not in acute distress.    Appearance: Normal appearance. She is well-developed. She is not diaphoretic.  HENT:     Head: Normocephalic and atraumatic.   Eyes:     General: No scleral icterus.    Conjunctiva/sclera: Conjunctivae normal.  Neck:     Thyroid: No thyromegaly.  Cardiovascular:     Rate and Rhythm: Normal rate and regular rhythm.     Heart sounds: Normal heart sounds. No murmur heard. Pulmonary:     Effort: Pulmonary effort is normal. No respiratory distress.     Breath sounds: Normal breath sounds. No wheezing, rhonchi or rales.  Musculoskeletal:     Cervical back: Neck supple.     Right lower leg: No edema.     Left lower leg: No edema.  Lymphadenopathy:     Cervical: No cervical adenopathy.  Skin:    General: Skin is warm and dry.     Findings: No rash.  Neurological:     Mental Status: She is alert and oriented to person, place, and time. Mental status is at baseline.  Psychiatric:        Mood and Affect: Mood normal.        Behavior: Behavior normal.     No results found for any visits on 08/23/23.      Assessment & Plan:   Problem List Items Addressed This Visit       Endocrine   Hyperlipidemia associated with type 2 diabetes mellitus (HCC)    Discussed potential use of Crestor, but patient has concerns about side effects. Given patient's age and lack of history of heart attack  or stroke, decision to hold off on Crestor for now. -Plan to recheck cholesterol levels at next appointment in November.      T2DM (type 2 diabetes mellitus) (HCC)    Patient has diet-controlled diabetes. Discussed the importance of limiting carbohydrate intake to manage blood sugar levels. -Continue current dietary management.        Hematopoietic and Hemostatic   Acquired thrombophilia (HCC)     Other   History of amputation of great toe (HCC)    stable      Other Visit Diagnoses     Hot flash, menopausal    -  Primary   Flu vaccine need       Relevant Orders   Flu Vaccine Trivalent High Dose (Fluad) (Completed)           Menopausal Hot Flashes Patient was previously on Premarin, but due to increased  risk of stroke with concurrent use of estrogen and Eliquis (for AFib), alternative non-hormonal options were discussed. -Start Veozah for hot flashes, monitor liver function. -Gradually decrease Premarin dose while waiting for Veozah.  General Health Maintenance -Flu shot administered. -Schedule follow-up appointment for November 15th, 2024.        Meds ordered this encounter  Medications   Fezolinetant (VEOZAH) 45 MG TABS    Sig: Take 1 tablet (45 mg total) by mouth daily.    Dispense:  30 tablet    Refill:  5    No follow-ups on file.  [name], MD

## 2023-08-23 NOTE — Assessment & Plan Note (Signed)
Discussed potential use of Crestor, but patient has concerns about side effects. Given patient's age and lack of history of heart attack or stroke, decision to hold off on Crestor for now. -Plan to recheck cholesterol levels at next appointment in November.

## 2023-08-24 ENCOUNTER — Telehealth: Payer: Self-pay

## 2023-08-24 NOTE — Telephone Encounter (Signed)
Copied from CRM [id number]. Topic: General - Other >> Aug 23, 2023  4:55 PM Everette C wrote: Reason for CRM: The patient has been notified by their pharmacy that their prescription for Fezolinetant (VEOZAH) 45 MG TABS [[id number]] will be more than $700 out of pocket   The patient would like to be contacted by a member of clinical to discuss alternatives   Please contact further when possible

## 2023-08-25 ENCOUNTER — Ambulatory Visit: Payer: Self-pay | Admitting: *Deleted

## 2023-08-25 NOTE — Telephone Encounter (Signed)
Reason for Disposition  Caller wants to use a complementary or alternative medicine  Answer Assessment - Initial Assessment Questions 1. NAME of MEDICINE: "What medicine(s) are you calling about?"     Zoloft 50 mg as an alternative for the $700 drug. 2. QUESTION: "What is your question?" (e.g., double dose of medicine, side effect)     She does not want to take Zoloft.   She is going to stop the Premarin altogether. 3. PRESCRIBER: "Who prescribed the medicine?" Reason: if prescribed by specialist, call should be referred to that group.     Dr. Beryle Flock 4. SYMPTOMS: "Do you have any symptoms?" If Yes, ask: "What symptoms are you having?"  "How bad are the symptoms (e.g., mild, moderate, severe)     N/A 5. PREGNANCY:  "Is there any chance that you are pregnant?" "When was your last menstrual period?"     N/A  Protocols used: Medication Question Call-A-AH  Chief Complaint: Message to Dr. Beryle Flock that she does not want to take the Zoloft 50 mg.   She is going to stop the Premarin all together.  She said she discussed the Zoloft with Dr. Beryle Flock in the office. Symptoms: N/A Frequency: N/A Pertinent Negatives: Patient denies N/A Disposition: [] ED /[] Urgent Care (no appt availability in office) / [] Appointment(In office/virtual)/ []  [place] [facility]/ [] Home Care/ [] Refused Recommended Disposition /[] [place] [facility]/ [x]  Follow-up with PCP Additional Notes: Message sent to Dr. Beryle Flock.

## 2023-08-25 NOTE — Telephone Encounter (Signed)
LMTCB-Ok for [facility] Nurse to give message to patient.

## 2023-08-25 NOTE — Telephone Encounter (Signed)
Alternative is zoloft 50mg  daily. We discussed this during her visit as an alternative. Ok to send #[phone] [phone] if patient agrees.

## 2023-08-27 NOTE — Telephone Encounter (Signed)
Reviewed patient call report and PCP notes/telephone recommendations   Recommend OV w/ PCP to discuss further recommendations since patient does not want to move forward with alternative recommendation

## 2023-08-29 NOTE — Telephone Encounter (Signed)
PA started

## 2023-08-30 NOTE — Telephone Encounter (Signed)
Called [facility] and pharmacist aware of approval

## 2023-08-30 NOTE — Telephone Encounter (Signed)
Outcome Approved on September 23 by [facility] [employer] 2017 23-SEP-24:31-DEC-24 Veozah 45MG  OR TABS Quantity:30; Drug Veozah 45MG  tablets

## 2023-09-08 ENCOUNTER — Other Ambulatory Visit: Payer: Self-pay | Admitting: Cardiovascular Disease

## 2023-09-08 DIAGNOSIS — I482 Chronic atrial fibrillation, unspecified: Secondary | ICD-10-CM

## 2023-09-09 NOTE — Telephone Encounter (Signed)
last visit 02/10/23 with plan to f/u in 6 months, please schedule.  Thanks

## 2023-09-22 ENCOUNTER — Telehealth: Payer: Self-pay

## 2023-09-22 NOTE — Telephone Encounter (Signed)
Copied from CRM [id number]. Topic: General - Inquiry >> Sep 22, 2023  4:29 PM Teresa Harris wrote: Reason for CRM: pt ask that Victorino Dike at the front desk call her back  CB@  [phone]

## 2023-09-23 NOTE — Telephone Encounter (Signed)
Left vm this morning for pt to call back.  Pt came by the office and appt was made.

## 2023-09-24 ENCOUNTER — Other Ambulatory Visit: Payer: Self-pay | Admitting: Family Medicine

## 2023-09-26 NOTE — Telephone Encounter (Signed)
Requested medication (s) are due for refill today: Yes  Requested medication (s) are on the active medication list: Yes  Last refill:  01/25/23  Future visit scheduled: Yes  Notes to clinic:  Manual review.    Requested Prescriptions  Pending Prescriptions Disp Refills   celecoxib (CELEBREX) 200 MG capsule [Pharmacy Med Name: CELECOXIB 200 MG CAP] 180 capsule 0    Sig: TAKE 1 CAPSULE BY MOUTH TWICE DAILY     Analgesics:  COX2 Inhibitors Failed - 09/24/2023  3:40 PM      Failed - Manual Review: Labs are only required if the patient has taken medication for more than 8 weeks.      Passed - HGB in normal range and within 360 days    Hemoglobin  Date Value Ref Range Status  04/18/2023 15.1 11.1 - 15.9 g/dL Final         Passed - Cr in normal range and within 360 days    Creatinine  Date Value Ref Range Status  12/29/2011 0.57 (L) 0.60 - 1.30 mg/dL Final   Creatinine, Ser  Date Value Ref Range Status  04/18/2023 0.95 0.57 - 1.00 mg/dL Final         Passed - HCT in normal range and within 360 days    Hematocrit  Date Value Ref Range Status  04/18/2023 45.3 34.0 - 46.6 % Final         Passed - AST in normal range and within 360 days    AST  Date Value Ref Range Status  04/18/2023 29 0 - 40 IU/L Final         Passed - ALT in normal range and within 360 days    ALT  Date Value Ref Range Status  04/18/2023 30 0 - 32 IU/L Final         Passed - eGFR is 30 or above and within 360 days    EGFR (African American)  Date Value Ref Range Status  12/29/2011 >60 >63mL/min Final   GFR calc Af Amer  Date Value Ref Range Status  11/26/2020 66 >59 mL/min/1.73 Final    Comment:    **In accordance with recommendations from the NKF-ASN Task force,**   [facility] is in the process of updating its eGFR calculation to the   2021 CKD-EPI creatinine equation that estimates kidney function   without a race variable.    EGFR (Non-African Amer.)  Date Value Ref Range Status   12/29/2011 >60 >61mL/min Final    Comment:    eGFR values <94mL/min/1.73 m2 may be an indication of chronic kidney disease (CKD). Calculated eGFR, using the MRDR Study equation, is useful in  patients with stable renal function. The eGFR calculation will not be reliable in acutely ill patients when serum creatinine is changing rapidly. It is not useful in patients on dialysis. The eGFR calculation may not be applicable to patients at the low and high extremes of body sizes, pregnant women, and vegetarians.    GFR calc non Af Amer  Date Value Ref Range Status  11/26/2020 57 (L) >59 mL/min/1.73 Final   eGFR  Date Value Ref Range Status  04/18/2023 60 >59 mL/min/1.73 Final         Passed - Patient is not pregnant      Passed - Valid encounter within last 12 months    Recent Outpatient Visits           1 month ago Hot flash, menopausal   [facility] [place]  [facility] Bacigalupo, Marzella Schlein, MD   5 months ago Encounter for annual wellness visit (AWV) in Medicare patient   [facility] [place], Marzella Schlein, MD   1 year ago Encounter for annual wellness visit (AWV) in Medicare patient   [facility] [place], Marzella Schlein, MD   1 year ago Upper respiratory tract infection, unspecified type   [facility] [place], Oswaldo Conroy, PA-C   2 years ago Encounter for subsequent annual wellness visit (AWV) in Medicare patient   [facility] [place], Marzella Schlein, MD       Future Appointments             In 4 weeks Bacigalupo, Marzella Schlein, MD [facility]

## 2023-10-07 ENCOUNTER — Other Ambulatory Visit: Payer: Self-pay | Admitting: Nurse Practitioner

## 2023-10-10 NOTE — Telephone Encounter (Signed)
Please contact pt for future appointment. Pt due for 6 month f/u. 

## 2023-10-11 ENCOUNTER — Telehealth: Payer: Self-pay | Admitting: Cardiovascular Disease

## 2023-10-11 ENCOUNTER — Other Ambulatory Visit: Payer: Self-pay

## 2023-10-11 MED ORDER — FENOFIBRATE 145 MG PO TABS: ORAL_TABLET | ORAL | 0 refills | Status: DC

## 2023-10-11 NOTE — Telephone Encounter (Signed)
RX sent, patient should make 6 month follow up appointment.

## 2023-10-11 NOTE — Telephone Encounter (Signed)
*  STAT* If patient is at the pharmacy, call can be transferred to refill team.   1. Which medications need to be refilled? (please list name of each medication and dose if known) fenofibrate (TRICOR) 145 MG tablet   2. Which pharmacy/location (including street and city if local pharmacy) is medication to be sent to?  [facility] [place], [place] - [address]    3. Do they need a 30 day or 90 day supply? 90

## 2023-10-13 DIAGNOSIS — L718 Other rosacea: Secondary | ICD-10-CM | POA: Diagnosis not present

## 2023-10-13 DIAGNOSIS — Z86018 Personal history of other benign neoplasm: Secondary | ICD-10-CM | POA: Diagnosis not present

## 2023-10-13 DIAGNOSIS — Z872 Personal history of diseases of the skin and subcutaneous tissue: Secondary | ICD-10-CM | POA: Diagnosis not present

## 2023-10-13 DIAGNOSIS — L918 Other hypertrophic disorders of the skin: Secondary | ICD-10-CM | POA: Diagnosis not present

## 2023-10-13 DIAGNOSIS — L570 Actinic keratosis: Secondary | ICD-10-CM | POA: Diagnosis not present

## 2023-10-13 DIAGNOSIS — L578 Other skin changes due to chronic exposure to nonionizing radiation: Secondary | ICD-10-CM | POA: Diagnosis not present

## 2023-10-21 ENCOUNTER — Ambulatory Visit: Payer: PPO | Admitting: Family Medicine

## 2023-10-21 NOTE — Telephone Encounter (Signed)
Appointment Dec 20th 2024

## 2023-10-24 ENCOUNTER — Ambulatory Visit: Payer: PPO | Admitting: Family Medicine

## 2023-10-28 DIAGNOSIS — H401123 Primary open-angle glaucoma, left eye, severe stage: Secondary | ICD-10-CM | POA: Diagnosis not present

## 2023-11-10 ENCOUNTER — Other Ambulatory Visit: Payer: Self-pay | Admitting: Family Medicine

## 2023-11-10 DIAGNOSIS — G629 Polyneuropathy, unspecified: Secondary | ICD-10-CM

## 2023-11-10 DIAGNOSIS — G2581 Restless legs syndrome: Secondary | ICD-10-CM

## 2023-11-14 ENCOUNTER — Encounter: Payer: Self-pay | Admitting: Family Medicine

## 2023-11-14 ENCOUNTER — Ambulatory Visit (INDEPENDENT_AMBULATORY_CARE_PROVIDER_SITE_OTHER): Payer: PPO | Admitting: Family Medicine

## 2023-11-14 VITALS — BP 127/81 | HR 75 | Resp 16 | Ht 68.0 in | Wt 197.7 lb

## 2023-11-14 DIAGNOSIS — I152 Hypertension secondary to endocrine disorders: Secondary | ICD-10-CM | POA: Diagnosis not present

## 2023-11-14 DIAGNOSIS — E1142 Type 2 diabetes mellitus with diabetic polyneuropathy: Secondary | ICD-10-CM

## 2023-11-14 DIAGNOSIS — I482 Chronic atrial fibrillation, unspecified: Secondary | ICD-10-CM | POA: Diagnosis not present

## 2023-11-14 DIAGNOSIS — E1159 Type 2 diabetes mellitus with other circulatory complications: Secondary | ICD-10-CM | POA: Diagnosis not present

## 2023-11-14 DIAGNOSIS — E785 Hyperlipidemia, unspecified: Secondary | ICD-10-CM | POA: Diagnosis not present

## 2023-11-14 DIAGNOSIS — E1169 Type 2 diabetes mellitus with other specified complication: Secondary | ICD-10-CM | POA: Diagnosis not present

## 2023-11-14 NOTE — Assessment & Plan Note (Signed)
Patient is on fenofibrate for cholesterol management. Discussed the role of carbohydrate intake in managing triglyceride levels and the importance of dietary modifications in conjunction with medication. - Continue fenofibrate - Recheck lipid panel as part of labs today

## 2023-11-14 NOTE — Assessment & Plan Note (Signed)
Well-controlled hypertension. Blood pressure was noted to be in good shape during the visit. - Continue current antihypertensive regimen

## 2023-11-14 NOTE — Assessment & Plan Note (Signed)
Symptoms managed with Lyrica 100 mg once daily at night. Discussed potential for increasing the dose if symptoms worsen and emphasized symptom monitoring. - Continue Lyrica 100 mg once daily at night - Monitor symptoms and consider increasing dose if neuropathy worsens

## 2023-11-14 NOTE — Assessment & Plan Note (Signed)
Patient is on Eliquis 5 mg BID for anticoagulation and metoprolol for heart rate control. No new symptoms reported. Discussed potential drowsiness side effect of Lyrica and its timing to mitigate this effect. - Continue Eliquis 5 mg BID - Continue metoprolol - Recheck blood counts as part of labs today due to anticoagulation therapy

## 2023-11-14 NOTE — Progress Notes (Signed)
Established Patient Office Visit  Subjective   Patient ID: Teresa Harris, female    DOB: June 15, 1941  Age: 82 y.o. MRN: [id number]  Chief Complaint  Patient presents with   Medical Management of Chronic Issues    HPI  Discussed the use of AI scribe software for clinical note transcription with the patient, who gave verbal consent to proceed.  History of Present Illness   The patient, an 82 year old female with a history of type two diabetes, hypertension, hyperlipidemia, and chronic AFib, presents for a six-month chronic disease follow-up. She is currently on Eliquis 5mg  BID for AFib, Metoprolol XL 12.5mg  daily, Lasix 20mg  daily, and Fenofibrate for cholesterol. Her diabetes is currently diet-controlled.  The patient reports worsening neuropathy in her feet. She is currently on Lyrica 100mg  once daily for neuropathy, which she takes at 8:30 pm every night. She reports that the medication is working for now, but she anticipates needing an increased dosage in the future.  The patient also discusses her diet, admitting to frequently eating sandwiches and drinking Pepsi, particularly when dining out. She lives alone and finds it challenging to cook for one person. Despite her diet, her A1c has been well-controlled.  The patient also mentions that she has glaucoma and sees an eye doctor regularly. She recently stopped taking Premarin for hot flashes and has been managing without it.         ROS    Objective:     BP 127/81 (BP Location: Left Arm, Patient Position: Sitting, Cuff Size: Large)   Pulse 75   Resp 16   Ht 5\' 8"  (1.727 m)   Wt 197 lb 11.2 oz (89.7 kg)   SpO2 98%   BMI 30.06 kg/m    Physical Exam Vitals reviewed.  Constitutional:      General: She is not in acute distress.    Appearance: Normal appearance. She is well-developed. She is not diaphoretic.  HENT:     Head: Normocephalic and atraumatic.  Eyes:     General: No scleral icterus.     Conjunctiva/sclera: Conjunctivae normal.  Neck:     Thyroid: No thyromegaly.  Cardiovascular:     Rate and Rhythm: Normal rate and regular rhythm.     Heart sounds: Normal heart sounds. No murmur heard. Pulmonary:     Effort: Pulmonary effort is normal. No respiratory distress.     Breath sounds: Normal breath sounds. No wheezing, rhonchi or rales.  Musculoskeletal:     Cervical back: Neck supple.     Right lower leg: No edema.     Left lower leg: No edema.  Lymphadenopathy:     Cervical: No cervical adenopathy.  Skin:    General: Skin is warm and dry.     Findings: No rash.  Neurological:     Mental Status: She is alert and oriented to person, place, and time. Mental status is at baseline.  Psychiatric:        Mood and Affect: Mood normal.        Behavior: Behavior normal.      No results found for any visits on 11/14/23.    The ASCVD Risk score (Arnett DK, et al., 2019) failed to calculate for the following reasons:   The 2019 ASCVD risk score is only valid for ages 78 to 52    Assessment & Plan:   Problem List Items Addressed This Visit       Cardiovascular and Mediastinum   Hypertension associated with diabetes (HCC)  Well-controlled hypertension. Blood pressure was noted to be in good shape during the visit. - Continue current antihypertensive regimen      Relevant Orders   Comprehensive metabolic panel   Atrial fibrillation, chronic (HCC)    Patient is on Eliquis 5 mg BID for anticoagulation and metoprolol for heart rate control. No new symptoms reported. Discussed potential drowsiness side effect of Lyrica and its timing to mitigate this effect. - Continue Eliquis 5 mg BID - Continue metoprolol - Recheck blood counts as part of labs today due to anticoagulation therapy      Relevant Orders   CBC     Endocrine   Hyperlipidemia associated with type 2 diabetes mellitus (HCC)    Patient is on fenofibrate for cholesterol management. Discussed the role  of carbohydrate intake in managing triglyceride levels and the importance of dietary modifications in conjunction with medication. - Continue fenofibrate - Recheck lipid panel as part of labs today      Relevant Orders   Comprehensive metabolic panel   Lipid panel   Diabetic peripheral neuropathy (HCC)    Symptoms managed with Lyrica 100 mg once daily at night. Discussed potential for increasing the dose if symptoms worsen and emphasized symptom monitoring. - Continue Lyrica 100 mg once daily at night - Monitor symptoms and consider increasing dose if neuropathy worsens      T2DM (type 2 diabetes mellitus) (HCC) - Primary    Diet-controlled diabetes with well-controlled A1c. Discussed dietary modifications to maintain blood sugar levels, focusing on reducing carbohydrate intake and substituting high-carb foods with lower-carb options. - Recheck A1c as part of labs today - Provide simplified handout on a lower-carb diet - Encourage substituting fries or chips with side salads or vegetables when dining out      Relevant Orders   Microalbumin / creatinine urine ratio   Hemoglobin A1c        General Health Maintenance Routine health maintenance discussed, including eye exams and lab tests. Patient has glaucoma and is under regular ophthalmologic care. Discussed the importance of regular monitoring and maintaining current treatment regimen. - Perform routine lab tests including kidney and liver function, cholesterol, blood counts, and A1c - Send urine sample for microalbumin test - Obtain records from ophthalmologist Sharyl Nimrod [place])  Follow-up - Schedule physical in six months - Send for lab tests across the hall.        Return in about 6 months (around 05/14/2024) for CPE.    [name], MD

## 2023-11-14 NOTE — Assessment & Plan Note (Signed)
Diet-controlled diabetes with well-controlled A1c. Discussed dietary modifications to maintain blood sugar levels, focusing on reducing carbohydrate intake and substituting high-carb foods with lower-carb options. - Recheck A1c as part of labs today - Provide simplified handout on a lower-carb diet - Encourage substituting fries or chips with side salads or vegetables when dining out

## 2023-11-15 LAB — MICROALBUMIN / CREATININE URINE RATIO
Creatinine, Urine: 15.4 mg/dL
Microalb/Creat Ratio: 31 mg/g{creat} — ABNORMAL HIGH (ref 0–29)
Microalbumin, Urine: 4.7 ug/mL

## 2023-11-15 LAB — SPECIMEN STATUS REPORT

## 2023-11-18 DIAGNOSIS — E1169 Type 2 diabetes mellitus with other specified complication: Secondary | ICD-10-CM | POA: Diagnosis not present

## 2023-11-18 DIAGNOSIS — E785 Hyperlipidemia, unspecified: Secondary | ICD-10-CM | POA: Diagnosis not present

## 2023-11-18 DIAGNOSIS — E1159 Type 2 diabetes mellitus with other circulatory complications: Secondary | ICD-10-CM | POA: Diagnosis not present

## 2023-11-18 DIAGNOSIS — H401133 Primary open-angle glaucoma, bilateral, severe stage: Secondary | ICD-10-CM | POA: Diagnosis not present

## 2023-11-18 DIAGNOSIS — I152 Hypertension secondary to endocrine disorders: Secondary | ICD-10-CM | POA: Diagnosis not present

## 2023-11-18 DIAGNOSIS — H401111 Primary open-angle glaucoma, right eye, mild stage: Secondary | ICD-10-CM | POA: Diagnosis not present

## 2023-11-18 DIAGNOSIS — H401123 Primary open-angle glaucoma, left eye, severe stage: Secondary | ICD-10-CM | POA: Diagnosis not present

## 2023-11-18 DIAGNOSIS — I482 Chronic atrial fibrillation, unspecified: Secondary | ICD-10-CM | POA: Diagnosis not present

## 2023-11-19 LAB — LIPID PANEL
Chol/HDL Ratio: 4.2 {ratio} (ref 0.0–4.4)
Cholesterol, Total: 212 mg/dL — ABNORMAL HIGH (ref 100–199)
HDL: 50 mg/dL (ref >39)
LDL Chol Calc (NIH): 132 mg/dL — ABNORMAL HIGH (ref 0–99)
Triglycerides: 166 mg/dL — ABNORMAL HIGH (ref 0–149)
VLDL Cholesterol Cal: 30 mg/dL (ref 5–40)

## 2023-11-19 LAB — COMPREHENSIVE METABOLIC PANEL
ALT: 26 [IU]/L (ref 0–32)
AST: 26 [IU]/L (ref 0–40)
Albumin: 4.2 g/dL (ref 3.7–4.7)
Alkaline Phosphatase: 48 [IU]/L (ref 44–121)
BUN/Creatinine Ratio: 19 (ref 12–28)
BUN: 23 mg/dL (ref 8–27)
Bilirubin Total: 0.6 mg/dL (ref 0.0–1.2)
CO2: 27 mmol/L (ref 20–29)
Calcium: 9.7 mg/dL (ref 8.7–10.3)
Chloride: 102 mmol/L (ref 96–106)
Creatinine, Ser: 1.19 mg/dL — ABNORMAL HIGH (ref 0.57–1.00)
Globulin, Total: 2.6 g/dL (ref 1.5–4.5)
Glucose: 115 mg/dL — ABNORMAL HIGH (ref 70–99)
Potassium: 4.2 mmol/L (ref 3.5–5.2)
Sodium: 143 mmol/L (ref 134–144)
Total Protein: 6.8 g/dL (ref 6.0–8.5)
eGFR: 46 mL/min/{1.73_m2} — ABNORMAL LOW (ref >59)

## 2023-11-19 LAB — CBC
Hematocrit: 46.3 % (ref 34.0–46.6)
Hemoglobin: 15.1 g/dL (ref 11.1–15.9)
MCH: 32.3 pg (ref 26.6–33.0)
MCHC: 32.6 g/dL (ref 31.5–35.7)
MCV: 99 fL — ABNORMAL HIGH (ref 79–97)
Platelets: 149 10*3/uL — ABNORMAL LOW (ref 150–450)
RBC: 4.68 x10E6/uL (ref 3.77–5.28)
RDW: 12 % (ref 11.7–15.4)
WBC: 4.1 10*3/uL (ref 3.4–10.8)

## 2023-11-19 LAB — HEMOGLOBIN A1C
Est. average glucose Bld gHb Est-mCnc: 128 mg/dL
Hgb A1c MFr Bld: 6.1 % — ABNORMAL HIGH (ref 4.8–5.6)

## 2023-11-21 ENCOUNTER — Telehealth: Payer: Self-pay

## 2023-11-21 NOTE — Telephone Encounter (Signed)
Pt told CT tech that she is allergic to iodine. Tech is calling to get verbal order to change CT chest w/contrast to CT chest without contrast. I notified Dr Melvyn Neth and he is in agreement.

## 2023-11-23 NOTE — Progress Notes (Signed)
Cardiology Clinic Note   Date: 11/25/2023 ID: Teresa Harris, DOB 1941-02-05, MRN [id number]  Primary Cardiologist:  Lorine Bears, MD  Patient Profile    Teresa Harris is a 82 y.o. female who presents to the clinic today for routine follow up.     Past medical history significant for: Chronic diastolic heart failure. Echo 12/28/2018: EF 60 to 65%.  No RWMA.  Mild MR.  Mild LAE.  Mildly elevated PA pressure. Permanent A-fib. 48-hour Holter 09/02/2015: A-fib.  Average HR 83 bpm.  Up to 2-second pauses noted during early morning hours.  Episodes of A-fib with RVR response with max heart rate 152 bpm. Hypertension. Hyperlipidemia. Lipid panel 11/18/2023: LDL 132, HDL 50, TG 166, total 212. T2DM. Varicose veins.  In summary, stress testing November 2015 was nonischemic.  Echo January 2020 showed normal LV function (further details above).  She was started on a small dose of Lasix secondary to mild pulmonary hypertension.  Patient underwent left great toe amputation secondary to osteomyelitis in February 2023.       History of Present Illness    Teresa Harris is followed by Dr. Kirke Corin for the above outlined history.  Patient was last seen in the office by Dr. Kirke Corin on 02/10/2023 for routine follow-up.  She was doing well at that time and no medication changes were made.  Today, patient reports she is more dyspneic in the past several months. She has noticed increased dyspnea with heavier exertion and activities that require her to bend at the waist. She denies lower extremity edema, orthopnea, PND, abdominal bloating/fullness. She admits to dietary indiscretion with sodium eating a lot of sandwiches. She does not have a scale to weigh at home. She reports slow, steady weight gain over 6 years without any big increases. She is able to perform light to moderate household activities with no dyspnea. She does not exercise regularly. It is difficult for her to walk outside  her home secondary to it being hilly and having a gravel driveway. She is active within the community helping at a couple of different churches and other organizations. No chest pain, pressure, or tightness. No palpitations usually. She does not typically have awareness of arrhythmia. Sometimes at night when she gets back into bed from using the bathroom her heart will "feel funny."       ROS: All other systems reviewed and are otherwise negative except as noted in History of Present Illness.  Studies Reviewed    EKG Interpretation Date/Time:  Friday November 25 2023 13:59:51 EST Ventricular Rate:  70 PR Interval:    QRS Duration:  80 QT Interval:  394 QTC Calculation: 425 R Axis:   -11  Text Interpretation: Atrial fibrillation Nonspecific ST and T wave abnormality When compared with ECG of 02/10/2023 (not in Muse) no significant change Confirmed by Carlos Levering [phone]) on 11/25/2023 2:03:51 PM   Risk Assessment/Calculations     CHA2DS2-VASc Score = 6   This indicates a 9.7% annual risk of stroke. The patient's score is based upon: CHF History: 1 HTN History: 1 Diabetes History: 1 Stroke History: 0 Vascular Disease History: 0 Age Score: 2 Gender Score: 1             Physical Exam    VS:  BP 110/70 (BP Location: Left Arm, Patient Position: Sitting, Cuff Size: Normal)   Pulse 70   Ht 5\' 8"  (1.727 m)   Wt 196 lb 8 oz (89.1 kg)   SpO2 96%  BMI 29.88 kg/m  , BMI Body mass index is 29.88 kg/m.  GEN: Well nourished, well developed, in no acute distress. Neck: No JVD or carotid bruits. Cardiac: Irregular rhythm, regular rate. No murmurs. No rubs or gallops.   Respiratory:  Respirations regular and unlabored. Clear to auscultation without rales, wheezing or rhonchi. GI: Soft, nontender, nondistended. Extremities: Radials/DP/PT 2+ and equal bilaterally. No clubbing or cyanosis. No edema bilateral lower extremities. Compression socks in place.   Skin: Warm and dry,  no rash. Neuro: Strength intact.  Assessment & Plan   Chronic diastolic heart failure Echo January 2020 showed normal LV function, mild MR, mild LAE, mildly elevated PA pressure.  Patient reports increased dyspnea with heavier exertion and bending at the waist. She is able to perform light to moderate household activities without dyspnea. She wears compression socks. No lower extremity edema, abdominal fullness/bloating, orthopnea or PND. Euvolemic and well compensated on exam. -Continue Lasix, Toprol. -Will update echo.   Permanent A-fib Patient denies awarness of afib the majority of the time. Will occasionally feel like her heart is "funny" when she gets back to bed from the bathroom. EKG demonstrates afib, 70 bpm.  Denies spontaneous bleeding concerns -Continue Toprol, Eliquis. Appropriate Eliquis dose.    Hypertension BP today 110/70. -Continue Toprol.  Disposition: Echo. Return in 6 months or sooner as needed.          Signed, Etta Grandchild. Denard Tuminello, DNP, NP-C

## 2023-11-25 ENCOUNTER — Encounter: Payer: Self-pay | Admitting: Student

## 2023-11-25 ENCOUNTER — Ambulatory Visit: Payer: PPO | Admitting: Nurse Practitioner

## 2023-11-25 ENCOUNTER — Ambulatory Visit: Payer: PPO | Attending: Student | Admitting: Student

## 2023-11-25 VITALS — BP 110/70 | HR 70 | Ht 68.0 in | Wt 196.5 lb

## 2023-11-25 DIAGNOSIS — I4821 Permanent atrial fibrillation: Secondary | ICD-10-CM | POA: Diagnosis not present

## 2023-11-25 DIAGNOSIS — R0609 Other forms of dyspnea: Secondary | ICD-10-CM | POA: Diagnosis not present

## 2023-11-25 DIAGNOSIS — I10 Essential (primary) hypertension: Secondary | ICD-10-CM | POA: Diagnosis not present

## 2023-11-25 DIAGNOSIS — I5032 Chronic diastolic (congestive) heart failure: Secondary | ICD-10-CM | POA: Diagnosis not present

## 2023-11-25 NOTE — Patient Instructions (Signed)
Medication Instructions:  Your Physician recommend you continue on your current medication as directed.    *If you need a refill on your cardiac medications before your next appointment, please call your pharmacy*   Lab Work: None ordered at this time  If you have labs (blood work) drawn today and your tests are completely normal, you will receive your results only by: MyChart Message (if you have MyChart) OR A paper copy in the mail If you have any lab test that is abnormal or we need to change your treatment, we will call you to review the results.   Testing/Procedures: Your physician has requested that you have an echocardiogram. Echocardiography is a painless test that uses sound waves to create images of your heart. It provides your doctor with information about the size and shape of your heart and how well your heart's chambers and valves are working.   You may receive an ultrasound enhancing agent through an IV if needed to better visualize your heart during the echo. This procedure takes approximately one hour.  There are no restrictions for this procedure.  This will take place at [address] [facility] [address], [place] [address]  Please note: We ask at that you not bring children with you during ultrasound (echo/ vascular) testing. Due to room size and safety concerns, children are not allowed in the ultrasound rooms during exams. Our front office staff cannot provide observation of children in our lobby area while testing is being conducted. An adult accompanying a patient to their appointment will only be allowed in the ultrasound room at the discretion of the ultrasound technician under special circumstances. We apologize for any inconvenience.    Follow-Up: At [facility], you and your health needs are our priority.  As part of our continuing mission to provide you with exceptional heart care, we have created designated Provider Care Teams.  These  Care Teams include your primary Cardiologist (physician) and Advanced Practice Providers (APPs -  Physician Assistants and Nurse Practitioners) who all work together to provide you with the care you need, when you need it.  We recommend signing up for the patient portal called "MyChart".  Sign up information is provided on this After Visit Summary.  MyChart is used to connect with patients for Virtual Visits (Telemedicine).  Patients are able to view lab/test results, encounter notes, upcoming appointments, etc.  Non-urgent messages can be sent to your provider as well.   To learn more about what you can do with MyChart, go to [email or web address].    Your next appointment:   6 month(s)  Provider:   You may see Lorine Bears, MD or one of the following Advanced Practice Providers on your designated Care Team:   Nicolasa Ducking, NP Eula Listen, PA-C Cadence Fransico Michael, PA-C Charlsie Quest, NP Carlos Levering, NP

## 2023-12-10 ENCOUNTER — Other Ambulatory Visit: Payer: Self-pay | Admitting: Cardiovascular Disease

## 2023-12-10 DIAGNOSIS — I482 Chronic atrial fibrillation, unspecified: Secondary | ICD-10-CM

## 2023-12-19 ENCOUNTER — Ambulatory Visit: Payer: PPO | Attending: Student

## 2023-12-19 DIAGNOSIS — R0609 Other forms of dyspnea: Secondary | ICD-10-CM

## 2023-12-19 LAB — ECHOCARDIOGRAM COMPLETE
AV Mean grad: 2 mm[Hg]
AV Peak grad: 3.1 mm[Hg]
Ao pk vel: 0.88 m/s
S' Lateral: 3.1 cm

## 2023-12-20 ENCOUNTER — Other Ambulatory Visit: Payer: Self-pay | Admitting: Family Medicine

## 2023-12-20 DIAGNOSIS — Z1231 Encounter for screening mammogram for malignant neoplasm of breast: Secondary | ICD-10-CM

## 2023-12-21 DIAGNOSIS — H903 Sensorineural hearing loss, bilateral: Secondary | ICD-10-CM | POA: Diagnosis not present

## 2023-12-21 DIAGNOSIS — H6123 Impacted cerumen, bilateral: Secondary | ICD-10-CM | POA: Diagnosis not present

## 2024-01-04 ENCOUNTER — Other Ambulatory Visit: Payer: Self-pay | Admitting: Cardiovascular Disease

## 2024-01-06 ENCOUNTER — Other Ambulatory Visit: Payer: Self-pay | Admitting: Cardiovascular Disease

## 2024-01-06 DIAGNOSIS — I482 Chronic atrial fibrillation, unspecified: Secondary | ICD-10-CM

## 2024-01-06 NOTE — Telephone Encounter (Signed)
Prescription refill request for Eliquis received. Indication:afib Last office visit:12/24 Scr:1.19  12/24 Age: 83 Weight:89.1  kg  Prescription refilled

## 2024-01-06 NOTE — Telephone Encounter (Signed)
 Refill Request.

## 2024-01-13 ENCOUNTER — Ambulatory Visit
Admission: RE | Admit: 2024-01-13 | Discharge: 2024-01-13 | Disposition: A | Discharge status: Home / Self Care | Payer: PPO | Source: Ambulatory Visit | Attending: Family Medicine | Admitting: Family Medicine

## 2024-01-13 DIAGNOSIS — Z1231 Encounter for screening mammogram for malignant neoplasm of breast: Secondary | ICD-10-CM | POA: Insufficient documentation

## 2024-02-09 ENCOUNTER — Other Ambulatory Visit: Payer: Self-pay | Admitting: Family Medicine

## 2024-02-09 ENCOUNTER — Telehealth: Payer: Self-pay | Admitting: Cardiovascular Disease

## 2024-02-09 DIAGNOSIS — G2581 Restless legs syndrome: Secondary | ICD-10-CM

## 2024-02-09 DIAGNOSIS — G629 Polyneuropathy, unspecified: Secondary | ICD-10-CM

## 2024-02-09 DIAGNOSIS — I482 Chronic atrial fibrillation, unspecified: Secondary | ICD-10-CM

## 2024-02-09 MED ORDER — APIXABAN 5 MG PO TABS
5.0000 mg | ORAL_TABLET | Freq: Two times a day (BID) | ORAL | 1 refills | Status: AC
Start: 2024-02-09 — End: ?

## 2024-02-09 NOTE — Telephone Encounter (Signed)
*  STAT* If patient is at the pharmacy, call can be transferred to refill team.   1. Which medications need to be refilled? (please list name of each medication and dose if known)   apixaban (ELIQUIS) 5 MG TABS tablet     4. Which pharmacy/location (including street and city if local pharmacy) is medication to be sent to? [facility] [place], [place] - [address]     5. Do they need a 30 day or 90 day supply? 90

## 2024-02-09 NOTE — Telephone Encounter (Signed)
 Prescription refill request for Eliquis received. Indication: afib  Last office visit: [place] 11/25/2023 Scr: 1.19, 11/18/2023 Age: 83 yo  Weight: 89.1 kg   Refill sent.

## 2024-03-16 DIAGNOSIS — H401123 Primary open-angle glaucoma, left eye, severe stage: Secondary | ICD-10-CM | POA: Diagnosis not present

## 2024-03-16 LAB — HM DIABETES EYE EXAM

## 2024-03-19 ENCOUNTER — Other Ambulatory Visit: Payer: Self-pay | Admitting: Family Medicine

## 2024-03-20 NOTE — Telephone Encounter (Signed)
 Requested medication (s) are due for refill today: yes  Requested medication (s) are on the active medication list: yes  Last refill:  09/27/23 #180 capsules  Future visit scheduled: yes  Notes to clinic:  lab work outside normal range   Requested Prescriptions  Pending Prescriptions Disp Refills   celecoxib (CELEBREX) 200 MG capsule [Pharmacy Med Name: CELECOXIB 200 MG CAP] 180 capsule 0    Sig: TAKE 1 CAPSULE BY MOUTH TWICE DAILY     Analgesics:  COX2 Inhibitors Failed - 03/20/2024  3:41 PM      Failed - Manual Review: Labs are only required if the patient has taken medication for more than 8 weeks.      Failed - Cr in normal range and within 360 days    Creatinine  Date Value Ref Range Status  12/29/2011 0.57 (L) 0.60 - 1.30 mg/dL Final   Creatinine, Ser  Date Value Ref Range Status  11/18/2023 1.19 (H) 0.57 - 1.00 mg/dL Final         Failed - Valid encounter within last 12 months    Recent Outpatient Visits   None     Future Appointments             In 2 months Bacigalupo, Marzella Schlein, MD [facility], [facility]   In 2 months Iran Ouch, MD [facility] - HGB in normal range and within 360 days    Hemoglobin  Date Value Ref Range Status  11/18/2023 15.1 11.1 - 15.9 g/dL Final         Passed - HCT in normal range and within 360 days    Hematocrit  Date Value Ref Range Status  11/18/2023 46.3 34.0 - 46.6 % Final         Passed - AST in normal range and within 360 days    AST  Date Value Ref Range Status  11/18/2023 26 0 - 40 IU/L Final         Passed - ALT in normal range and within 360 days    ALT  Date Value Ref Range Status  11/18/2023 26 0 - 32 IU/L Final         Passed - eGFR is 30 or above and within 360 days    EGFR (African American)  Date Value Ref Range Status  12/29/2011 >60 >49mL/min Final   GFR calc Af Amer  Date Value Ref Range Status  11/26/2020 66 >59  mL/min/1.73 Final    Comment:    **In accordance with recommendations from the NKF-ASN Task force,**   [facility] is in the process of updating its eGFR calculation to the   2021 CKD-EPI creatinine equation that estimates kidney function   without a race variable.    EGFR (Non-African Amer.)  Date Value Ref Range Status  12/29/2011 >60 >71mL/min Final    Comment:    eGFR values <28mL/min/1.73 m2 may be an indication of chronic kidney disease (CKD). Calculated eGFR, using the MRDR Study equation, is useful in  patients with stable renal function. The eGFR calculation will not be reliable in acutely ill patients when serum creatinine is changing rapidly. It is not useful in patients on dialysis. The eGFR calculation may not be applicable to patients at the low and high extremes of body sizes, pregnant women, and vegetarians.    GFR calc non Af Amer  Date Value Ref  Range Status  11/26/2020 57 (L) >59 mL/min/1.73 Final   eGFR  Date Value Ref Range Status  11/18/2023 46 (L) >59 mL/min/1.73 Final         Passed - Patient is not pregnant

## 2024-04-11 DIAGNOSIS — M48062 Spinal stenosis, lumbar region with neurogenic claudication: Secondary | ICD-10-CM | POA: Diagnosis not present

## 2024-04-11 DIAGNOSIS — M5416 Radiculopathy, lumbar region: Secondary | ICD-10-CM | POA: Diagnosis not present

## 2024-05-21 ENCOUNTER — Encounter: Payer: Self-pay | Admitting: Family Medicine

## 2024-05-21 ENCOUNTER — Ambulatory Visit (INDEPENDENT_AMBULATORY_CARE_PROVIDER_SITE_OTHER): Payer: Self-pay | Admitting: Family Medicine

## 2024-05-21 ENCOUNTER — Telehealth: Payer: Self-pay | Admitting: Family Medicine

## 2024-05-21 VITALS — BP 139/85 | HR 84 | Temp 97.7°F | Resp 16 | Ht 67.0 in | Wt 199.6 lb

## 2024-05-21 DIAGNOSIS — I482 Chronic atrial fibrillation, unspecified: Secondary | ICD-10-CM

## 2024-05-21 DIAGNOSIS — M151 Heberden's nodes (with arthropathy): Secondary | ICD-10-CM

## 2024-05-21 DIAGNOSIS — I152 Hypertension secondary to endocrine disorders: Secondary | ICD-10-CM

## 2024-05-21 DIAGNOSIS — E785 Hyperlipidemia, unspecified: Secondary | ICD-10-CM | POA: Diagnosis not present

## 2024-05-21 DIAGNOSIS — F5101 Primary insomnia: Secondary | ICD-10-CM

## 2024-05-21 DIAGNOSIS — Z0000 Encounter for general adult medical examination without abnormal findings: Secondary | ICD-10-CM

## 2024-05-21 DIAGNOSIS — E1142 Type 2 diabetes mellitus with diabetic polyneuropathy: Secondary | ICD-10-CM

## 2024-05-21 DIAGNOSIS — Z0001 Encounter for general adult medical examination with abnormal findings: Secondary | ICD-10-CM | POA: Diagnosis not present

## 2024-05-21 DIAGNOSIS — E1169 Type 2 diabetes mellitus with other specified complication: Secondary | ICD-10-CM | POA: Diagnosis not present

## 2024-05-21 DIAGNOSIS — D6869 Other thrombophilia: Secondary | ICD-10-CM

## 2024-05-21 DIAGNOSIS — E1159 Type 2 diabetes mellitus with other circulatory complications: Secondary | ICD-10-CM

## 2024-05-21 DIAGNOSIS — Z89419 Acquired absence of unspecified great toe: Secondary | ICD-10-CM

## 2024-05-21 MED ORDER — VEOZAH 45 MG PO TABS: 45.0000 mg | ORAL_TABLET | Freq: Every day | ORAL | 1 refills | Status: DC

## 2024-05-21 NOTE — Assessment & Plan Note (Signed)
 Symptoms managed with Lyrica  100 mg once daily at night. - Continue Lyrica  100 mg once daily at night

## 2024-05-21 NOTE — Assessment & Plan Note (Signed)
 Diet-controlled diabetes with well-controlled A1c. Discussed dietary modifications including changing to a lower fat mayonnaise which she plans on. Also discussed using whole grain breads or lower calorie breads when possible. - Recheck A1c

## 2024-05-21 NOTE — Assessment & Plan Note (Signed)
 Discussed sleep hygiene and age related sleep changes. She is averaging around 5 hours per night but waking up early many days. -follow up if unable to obtain reasonable amount of sleep per night.

## 2024-05-21 NOTE — Assessment & Plan Note (Addendum)
 Patient is on Eliquis  5 mg for anticoagulation. Has had some dyspnea on exertion but has been worked up by cardiology which revealed no changes via echocardiogram. She will continue to follow closely with cardiology - Continue Eliquis  5 mg BID - Continue metoprolol  - CBC ordered

## 2024-05-21 NOTE — Assessment & Plan Note (Signed)
Due to Afib 

## 2024-05-21 NOTE — Progress Notes (Addendum)
 Annual Wellness Visit     Patient: Teresa Harris, Female    DOB: 1941-10-12, 83 y.o.   MRN: [id number]  Subjective  Chief Complaint  Patient presents with   Annual Exam   Medicare Wellness    Teresa Harris Kari is a 83 y.o. female who presents today for her Annual Wellness Visit. She reports occasionally fasting for breakfast and is eating around 2 meals a day on average but is having a good bit of mayo with her meals. Exercise is limited by cardiac condition(s): chronic Afib, . She generally feels well. She reports sleeping somewhat spordically; she will sleep one night and not sleep the next. She does sleep well when she can sleep. Reports sleeping around 5 hours a night. Does not take naps.  She does have additional problems to discuss today.   She is concerned about her weight and that she is not losing much. She wants to make some dietary changes and also feels that exercise for is prohibitive because of her heart. She also has developed a bump on her right index DIP joint that she wants looked at. She also is having vasomotor symptoms that are worsening since last fall. She was prescribed Veozah  in September of last year but did not pick it up and would like to try this again.   Past Medical History:  Diagnosis Date   (HFpEF) heart failure with preserved ejection fraction (HCC)    a. 12/2018 Echo: EF 60-65%, no rwma, mild MR. Mildly dil LA. Nl RV fxn. PASP .   Adverse effect of anesthesia 11/18/2015   Arthritis    Coccyalgia 01/27/2018   Complication of anesthesia    trouble waking up with Propofol    Depression    h/o   Family history of adverse reaction to anesthesia    daughter PONV   Glaucoma    left eye   Headache    H/O MIGRAINES   Hemorrhoids    Hiatal hernia    History of kidney stones    History of migraine headaches    History of stress test    a. 10/2014 MV: No ischemia.   Hx MRSA infection    Hypercholesteremia    Motion sickness    boats    Neuropathy    feet, fingers   Osteopenia    Permanent atrial fibrillation (HCC) 10/01/2014   a. CHA2DS2VASc = 3-->eliquis .   PONV (postoperative nausea and vomiting)    Wears dentures    full upper, partial lower   Wears hearing aid in both ears    Past Surgical History:  Procedure Laterality Date   ABDOMINAL HYSTERECTOMY     AUGMENTATION MAMMAPLASTY Bilateral 2004   BROW LIFT Bilateral 12/19/2020   Procedure: BLEPHAROPLASTY UPPER EYELID; W/EXCESS SKIN BLEPHAROPTOSIS REPAIR; RESECT EX BILATERAL;  Surgeon: Zacarias Hermann, MD;  Location: [facility];  Service: Ophthalmology;  Laterality: Bilateral;   CARPAL TUNNEL RELEASE Right 02/16/2016   Procedure: CARPAL TUNNEL RELEASE;  Surgeon: Arlyne Lame, MD;  Location: [facility];  Service: Orthopedics;  Laterality: Right;   CARPAL TUNNEL RELEASE Left 04/12/2016   Procedure: CARPAL TUNNEL RELEASE;  Surgeon: Arlyne Lame, MD;  Location: [facility];  Service: Orthopedics;  Laterality: Left;   CARPAL TUNNEL RELEASE Right 03/13/2018   Procedure: CARPAL TUNNEL RELEASE;  Surgeon: Berta Brittle, MD;  Location: [facility];  Service: Neurosurgery;  Laterality: Right;   CATARACT EXTRACTION, BILATERAL     COLONOSCOPY  12/29/2009   Dr Evone Hoh  EYE SURGERY Left    shunt placed   EYE SURGERY Left    tube placed   FOOT SURGERY     bilateral    JOINT REPLACEMENT     NASAL SEPTUM SURGERY     REPLACEMENT TOTAL KNEE     bilateral   SHOULDER SURGERY     right shoulder   TOE SURGERY Left    2023      Medications: Outpatient Medications Prior to Visit  Medication Sig   apixaban  (ELIQUIS ) 5 MG TABS tablet Take 1 tablet (5 mg total) by mouth 2 (two) times daily.   Calcium Carbonate-Vitamin D  (CALCIUM-VITAMIN D3 PO) Take 1 tablet by mouth daily. Calcium 600 = D3 1000   celecoxib  (CELEBREX ) 200 MG capsule TAKE 1 CAPSULE BY MOUTH TWICE DAILY   Cholecalciferol (VITAMIN D3) 50 MCG (2000 UT) capsule Take 2,000 Units by mouth daily.   cycloSPORINE  (RESTASIS) 0.05 % ophthalmic emulsion Place 1 drop into both eyes 2 (two) times daily.    dorzolamide-timolol (COSOPT) 22.3-6.8 MG/ML ophthalmic solution Place 1 drop into both eyes 2 (two) times daily.    fenofibrate  (TRICOR ) 145 MG tablet TAKE 1 TABLET BY MOUTH ONCE DAILY   furosemide  (LASIX ) 20 MG tablet TAKE 1 TABLET BY MOUTH ONCE DAILY   latanoprost (XALATAN) 0.005 % ophthalmic solution Place 1 drop into both eyes daily.    Magnesium 400 MG CAPS Take 400 mg by mouth daily.    metoprolol  succinate (TOPROL -XL) 25 MG 24 hr tablet TAKE 1/2 TABLET BY MOUTH ONCE DAILY   METRONIDAZOLE, TOPICAL, 0.75 % LOTN Apply topically daily. Apply to face in AM   Multiple Vitamin (MULTIVITAMIN) tablet Take 1 tablet by mouth daily.   Omega-3 Fatty Acids (FISH OIL) 1200 MG CAPS Take by mouth 2 (two) times daily.   omeprazole (PRILOSEC) 40 MG capsule Take 40 mg by mouth every morning.    pregabalin  (LYRICA ) 100 MG capsule TAKE 1 CAPSULE BY MOUTH ONCE DAILY   SOOLANTRA 1 % CREA Apply 1 application topically daily. Sometimes uses twice daily as needed for flare ups.   [DISCONTINUED] NON FORMULARY Place 12 sprays into both nostrils 6 (six) times daily. 250GU irrigant   No facility-administered medications prior to visit.    Allergies  Allergen Reactions   [name] [Moxifloxacin Hcl In Nacl] Other (See Comments)    Headache   Bimatoprost Other (See Comments)    Made my eyes red, and hurt   Brimonidine Other (See Comments)    Made my eyes red, and hurt  Burned eyes.  Made my eyes red, and hurt    Burned eyes.    Burned eyes. Made my eyes red, and hurt Burned eyes. Made my eyes red, and hurt Burned eyes.   Clindamycin /Lincomycin Diarrhea    Severe diarrhea   Eszopiclone     Mobic [Meloxicam] Other (See Comments)    headache   Propofol  Other (See Comments)    Had difficulty waking up Difficult to wake up.   Travoprost Other (See Comments)    Unknown   Vicodin [Hydrocodone-Acetaminophen ] Nausea And  Vomiting and Other (See Comments)    Sick on stomach   Cephalexin Nausea And Vomiting and Other (See Comments)    headache  headache    headache headache headache   Iodine Rash    Per pt    Patient Care Team: Mazie Speed, MD as PCP - General (Family Medicine) Wenona Hamilton, MD as PCP - Cardiology (Cardiology) Wenona Hamilton, MD  as Consulting Physician (Cardiology) Rosa College, MD as Consulting Physician (Ophthalmology) Earvin Goldberg, MD as Referring Physician (Physical Medicine and Rehabilitation) Lesly Raspberry, MD (Otolaryngology)  Objective  BP 139/85 (BP Location: Left Arm, Patient Position: Sitting, Cuff Size: Normal)   Pulse 84   Temp 97.7 F (36.5 C) (Oral)   Resp 16   Ht 5' 7 (1.702 m)   Wt 199 lb 9.6 oz (90.5 kg)   SpO2 95%   BMI 31.26 kg/m  BP Readings from Last 3 Encounters:  05/21/24 139/85  11/25/23 110/70  11/14/23 127/81   Wt Readings from Last 3 Encounters:  05/21/24 199 lb 9.6 oz (90.5 kg)  11/25/23 196 lb 8 oz (89.1 kg)  11/14/23 197 lb 11.2 oz (89.7 kg)   Physical Exam Constitutional:      General: She is not in acute distress.    Appearance: Normal appearance. She is not toxic-appearing.  HENT:     Head: Normocephalic.     Right Ear: External ear normal.     Left Ear: External ear normal.     Nose: Nose normal.     Mouth/Throat:     Mouth: Mucous membranes are moist.     Pharynx: Oropharynx is clear. No oropharyngeal exudate.   Eyes:     General: No scleral icterus.    Extraocular Movements: Extraocular movements intact.     Conjunctiva/sclera: Conjunctivae normal.     Pupils: Pupils are equal, round, and reactive to light.    Cardiovascular:     Rate and Rhythm: Rhythm irregular.     Comments: In Afib, Rate and rhythm are irregular Pulmonary:     Effort: Pulmonary effort is normal. No respiratory distress.     Breath sounds: Normal breath sounds. No wheezing or rales.  Abdominal:     General:  Abdomen is flat. There is no distension.   Musculoskeletal:        General: Deformity present. No swelling or signs of injury. Normal range of motion.     Cervical back: Normal range of motion.     Right lower leg: No edema.     Left lower leg: No edema.     Comments: Bilateral heberden nodes    Skin:    General: Skin is warm and dry.     Coloration: Skin is not jaundiced.     Findings: No bruising or lesion.   Neurological:     General: No focal deficit present.     Mental Status: She is alert and oriented to person, place, and time. Mental status is at baseline.     Cranial Nerves: No cranial nerve deficit.     Sensory: No sensory deficit.     Motor: No weakness.     Gait: Gait normal.   Psychiatric:        Mood and Affect: Mood normal.        Behavior: Behavior normal.        Thought Content: Thought content normal.    Most recent functional status assessment:    05/21/2024   10:51 AM  In your present state of health, do you have any difficulty performing the following activities:  Hearing? 0  Vision? 1  Difficulty concentrating or making decisions? 0  Walking or climbing stairs? 1  Dressing or bathing? 0  Doing errands, shopping? 0  Preparing Food and eating ? N  Using the Toilet? N  In the past six months, have you accidently leaked urine? N  Do you  have problems with loss of bowel control? N  Managing your Medications? N  Managing your Finances? N  Housekeeping or managing your Housekeeping? Y   Most recent fall risk assessment:    05/21/2024   10:43 AM  Fall Risk   Falls in the past year? 0  Number falls in past yr: 0  Injury with Fall? 0  Risk for fall due to : No Fall Risks    Most recent depression screenings:    05/21/2024   10:43 AM 11/14/2023   11:04 AM  PHQ 2/9 Scores  PHQ - 2 Score 0 0   Most recent cognitive screening:    05/21/2024   10:55 AM  6CIT Screen  What Year? 0 points  What month? 0 points  What time? 0 points  Count back  from 20 0 points  Months in reverse 0 points  Repeat phrase 0 points  Total Score 0 points   Most recent Audit-C alcohol use screening    05/21/2024   10:54 AM  Alcohol Use Disorder Test (AUDIT)  1. How often do you have a drink containing alcohol? 1  2. How many drinks containing alcohol do you have on a typical day when you are drinking? 0  3. How often do you have six or more drinks on one occasion? 0  AUDIT-C Score 1   A score of 3 or more in women, and 4 or more in men indicates increased risk for alcohol abuse, EXCEPT if all of the points are from question 1   Vision/Hearing Screen: No results found.  Last CBC Lab Results  Component Value Date   WBC 4.1 11/18/2023   HGB 15.1 11/18/2023   HCT 46.3 11/18/2023   MCV 99 (H) 11/18/2023   MCH 32.3 11/18/2023   RDW 12.0 11/18/2023   PLT 149 (L) 11/18/2023   Last metabolic panel Lab Results  Component Value Date   GLUCOSE 115 (H) 11/18/2023   NA 143 11/18/2023   K 4.2 11/18/2023   CL 102 11/18/2023   CO2 27 11/18/2023   BUN 23 11/18/2023   CREATININE 1.19 (H) 11/18/2023   EGFR 46 (L) 11/18/2023   CALCIUM 9.7 11/18/2023   PROT 6.8 11/18/2023   ALBUMIN 4.2 11/18/2023   LABGLOB 2.6 11/18/2023   AGRATIO 1.6 04/18/2023   BILITOT 0.6 11/18/2023   ALKPHOS 48 11/18/2023   AST 26 11/18/2023   ALT 26 11/18/2023   ANIONGAP 10 07/17/2020   Last lipids Lab Results  Component Value Date   CHOL 212 (H) 11/18/2023   HDL 50 11/18/2023   LDLCALC 132 (H) 11/18/2023   TRIG 166 (H) 11/18/2023   CHOLHDL 4.2 11/18/2023   Last hemoglobin A1c Lab Results  Component Value Date   HGBA1C 6.1 (H) 11/18/2023      No results found for any visits on 05/21/24.    Assessment & Plan   Annual wellness visit done today including the all of the following: Reviewed patient's Family Medical History Reviewed and updated list of patient's medical providers Assessment of cognitive impairment was done Assessed patient's functional  ability Established a written schedule for health screening services Health Risk Assessent Completed and Reviewed  Exercise Activities and Dietary recommendations  Goals      DIET - INCREASE WATER INTAKE     Recommend increasing water intake to 6-8 glasses a day.      DIET - REDUCE CALORIE INTAKE     Recommend to continue current diet plan of cutting down  on calories and carbohydrates to help aid in weight loss.         Immunization History  Administered Date(s) Administered   Fluad Quad(high Dose 65+) 08/29/2019, 10/08/2020, 09/30/2021, 09/22/2022   Fluad Trivalent(High Dose 65+) 08/23/2023   Influenza, High Dose Seasonal PF 08/26/2016, 09/07/2017, 08/17/2018   Influenza,inj,Quad PF,6+ Mos 09/09/2014   Influenza-Unspecified 10/08/2015   PFIZER(Purple Top)SARS-COV-2 Vaccination 01/02/2020, 01/23/2020, 09/22/2020   Pneumococcal Conjugate-13 11/11/2014   Pneumococcal Polysaccharide-23 10/27/2012   Tdap 12/03/2010    Health Maintenance  Topic Date Due   OPHTHALMOLOGY EXAM  Never done   Zoster Vaccines- Shingrix (1 of 2) Never done   DTaP/Tdap/Td (2 - Td or Tdap) 12/03/2020   COVID-19 Vaccine (4 - 2024-25 season) 08/07/2023   FOOT EXAM  04/17/2024   HEMOGLOBIN A1C  05/18/2024   INFLUENZA VACCINE  07/06/2024   Diabetic kidney evaluation - Urine ACR  11/13/2024   Diabetic kidney evaluation - eGFR measurement  11/17/2024   Medicare Annual Wellness (AWV)  05/21/2025   DEXA SCAN  06/11/2027   Pneumococcal Vaccine: 50+ Years  Completed   HPV VACCINES  Aged Out   Meningococcal B Vaccine  Aged Out   Hepatitis C Screening  Discontinued     Discussed health benefits of physical activity, and encouraged her to engage in regular exercise appropriate for her age and condition.    Problem List Items Addressed This Visit       Cardiovascular and Mediastinum   Hypertension associated with diabetes (HCC)   Well-controlled hypertension. Blood pressure WNL in office today -continue  metoprolol          Relevant Orders   Comprehensive Metabolic Panel (CMET)   Atrial fibrillation, chronic (HCC)   Patient is on Eliquis  5 mg for anticoagulation. Has had some dyspnea on exertion but has been worked up by cardiology which revealed no changes via echocardiogram. She will continue to follow closely with cardiology - Continue Eliquis  5 mg BID - Continue metoprolol  - CBC ordered        Relevant Orders   CBC w/Diff/Platelet     Endocrine   Hyperlipidemia associated with type 2 diabetes mellitus (HCC)   Patient is on fenofibrate  and tolerating well. Last cholesterol was elevated but stable from prior labs. - Continue fenofibrate  - Recheck lipid panel         Relevant Orders   Lipid Panel With LDL/HDL Ratio   Comprehensive Metabolic Panel (CMET)   Diabetic peripheral neuropathy (HCC)   Symptoms managed with Lyrica  100 mg once daily at night. - Continue Lyrica  100 mg once daily at night        T2DM (type 2 diabetes mellitus) (HCC)   Diet-controlled diabetes with well-controlled A1c. Discussed dietary modifications including changing to a lower fat mayonnaise which she plans on. Also discussed using whole grain breads or lower calorie breads when possible. - Recheck A1c        Relevant Orders   Microalbumin / creatinine urine ratio   HgB A1c     Musculoskeletal and Integument   Heberden nodes of both hands   Patient has heberden nodes bilaterally and are somewhat bothersome for her but not extremely painful. Informed these can be a part of normal aging which she was reassured by. -continue to monitor        Hematopoietic and Hemostatic   Acquired thrombophilia (HCC)   Due to A fib       Relevant Orders   CBC w/Diff/Platelet     Other  Primary insomnia   Discussed sleep hygiene and age related sleep changes. She is averaging around 5 hours per night but waking up early many days. -follow up if unable to obtain reasonable amount of sleep per night.       History of amputation of great toe (HCC)   stable      Other Visit Diagnoses       Encounter for annual wellness visit (AWV) in Medicare patient    -  Primary     Encounter for annual physical exam       Relevant Orders   HgB A1c   Lipid Panel With LDL/HDL Ratio   Comprehensive Metabolic Panel (CMET)   CBC w/Diff/Platelet       Return in about 6 months (around 11/20/2024) for chronic disease f/u.     Monda Angry, Medical Student   Patient seen along with MS3 student, Luther Saltness. I personally evaluated this patient along with the student, and verified all aspects of the history, physical exam, and medical decision making as documented by the student. I agree with the student's documentation and have made all necessary edits.  Loranzo Desha, Stan Eans, MD, MPH [facility]

## 2024-05-21 NOTE — Assessment & Plan Note (Signed)
 Patient is on fenofibrate  and tolerating well. Last cholesterol was elevated but stable from prior labs. - Continue fenofibrate  - Recheck lipid panel

## 2024-05-21 NOTE — Assessment & Plan Note (Signed)
 stable

## 2024-05-21 NOTE — Telephone Encounter (Signed)
 Copied from CRM [id number]. Topic: Clinical - Medication Prior Auth >> May 21, 2024  2:03 PM Teresa Harris wrote: Reason for CRM: Patient states pharmacy said a PA for Fezolinetant  (VEOZAH ) 45 MG TABS. Callback number is [phone]

## 2024-05-21 NOTE — Assessment & Plan Note (Signed)
 Patient has heberden nodes bilaterally and are somewhat bothersome for her but not extremely painful. Informed these can be a part of normal aging which she was reassured by. -continue to monitor

## 2024-05-21 NOTE — Assessment & Plan Note (Signed)
 Well-controlled hypertension. Blood pressure WNL in office today -continue metoprolol 

## 2024-05-22 ENCOUNTER — Ambulatory Visit: Payer: Self-pay | Admitting: Family Medicine

## 2024-05-22 ENCOUNTER — Other Ambulatory Visit (HOSPITAL_COMMUNITY): Payer: Self-pay

## 2024-05-22 ENCOUNTER — Telehealth: Payer: Self-pay

## 2024-05-22 LAB — CBC WITH DIFFERENTIAL/PLATELET
Basophils Absolute: 0.1 10*3/uL (ref 0.0–0.2)
Basos: 1 %
EOS (ABSOLUTE): 0.2 10*3/uL (ref 0.0–0.4)
Eos: 4 %
Hematocrit: 48.1 % — ABNORMAL HIGH (ref 34.0–46.6)
Hemoglobin: 15.6 g/dL (ref 11.1–15.9)
Immature Grans (Abs): 0 10*3/uL (ref 0.0–0.1)
Immature Granulocytes: 0 %
Lymphocytes Absolute: 1.3 10*3/uL (ref 0.7–3.1)
Lymphs: 25 %
MCH: 32 pg (ref 26.6–33.0)
MCHC: 32.4 g/dL (ref 31.5–35.7)
MCV: 99 fL — ABNORMAL HIGH (ref 79–97)
Monocytes Absolute: 0.5 10*3/uL (ref 0.1–0.9)
Monocytes: 10 %
Neutrophils Absolute: 3.1 10*3/uL (ref 1.4–7.0)
Neutrophils: 60 %
Platelets: 164 10*3/uL (ref 150–450)
RBC: 4.87 x10E6/uL (ref 3.77–5.28)
RDW: 12.5 % (ref 11.7–15.4)
WBC: 5.2 10*3/uL (ref 3.4–10.8)

## 2024-05-22 LAB — COMPREHENSIVE METABOLIC PANEL WITH GFR
ALT: 31 IU/L (ref 0–32)
AST: 37 IU/L (ref 0–40)
Albumin: 4.3 g/dL (ref 3.7–4.7)
Alkaline Phosphatase: 55 IU/L (ref 44–121)
BUN/Creatinine Ratio: 21 (ref 12–28)
BUN: 24 mg/dL (ref 8–27)
Bilirubin Total: 0.6 mg/dL (ref 0.0–1.2)
CO2: 28 mmol/L (ref 20–29)
Calcium: 9.9 mg/dL (ref 8.7–10.3)
Chloride: 102 mmol/L (ref 96–106)
Creatinine, Ser: 1.15 mg/dL — ABNORMAL HIGH (ref 0.57–1.00)
Globulin, Total: 2.7 g/dL (ref 1.5–4.5)
Glucose: 129 mg/dL — ABNORMAL HIGH (ref 70–99)
Potassium: 5.8 mmol/L — ABNORMAL HIGH (ref 3.5–5.2)
Sodium: 143 mmol/L (ref 134–144)
Total Protein: 7 g/dL (ref 6.0–8.5)
eGFR: 48 mL/min/{1.73_m2} — ABNORMAL LOW (ref >59)

## 2024-05-22 LAB — MICROALBUMIN / CREATININE URINE RATIO
Creatinine, Urine: 18 mg/dL
Microalb/Creat Ratio: <17 mg/g{creat} (ref 0–29)
Microalbumin, Urine: <3 ug/mL

## 2024-05-22 LAB — LIPID PANEL WITH LDL/HDL RATIO
Cholesterol, Total: 204 mg/dL — ABNORMAL HIGH (ref 100–199)
HDL: 52 mg/dL (ref >39)
LDL Chol Calc (NIH): 132 mg/dL — ABNORMAL HIGH (ref 0–99)
LDL/HDL Ratio: 2.5 ratio (ref 0.0–3.2)
Triglycerides: 112 mg/dL (ref 0–149)
VLDL Cholesterol Cal: 20 mg/dL (ref 5–40)

## 2024-05-22 LAB — HEMOGLOBIN A1C
Est. average glucose Bld gHb Est-mCnc: 137 mg/dL
Hgb A1c MFr Bld: 6.4 % — ABNORMAL HIGH (ref 4.8–5.6)

## 2024-05-22 NOTE — Telephone Encounter (Signed)
 Pharmacy Patient Advocate Encounter  Received notification from [employer] that Prior Authorization for Veozah  45MG  tablets  has been APPROVED from 05/22/24 to 11/18/24. Ran test claim, Copay is $0. This test claim was processed through [facility]- copay amounts may vary at other pharmacies due to pharmacy/plan contracts, or as the patient moves through the different stages of their insurance plan.   PA #/Case ID/Reference #: [id number]

## 2024-05-25 ENCOUNTER — Other Ambulatory Visit (HOSPITAL_COMMUNITY): Payer: Self-pay

## 2024-05-25 ENCOUNTER — Telehealth: Payer: Self-pay

## 2024-05-25 NOTE — Telephone Encounter (Signed)
 Copied from CRM [id number]. Topic: Clinical - Lab/Test Results >> May 24, 2024  4:35 PM Santiya F wrote: Reason for CRM: Patient called in to receive lab results. Patient received labs. Patient has some questions about labs. Patient requests a call back tomorrow to discuss the labs.

## 2024-05-28 NOTE — Telephone Encounter (Signed)
 Patient advised and verbalized understading

## 2024-05-28 NOTE — Telephone Encounter (Signed)
 Potatoes, bananas, sweet potatoes, spinach, avocado, broccoli are high potassium foods to decrease intake of.

## 2024-05-29 ENCOUNTER — Ambulatory Visit: Attending: Cardiovascular Disease | Admitting: Cardiovascular Disease

## 2024-05-29 ENCOUNTER — Encounter: Payer: Self-pay | Admitting: Cardiovascular Disease

## 2024-05-29 VITALS — BP 100/64 | HR 77 | Ht 62.0 in | Wt 197.5 lb

## 2024-05-29 DIAGNOSIS — I5032 Chronic diastolic (congestive) heart failure: Secondary | ICD-10-CM | POA: Diagnosis not present

## 2024-05-29 DIAGNOSIS — E785 Hyperlipidemia, unspecified: Secondary | ICD-10-CM

## 2024-05-29 DIAGNOSIS — I482 Chronic atrial fibrillation, unspecified: Secondary | ICD-10-CM

## 2024-05-29 NOTE — Progress Notes (Signed)
 Cardiology Office Note   Date:  05/29/2024   ID:  Teresa Harris, DOB 20-Jan-1941, MRN [id number]  PCP:  Myrla Jon HERO, MD  Cardiologist:   Deatrice Cage, MD   Chief Complaint  Patient presents with   Follow-up    6 month f/u c/o sob with exertion. Meds reviewed verbally with pt.      History of Present Illness: Teresa Harris is a 83 y.o. female who presents for a follow-up regarding chronic atrial fibrillation.  She has borderline diabetes and hyperlipidemia. She smoked in the past more than 30 years ago. There is no family history of coronary artery disease or arrhythmia.  Nuclear stress test in November 2015 which showed no evidence of ischemia with normal ejection fraction. Echocardiogram showed normal EF with mild to moderate mitral regurgitation and mildly dilated left atrium.  She is only able to tolerate small dose metoprolol  due to extreme fatigue.    She had mild diastolic heart failure in early 2023 that required adding small dose furosemide .  Most recent echocardiogram in January 2020 showed an EF of 60 to 65% with mild pulmonary hypertension.  She has been doing well with no recent chest pain or worsening dyspnea.  No palpitations.  Past Medical History:  Diagnosis Date   (HFpEF) heart failure with preserved ejection fraction (HCC)    a. 12/2018 Echo: EF 60-65%, no rwma, mild MR. Mildly dil LA. Nl RV fxn. PASP .   Adverse effect of anesthesia 11/18/2015   Arthritis    Coccyalgia 01/27/2018   Complication of anesthesia    trouble waking up with Propofol    Depression    h/o   Family history of adverse reaction to anesthesia    daughter PONV   Glaucoma    left eye   Headache    H/O MIGRAINES   Hemorrhoids    Hiatal hernia    History of kidney stones    History of migraine headaches    History of stress test    a. 10/2014 MV: No ischemia.   Hx MRSA infection    Hypercholesteremia    Motion sickness    boats   Neuropathy     feet, fingers   Osteopenia    Permanent atrial fibrillation (HCC) 10/01/2014   a. CHA2DS2VASc = 3-->eliquis .   PONV (postoperative nausea and vomiting)    Wears dentures    full upper, partial lower   Wears hearing aid in both ears     Past Surgical History:  Procedure Laterality Date   ABDOMINAL HYSTERECTOMY     AUGMENTATION MAMMAPLASTY Bilateral 2004   BROW LIFT Bilateral 12/19/2020   Procedure: BLEPHAROPLASTY UPPER EYELID; W/EXCESS SKIN BLEPHAROPTOSIS REPAIR; RESECT EX BILATERAL;  Surgeon: Ashley Greig HERO, MD;  Location: [facility];  Service: Ophthalmology;  Laterality: Bilateral;   CARPAL TUNNEL RELEASE Right 02/16/2016   Procedure: CARPAL TUNNEL RELEASE;  Surgeon: Lynwood SHAUNNA Hue, MD;  Location: [facility];  Service: Orthopedics;  Laterality: Right;   CARPAL TUNNEL RELEASE Left 04/12/2016   Procedure: CARPAL TUNNEL RELEASE;  Surgeon: Lynwood SHAUNNA Hue, MD;  Location: [facility];  Service: Orthopedics;  Laterality: Left;   CARPAL TUNNEL RELEASE Right 03/13/2018   Procedure: CARPAL TUNNEL RELEASE;  Surgeon: Bluford Standing, MD;  Location: [facility];  Service: Neurosurgery;  Laterality: Right;   CATARACT EXTRACTION, BILATERAL     COLONOSCOPY  12/29/2009   Dr Christ   EYE SURGERY Left    shunt placed   EYE SURGERY Left  tube placed   FOOT SURGERY     bilateral    JOINT REPLACEMENT     NASAL SEPTUM SURGERY     REPLACEMENT TOTAL KNEE     bilateral   SHOULDER SURGERY     right shoulder   TOE SURGERY Left    2023     Current Outpatient Medications  Medication Sig Dispense Refill   apixaban  (ELIQUIS ) 5 MG TABS tablet Take 1 tablet (5 mg total) by mouth 2 (two) times daily. 180 tablet 1   Calcium Carbonate-Vitamin D  (CALCIUM-VITAMIN D3 PO) Take 1 tablet by mouth daily. Calcium 600 = D3 1000     celecoxib  (CELEBREX ) 200 MG capsule TAKE 1 CAPSULE BY MOUTH TWICE DAILY (Patient taking differently: Take 200 mg by mouth daily.) 180 capsule 0   Cholecalciferol (VITAMIN D3) 50 MCG  (2000 UT) capsule Take 2,000 Units by mouth daily.     cycloSPORINE (RESTASIS) 0.05 % ophthalmic emulsion Place 1 drop into both eyes 2 (two) times daily.      dorzolamide-timolol (COSOPT) 22.3-6.8 MG/ML ophthalmic solution Place 1 drop into both eyes 2 (two) times daily.      fenofibrate  (TRICOR ) 145 MG tablet TAKE 1 TABLET BY MOUTH ONCE DAILY 90 tablet 2   Fezolinetant  (VEOZAH ) 45 MG TABS Take 1 tablet (45 mg total) by mouth daily. 90 tablet 1   furosemide  (LASIX ) 20 MG tablet TAKE 1 TABLET BY MOUTH ONCE DAILY 90 tablet 2   latanoprost (XALATAN) 0.005 % ophthalmic solution Place 1 drop into both eyes daily.      Magnesium 400 MG CAPS Take 400 mg by mouth daily.      metoprolol  succinate (TOPROL -XL) 25 MG 24 hr tablet TAKE 1/2 TABLET BY MOUTH ONCE DAILY 45 tablet 2   METRONIDAZOLE, TOPICAL, 0.75 % LOTN Apply topically daily. Apply to face in AM     Multiple Vitamin (MULTIVITAMIN) tablet Take 1 tablet by mouth daily.     Omega-3 Fatty Acids (FISH OIL) 1200 MG CAPS Take by mouth 2 (two) times daily.     omeprazole (PRILOSEC) 40 MG capsule Take 40 mg by mouth every morning.      pregabalin  (LYRICA ) 100 MG capsule TAKE 1 CAPSULE BY MOUTH ONCE DAILY 90 capsule 1   SOOLANTRA 1 % CREA Apply 1 application topically daily. Sometimes uses twice daily as needed for flare ups.     No current facility-administered medications for this visit.    Allergies:   [name] [moxifloxacin hcl in nacl], Bimatoprost, Brimonidine, Clindamycin /lincomycin, Eszopiclone , Mobic [meloxicam], Propofol , Travoprost, Vicodin [hydrocodone-acetaminophen ], Cephalexin, and Iodine    Social History:  The patient  reports that she quit smoking about 28 years ago. Her smoking use included cigarettes. She started smoking about 58 years ago. She has a 30 pack-year smoking history. She has never used smokeless tobacco. She reports current alcohol use. She reports that she does not use drugs.   Family History:  The patient's family  history includes Arthritis in her father and mother.    ROS:  Please see the history of present illness.   Otherwise, review of systems are positive for none.   All other systems are reviewed and negative.    PHYSICAL EXAM: VS:  BP 100/64 (BP Location: Left Arm, Patient Position: Sitting, Cuff Size: Normal)   Pulse 77   Ht 5' 2 (1.575 m)   Wt 197 lb 8 oz (89.6 kg)   SpO2 97%   BMI 36.12 kg/m  , BMI Body mass index  is 36.12 kg/m. GEN: Well nourished, well developed, in no acute distress  HEENT: normal  Neck: No JVD, carotid bruits, or masses Cardiac: Irregularly irregular ; no murmurs, rubs, or gallops, trace bilateral leg edema edema  Respiratory:  clear to auscultation bilaterally, normal work of breathing GI: soft, nontender, nondistended, + BS MS: no deformity or atrophy  Skin: warm and dry, no rash Neuro:  Strength and sensation are intact Psych: euthymic mood, full affect   EKG:  EKG is  ordered today. EKG showed: Atrial fibrillation Possible Anterior infarct , age undetermined When compared with ECG of 25-Nov-2023 13:59, No significant change was found   Recent Labs: 05/21/2024: ALT 31; BUN 24; Creatinine, Ser 1.15; Hemoglobin 15.6; Platelets 164; Potassium 5.8; Sodium 143    Lipid Panel    Component Value Date/Time   CHOL 204 (H) 05/21/2024 0000   TRIG 112 05/21/2024 0000   HDL 52 05/21/2024 0000   CHOLHDL 4.2 11/18/2023 0947   CHOLHDL 3.7 07/17/2020 1010   VLDL 40 07/17/2020 1010   LDLCALC 132 (H) 05/21/2024 0000      Wt Readings from Last 3 Encounters:  05/29/24 197 lb 8 oz (89.6 kg)  05/21/24 199 lb 9.6 oz (90.5 kg)  11/25/23 196 lb 8 oz (89.1 kg)       ASSESSMENT AND PLAN:  1. Chronic atrial fibrillation: Ventricular rate remains controlled on small dose Toprol .  No issues with anticoagulation.  Most recent labs showed stable renal function and creatinine below 1.5.  Continue Eliquis  5 mg twice daily.    2.  Chronic diastolic heart failure:  She appears to be euvolemic on small dose furosemide .   3.  Hyperlipidemia: Previous lipid profile showed triglyceride of 562.  She is currently on fenofibrate  with most recent triglyceride of 112.  LDL was 132.   Disposition:   FU with me in 12 months  Signed,  Deatrice Cage, MD  05/29/2024 3:42 PM    [facility]

## 2024-05-29 NOTE — Patient Instructions (Signed)
 Medication Instructions:   Your physician recommends that you continue on your current medications as directed. Please refer to the Current Medication list given to you today.  *If you need a refill on your cardiac medications before your next appointment, please call your pharmacy*  Lab Work:  NONE  If you have labs (blood work) drawn today and your tests are completely normal, you will receive your results only by: MyChart Message (if you have MyChart) OR A paper copy in the mail If you have any lab test that is abnormal or we need to change your treatment, we will call you to review the results.  Testing/Procedures:  NONE  Follow-Up: At [facility], you and your health needs are our priority.  As part of our continuing mission to provide you with exceptional heart care, our providers are all part of one team.  This team includes your primary Cardiologist (physician) and Advanced Practice Providers or APPs (Physician Assistants and Nurse Practitioners) who all work together to provide you with the care you need, when you need it.  Your next appointment:   12 month(s)  Provider:   You may see Deatrice Cage, MD or one of the following Advanced Practice Providers on your designated Care Team:   Lonni Meager, NP Lesley Maffucci, PA-C Bernardino Bring, PA-C [facility], PA-C Tylene Lunch, NP Barnie Hila, NP   We recommend signing up for the patient portal called MyChart.  Sign up information is provided on this After Visit Summary.  MyChart is used to connect with patients for Virtual Visits (Telemedicine).  Patients are able to view lab/test results, encounter notes, upcoming appointments, etc.  Non-urgent messages can be sent to your provider as well.   To learn more about what you can do with MyChart, go to [email or web address].

## 2024-06-20 DIAGNOSIS — H6123 Impacted cerumen, bilateral: Secondary | ICD-10-CM | POA: Diagnosis not present

## 2024-06-20 DIAGNOSIS — H902 Conductive hearing loss, unspecified: Secondary | ICD-10-CM | POA: Diagnosis not present

## 2024-06-29 ENCOUNTER — Other Ambulatory Visit: Payer: Self-pay

## 2024-06-29 DIAGNOSIS — N289 Disorder of kidney and ureter, unspecified: Secondary | ICD-10-CM | POA: Diagnosis not present

## 2024-06-29 DIAGNOSIS — E875 Hyperkalemia: Secondary | ICD-10-CM | POA: Diagnosis not present

## 2024-06-30 LAB — COMPREHENSIVE METABOLIC PANEL WITH GFR
ALT: 31 IU/L (ref 0–32)
AST: 32 IU/L (ref 0–40)
Albumin: 4.2 g/dL (ref 3.7–4.7)
Alkaline Phosphatase: 53 IU/L (ref 44–121)
BUN/Creatinine Ratio: 20 (ref 12–28)
BUN: 24 mg/dL (ref 8–27)
Bilirubin Total: 0.6 mg/dL (ref 0.0–1.2)
CO2: 27 mmol/L (ref 20–29)
Calcium: 10.2 mg/dL (ref 8.7–10.3)
Chloride: 104 mmol/L (ref 96–106)
Creatinine, Ser: 1.2 mg/dL — ABNORMAL HIGH (ref 0.57–1.00)
Globulin, Total: 2.8 g/dL (ref 1.5–4.5)
Glucose: 111 mg/dL — ABNORMAL HIGH (ref 70–99)
Potassium: 4.3 mmol/L (ref 3.5–5.2)
Sodium: 146 mmol/L — ABNORMAL HIGH (ref 134–144)
Total Protein: 7 g/dL (ref 6.0–8.5)
eGFR: 45 mL/min/1.73 — ABNORMAL LOW (ref >59)

## 2024-07-02 ENCOUNTER — Ambulatory Visit: Payer: Self-pay | Admitting: Family Medicine

## 2024-07-03 ENCOUNTER — Other Ambulatory Visit: Payer: Self-pay

## 2024-07-03 MED ORDER — FENOFIBRATE 145 MG PO TABS: ORAL_TABLET | ORAL | 3 refills | Status: AC

## 2024-08-10 DIAGNOSIS — H401111 Primary open-angle glaucoma, right eye, mild stage: Secondary | ICD-10-CM | POA: Diagnosis not present

## 2024-08-10 DIAGNOSIS — H401123 Primary open-angle glaucoma, left eye, severe stage: Secondary | ICD-10-CM | POA: Diagnosis not present

## 2024-08-16 DIAGNOSIS — H401133 Primary open-angle glaucoma, bilateral, severe stage: Secondary | ICD-10-CM | POA: Diagnosis not present

## 2024-09-07 ENCOUNTER — Other Ambulatory Visit: Payer: Self-pay | Admitting: Cardiovascular Disease

## 2024-09-07 DIAGNOSIS — I482 Chronic atrial fibrillation, unspecified: Secondary | ICD-10-CM

## 2024-09-10 MED ORDER — METOPROLOL SUCCINATE ER 25 MG PO TB24: 12.5000 mg | ORAL_TABLET | Freq: Every day | ORAL | 2 refills | Status: AC

## 2024-09-13 ENCOUNTER — Other Ambulatory Visit: Payer: Self-pay | Admitting: Family Medicine

## 2024-09-18 ENCOUNTER — Ambulatory Visit (INDEPENDENT_AMBULATORY_CARE_PROVIDER_SITE_OTHER)

## 2024-09-18 DIAGNOSIS — Z23 Encounter for immunization: Secondary | ICD-10-CM | POA: Diagnosis not present

## 2024-11-06 ENCOUNTER — Other Ambulatory Visit: Payer: Self-pay | Admitting: Family Medicine

## 2024-11-06 DIAGNOSIS — G2581 Restless legs syndrome: Secondary | ICD-10-CM

## 2024-11-06 DIAGNOSIS — G629 Polyneuropathy, unspecified: Secondary | ICD-10-CM

## 2024-11-08 ENCOUNTER — Other Ambulatory Visit: Payer: Self-pay | Admitting: Family Medicine

## 2024-11-08 NOTE — Telephone Encounter (Signed)
 Please review in provider's absence.   LOV- 05/21/2024 NOV- None [facility]-05/21/2024 Outpatient Medication Detail   Disp Refills Start End   Fezolinetant  (VEOZAH ) 45 MG TABS 90 tablet 1 05/21/2024 --   Sig - Route: Take 1 tablet (45 mg total) by mouth daily. - Oral   Sent to pharmacy as: Fezolinetant  (VEOZAH ) 45 MG Tab   E-Prescribing Status: Receipt confirmed by pharmacy (05/21/2024 11:49 AM EDT

## 2024-12-13 ENCOUNTER — Other Ambulatory Visit: Payer: Self-pay | Admitting: Cardiovascular Disease
